# Patient Record
Sex: Male | Born: 1945
Health system: Southern US, Community
[De-identification: ages and names within clinical notes are randomized; demographics above are authoritative.]

## PROBLEM LIST (undated history)

## (undated) DIAGNOSIS — K219 Gastro-esophageal reflux disease without esophagitis: Secondary | ICD-10-CM

## (undated) DIAGNOSIS — E785 Hyperlipidemia, unspecified: Secondary | ICD-10-CM

## (undated) DIAGNOSIS — I10 Essential (primary) hypertension: Secondary | ICD-10-CM

## (undated) DIAGNOSIS — I2510 Atherosclerotic heart disease of native coronary artery without angina pectoris: Secondary | ICD-10-CM

## (undated) DIAGNOSIS — I6529 Occlusion and stenosis of unspecified carotid artery: Secondary | ICD-10-CM

## (undated) DIAGNOSIS — I639 Cerebral infarction, unspecified: Secondary | ICD-10-CM

## (undated) DIAGNOSIS — E669 Obesity, unspecified: Secondary | ICD-10-CM

## (undated) HISTORY — PX: TONSILLECTOMY: SHX5217

## (undated) HISTORY — DX: Occlusion and stenosis of unspecified carotid artery: I65.29

## (undated) HISTORY — PX: CORONARY ARTERY BYPASS GRAFT: SHX141

## (undated) HISTORY — DX: Cerebral infarction, unspecified: I63.9

## (undated) HISTORY — DX: Atherosclerotic heart disease of native coronary artery without angina pectoris: I25.10

## (undated) HISTORY — DX: Hyperlipidemia, unspecified: E78.5

## (undated) HISTORY — DX: Obesity, unspecified: E66.9

## (undated) HISTORY — DX: Gastro-esophageal reflux disease without esophagitis: K21.9

---

## 2006-03-26 ENCOUNTER — Inpatient Hospital Stay (HOSPITAL_COMMUNITY): Admission: EM | Admit: 2006-03-26 | Discharge: 2006-04-01 | Payer: Self-pay | Admitting: Emergency Medicine

## 2006-03-26 ENCOUNTER — Encounter (INDEPENDENT_AMBULATORY_CARE_PROVIDER_SITE_OTHER): Payer: Self-pay | Admitting: Cardiology

## 2006-03-26 ENCOUNTER — Ambulatory Visit: Payer: Self-pay | Admitting: Physical Medicine & Rehabilitation

## 2006-03-26 ENCOUNTER — Ambulatory Visit: Payer: Self-pay | Admitting: Pulmonary Disease

## 2006-04-01 ENCOUNTER — Ambulatory Visit: Payer: Self-pay | Admitting: Physical Medicine & Rehabilitation

## 2006-04-01 ENCOUNTER — Inpatient Hospital Stay (HOSPITAL_COMMUNITY)
Admission: RE | Admit: 2006-04-01 | Discharge: 2006-04-12 | Payer: Self-pay | Admitting: Physical Medicine & Rehabilitation

## 2006-05-05 ENCOUNTER — Encounter
Admission: RE | Admit: 2006-05-05 | Discharge: 2006-08-03 | Payer: Self-pay | Admitting: Physical Medicine & Rehabilitation

## 2006-05-05 ENCOUNTER — Ambulatory Visit: Payer: Self-pay | Admitting: Physical Medicine & Rehabilitation

## 2006-07-06 ENCOUNTER — Ambulatory Visit (HOSPITAL_COMMUNITY): Admission: RE | Admit: 2006-07-06 | Discharge: 2006-07-06 | Payer: Self-pay | Admitting: Interventional Radiology

## 2006-08-15 ENCOUNTER — Encounter
Admission: RE | Admit: 2006-08-15 | Discharge: 2006-11-13 | Payer: Self-pay | Admitting: Physical Medicine & Rehabilitation

## 2006-08-15 ENCOUNTER — Ambulatory Visit: Payer: Self-pay | Admitting: Physical Medicine & Rehabilitation

## 2009-01-09 ENCOUNTER — Encounter: Payer: Self-pay | Admitting: Cardiology

## 2009-05-12 ENCOUNTER — Encounter: Payer: Self-pay | Admitting: Cardiology

## 2009-06-19 ENCOUNTER — Encounter: Payer: Self-pay | Admitting: Cardiology

## 2009-06-20 ENCOUNTER — Ambulatory Visit: Payer: Self-pay | Admitting: Cardiovascular Disease

## 2009-06-20 ENCOUNTER — Ambulatory Visit: Payer: Self-pay | Admitting: Cardiology

## 2009-06-20 ENCOUNTER — Inpatient Hospital Stay (HOSPITAL_COMMUNITY): Admission: AD | Admit: 2009-06-20 | Discharge: 2009-07-02 | Payer: Self-pay | Admitting: Cardiology

## 2009-06-20 DIAGNOSIS — E785 Hyperlipidemia, unspecified: Secondary | ICD-10-CM | POA: Insufficient documentation

## 2009-06-20 DIAGNOSIS — K219 Gastro-esophageal reflux disease without esophagitis: Secondary | ICD-10-CM

## 2009-06-20 DIAGNOSIS — E669 Obesity, unspecified: Secondary | ICD-10-CM

## 2009-06-20 DIAGNOSIS — R079 Chest pain, unspecified: Secondary | ICD-10-CM | POA: Insufficient documentation

## 2009-06-20 DIAGNOSIS — Z8673 Personal history of transient ischemic attack (TIA), and cerebral infarction without residual deficits: Secondary | ICD-10-CM | POA: Insufficient documentation

## 2009-06-20 HISTORY — DX: Chest pain, unspecified: R07.9

## 2009-06-23 ENCOUNTER — Encounter: Payer: Self-pay | Admitting: Cardiothoracic Surgery

## 2009-06-23 ENCOUNTER — Encounter: Payer: Self-pay | Admitting: Cardiology

## 2009-06-23 ENCOUNTER — Ambulatory Visit: Payer: Self-pay | Admitting: Thoracic Surgery (Cardiothoracic Vascular Surgery)

## 2009-07-13 DIAGNOSIS — I25810 Atherosclerosis of coronary artery bypass graft(s) without angina pectoris: Secondary | ICD-10-CM | POA: Insufficient documentation

## 2009-07-14 ENCOUNTER — Ambulatory Visit: Payer: Self-pay | Admitting: Cardiology

## 2009-07-14 DIAGNOSIS — I4891 Unspecified atrial fibrillation: Secondary | ICD-10-CM | POA: Insufficient documentation

## 2009-07-14 LAB — CONVERTED CEMR LAB
AST: 41 units/L — ABNORMAL HIGH (ref 0–37)
BUN: 14 mg/dL (ref 6–23)
Basophils Absolute: 0.1 10*3/uL (ref 0.0–0.1)
Bilirubin, Direct: 0.1 mg/dL (ref 0.0–0.3)
Creatinine, Ser: 1.3 mg/dL (ref 0.4–1.5)
GFR calc non Af Amer: 58.99 mL/min (ref >60)
Glucose, Bld: 107 mg/dL — ABNORMAL HIGH (ref 70–99)
HCT: 34.7 % — ABNORMAL LOW (ref 39.0–52.0)
Lymphs Abs: 1 10*3/uL (ref 0.7–4.0)
Monocytes Absolute: 1 10*3/uL (ref 0.1–1.0)
Monocytes Relative: 7.4 % (ref 3.0–12.0)
Neutrophils Relative %: 83.5 % — ABNORMAL HIGH (ref 43.0–77.0)
Platelets: 377 10*3/uL (ref 150.0–400.0)
Potassium: 4.9 meq/L (ref 3.5–5.1)
RDW: 13.8 % (ref 11.5–14.6)
Total Bilirubin: 0.9 mg/dL (ref 0.3–1.2)

## 2009-07-18 ENCOUNTER — Inpatient Hospital Stay (HOSPITAL_COMMUNITY): Admission: AD | Admit: 2009-07-18 | Discharge: 2009-08-11 | Payer: Self-pay | Admitting: Cardiology

## 2009-07-18 ENCOUNTER — Ambulatory Visit: Payer: Self-pay | Admitting: Cardiology

## 2009-07-18 ENCOUNTER — Encounter: Payer: Self-pay | Admitting: Cardiology

## 2009-07-19 ENCOUNTER — Encounter: Payer: Self-pay | Admitting: Cardiology

## 2009-08-29 ENCOUNTER — Ambulatory Visit: Payer: Self-pay | Admitting: Thoracic Surgery (Cardiothoracic Vascular Surgery)

## 2009-08-29 ENCOUNTER — Encounter
Admission: RE | Admit: 2009-08-29 | Discharge: 2009-08-29 | Payer: Self-pay | Admitting: Thoracic Surgery (Cardiothoracic Vascular Surgery)

## 2009-08-29 ENCOUNTER — Encounter: Payer: Self-pay | Admitting: Cardiology

## 2009-09-08 ENCOUNTER — Ambulatory Visit: Payer: Self-pay | Admitting: Thoracic Surgery (Cardiothoracic Vascular Surgery)

## 2009-09-08 ENCOUNTER — Encounter: Payer: Self-pay | Admitting: Cardiology

## 2009-09-22 ENCOUNTER — Ambulatory Visit: Payer: Self-pay | Admitting: Thoracic Surgery (Cardiothoracic Vascular Surgery)

## 2009-09-22 ENCOUNTER — Ambulatory Visit: Payer: Self-pay | Admitting: Cardiology

## 2009-09-22 DIAGNOSIS — R21 Rash and other nonspecific skin eruption: Secondary | ICD-10-CM | POA: Insufficient documentation

## 2009-10-07 ENCOUNTER — Ambulatory Visit: Payer: Self-pay

## 2009-10-07 ENCOUNTER — Encounter: Payer: Self-pay | Admitting: Cardiology

## 2009-10-07 ENCOUNTER — Ambulatory Visit: Payer: Self-pay | Admitting: Cardiology

## 2009-10-07 ENCOUNTER — Ambulatory Visit (HOSPITAL_COMMUNITY): Admission: RE | Admit: 2009-10-07 | Discharge: 2009-10-07 | Payer: Self-pay | Admitting: Cardiology

## 2009-10-08 LAB — CONVERTED CEMR LAB
Albumin: 3.6 g/dL (ref 3.5–5.2)
Cholesterol: 135 mg/dL (ref 0–200)
LDL Cholesterol: 74 mg/dL (ref 0–99)
Total Protein: 7.5 g/dL (ref 6.0–8.3)
Triglycerides: 150 mg/dL — ABNORMAL HIGH (ref 0.0–149.0)
VLDL: 30 mg/dL (ref 0.0–40.0)

## 2009-10-31 ENCOUNTER — Telehealth: Payer: Self-pay | Admitting: Cardiology

## 2009-12-10 ENCOUNTER — Ambulatory Visit: Payer: Self-pay | Admitting: Cardiology

## 2010-04-29 ENCOUNTER — Telehealth (INDEPENDENT_AMBULATORY_CARE_PROVIDER_SITE_OTHER): Payer: Self-pay | Admitting: *Deleted

## 2010-09-17 ENCOUNTER — Encounter: Payer: Self-pay | Admitting: Cardiology

## 2010-09-17 LAB — PSA: PSA: 0.6 ng/mL

## 2010-09-25 ENCOUNTER — Encounter (INDEPENDENT_AMBULATORY_CARE_PROVIDER_SITE_OTHER): Payer: Self-pay | Admitting: *Deleted

## 2010-09-29 ENCOUNTER — Ambulatory Visit: Payer: Self-pay | Admitting: Cardiology

## 2010-10-12 ENCOUNTER — Telehealth: Payer: Self-pay | Admitting: Cardiology

## 2010-10-12 IMAGING — CR DG CHEST 1V PORT
1 series · 1 of 1 positions shown · non-contrast
Comparison: [DATE]

CLINICAL DATA: CABG

PORTABLE CHEST - 1 VIEW

[view not recorded]
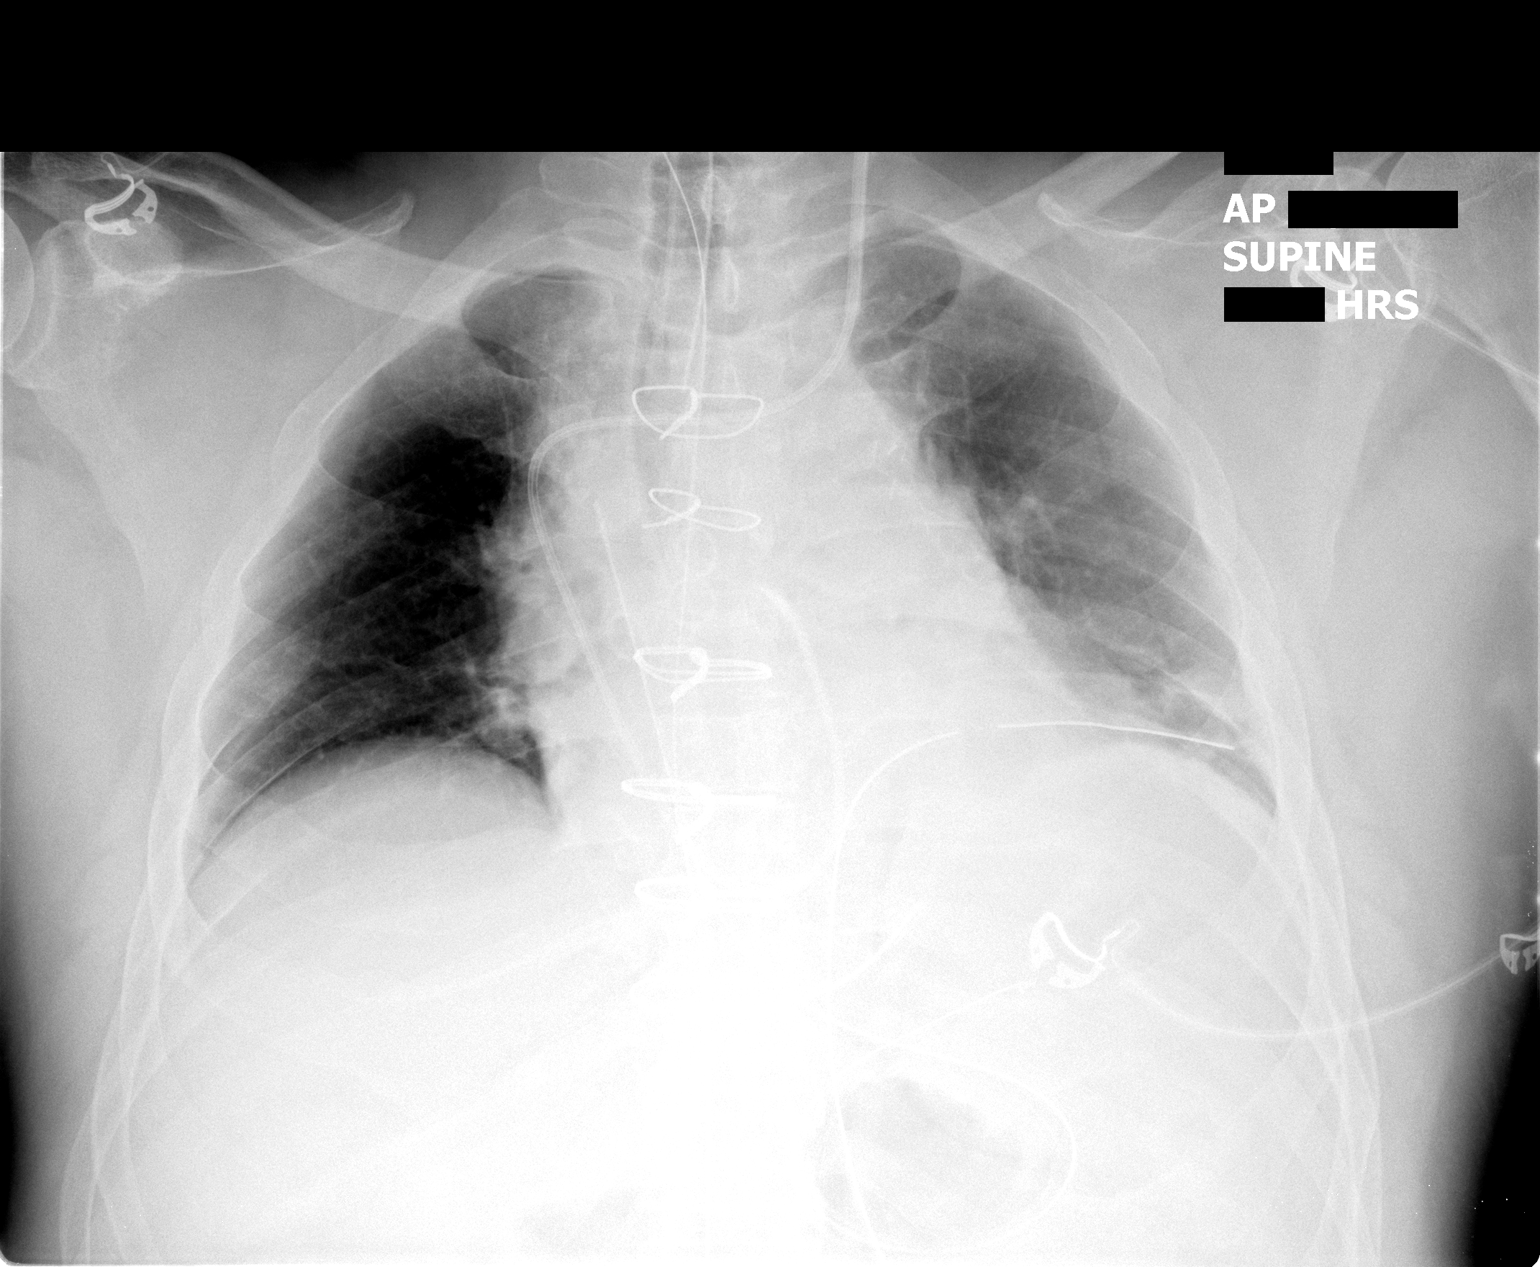

[1 of 1 positions shown; findings below may reference images not displayed]

FINDINGS: There is been median sternotomy and CABG.  Endotracheal
tube has its tip 3 cm above the carina.  Nasogastric tube enters
the stomach.  Swan-Ganz catheter has its tip in the main pulmonary
artery.  Left chest tubes in place.  There is no pneumothorax.
Mediastinal drains are in place.  There is mild atelectasis in the
left lower lobe.  No pulmonary edema.
IMPRESSION: Lines and tubes well positioned.  No pneumothorax.  Some volume
loss in the left lower lobe.

## 2010-10-12 IMAGING — CR DG CHEST 1V PORT
1 series · 1 of 1 positions shown · non-contrast
Comparison: 04/03/2006.

CLINICAL DATA: Status post cardiac catheterization.  Chest pain.

PORTABLE CHEST - 1 VIEW

[view not recorded]
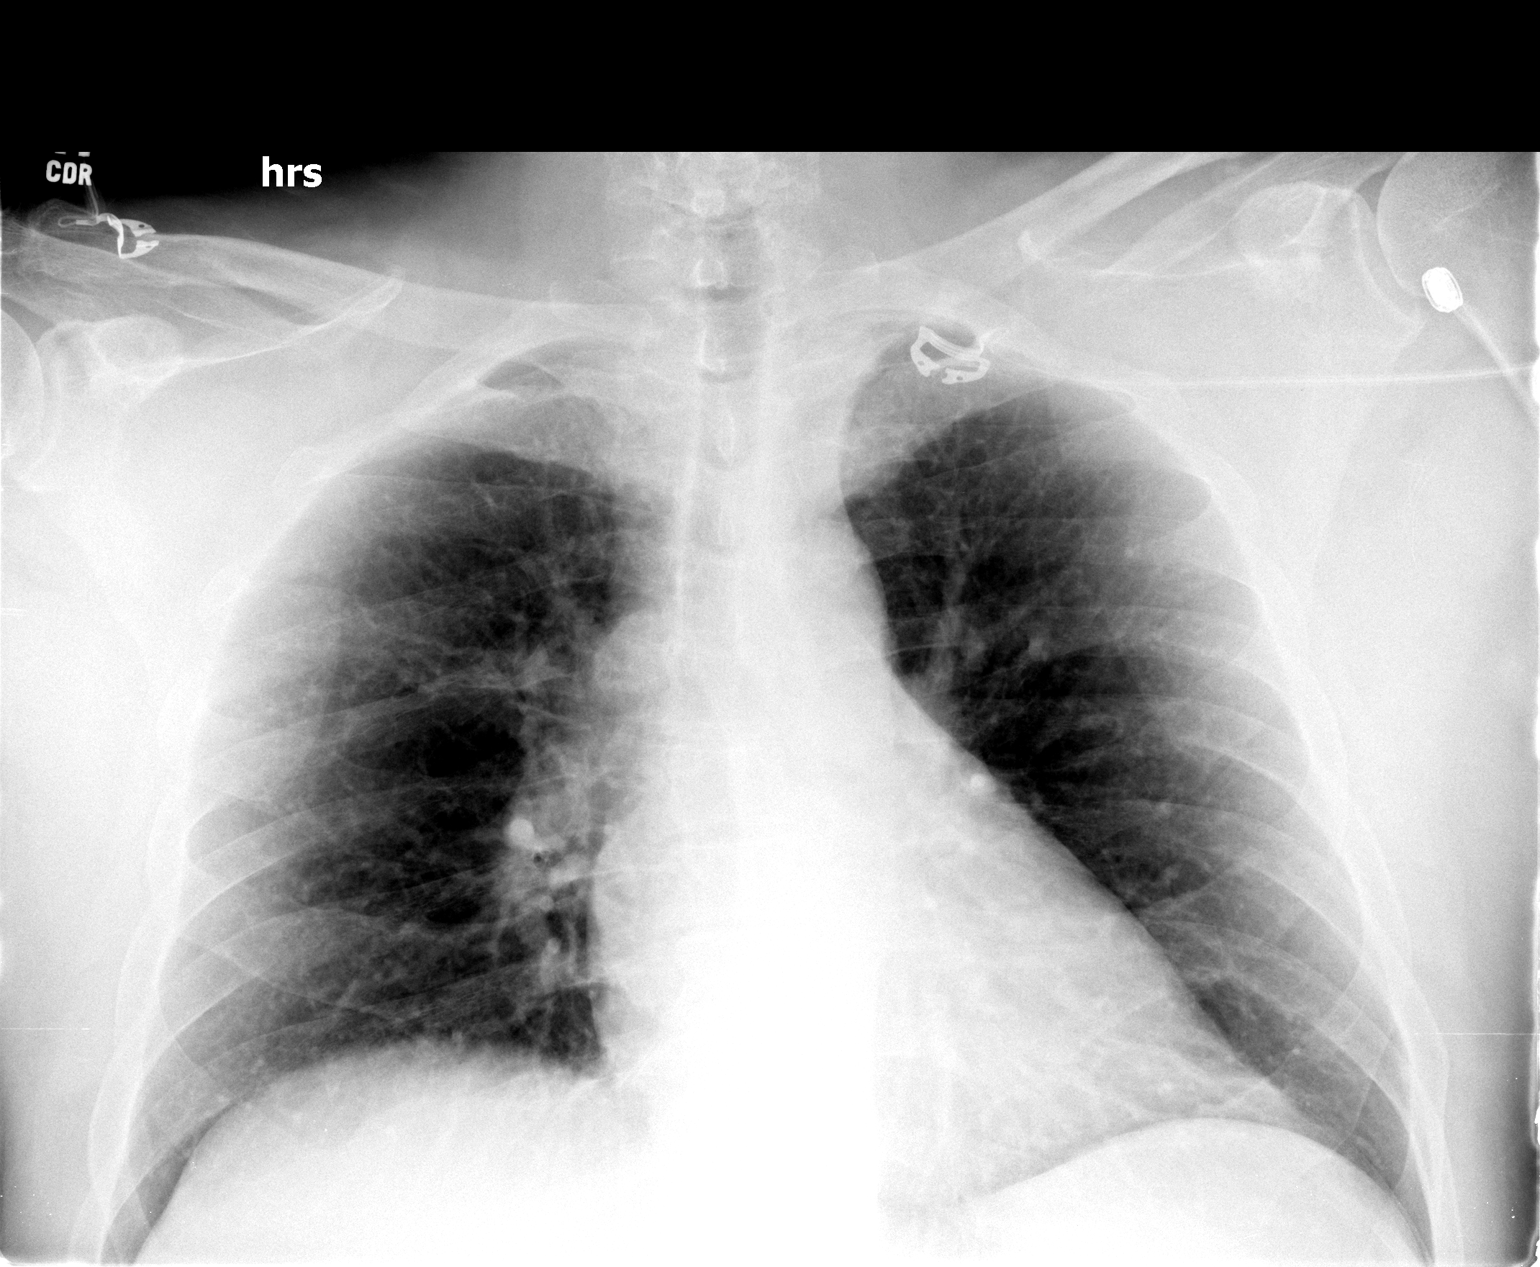

[1 of 1 positions shown; findings below may reference images not displayed]

FINDINGS: Central pulmonary vascular prominence.  No pulmonary
edema, segmental infiltrate or gross pneumothorax.  Heart size
within normal limits.  Prominence azygos region may represent
dilated azygos vein and can be evaluated on two-view chest with the
patient in the upright position when able.
IMPRESSION: Mild central pulmonary vascular prominence.

No infiltrate, congestive heart failure or pneumothorax. Please
see above.

## 2010-10-20 IMAGING — CR DG CHEST 2V
2 series · 2 of 2 positions shown · non-contrast
Comparison: Plain film chest 06/30/2009.

CLINICAL DATA: Chest pain.  Status post CABG.

CHEST - 2 VIEW

[w chest pa]
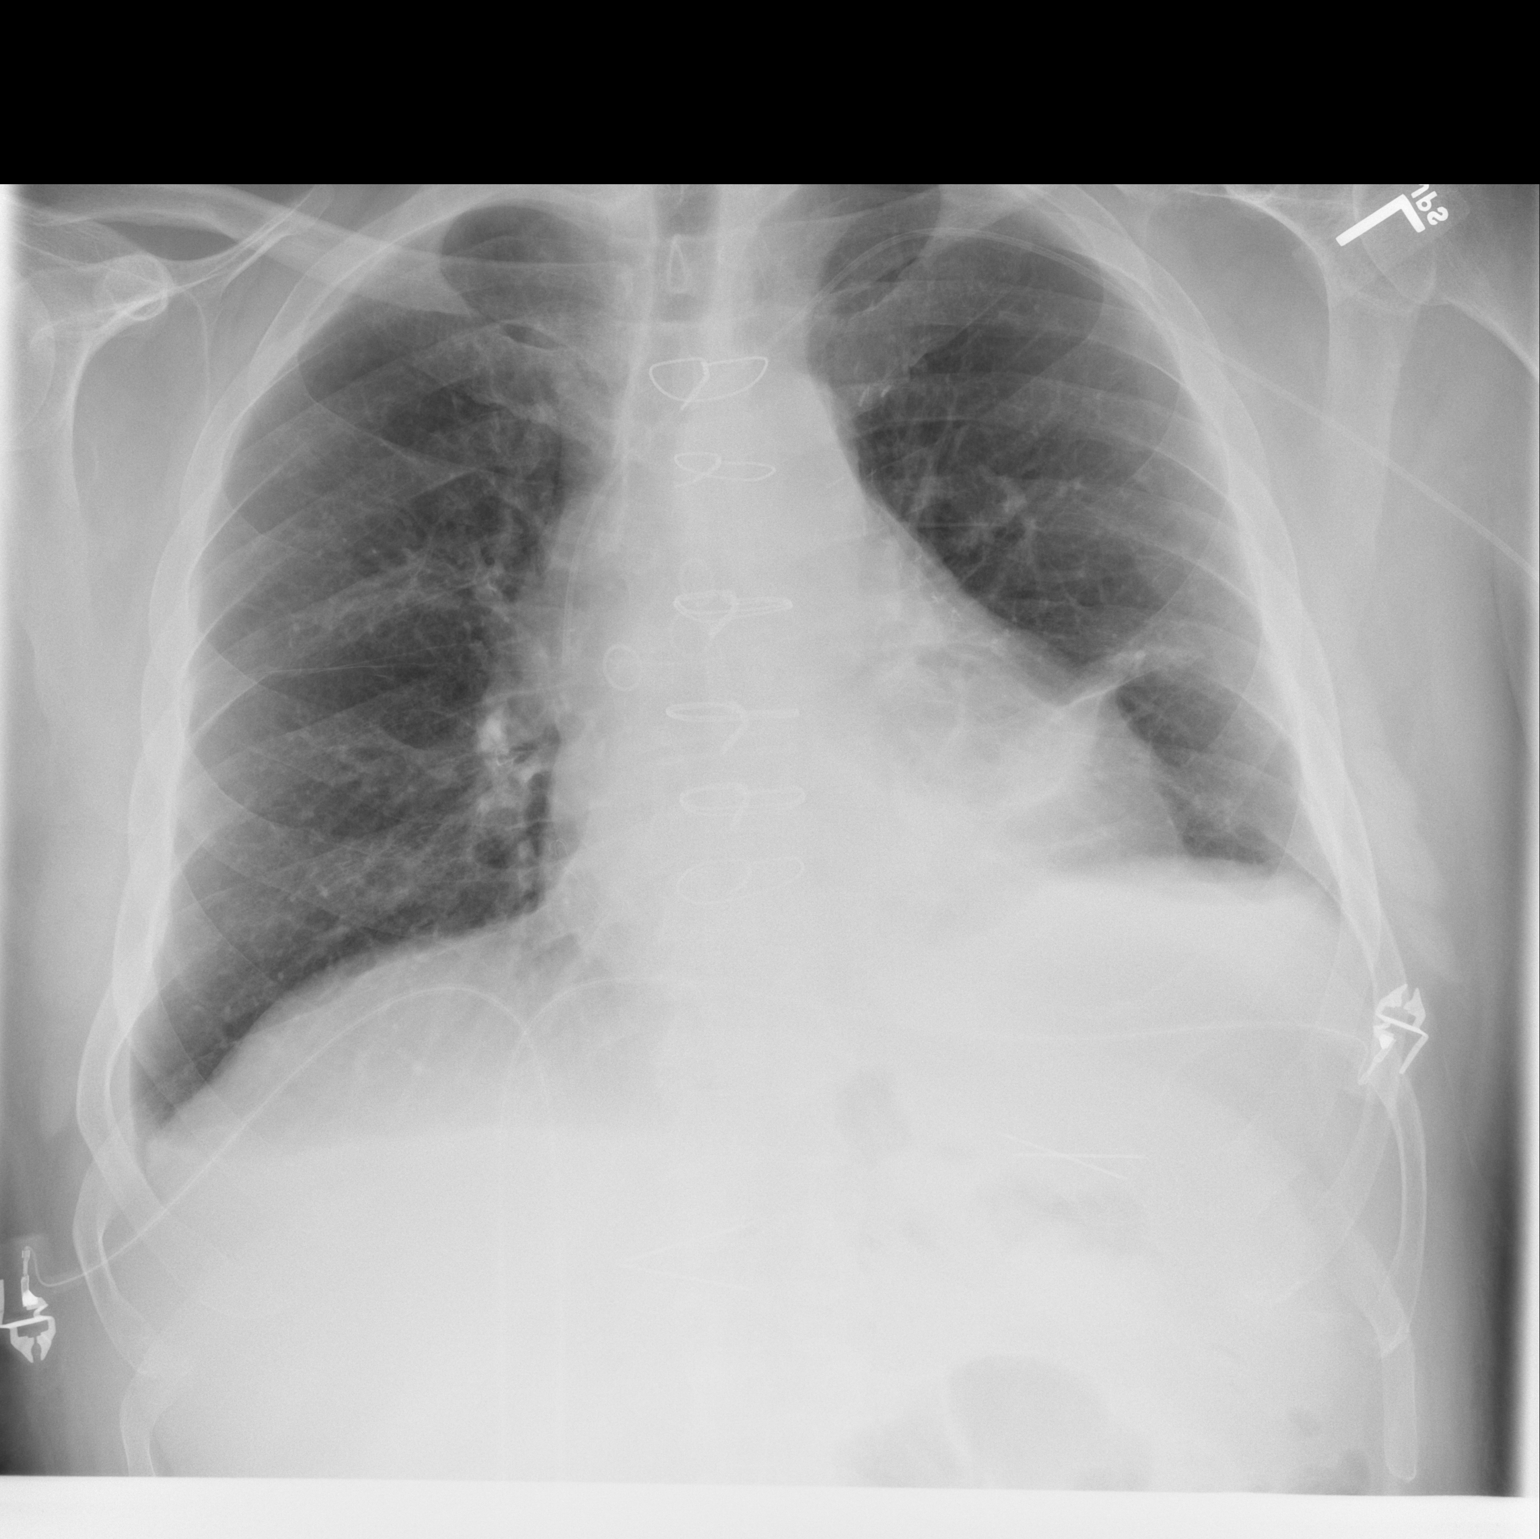

[w chest lat]
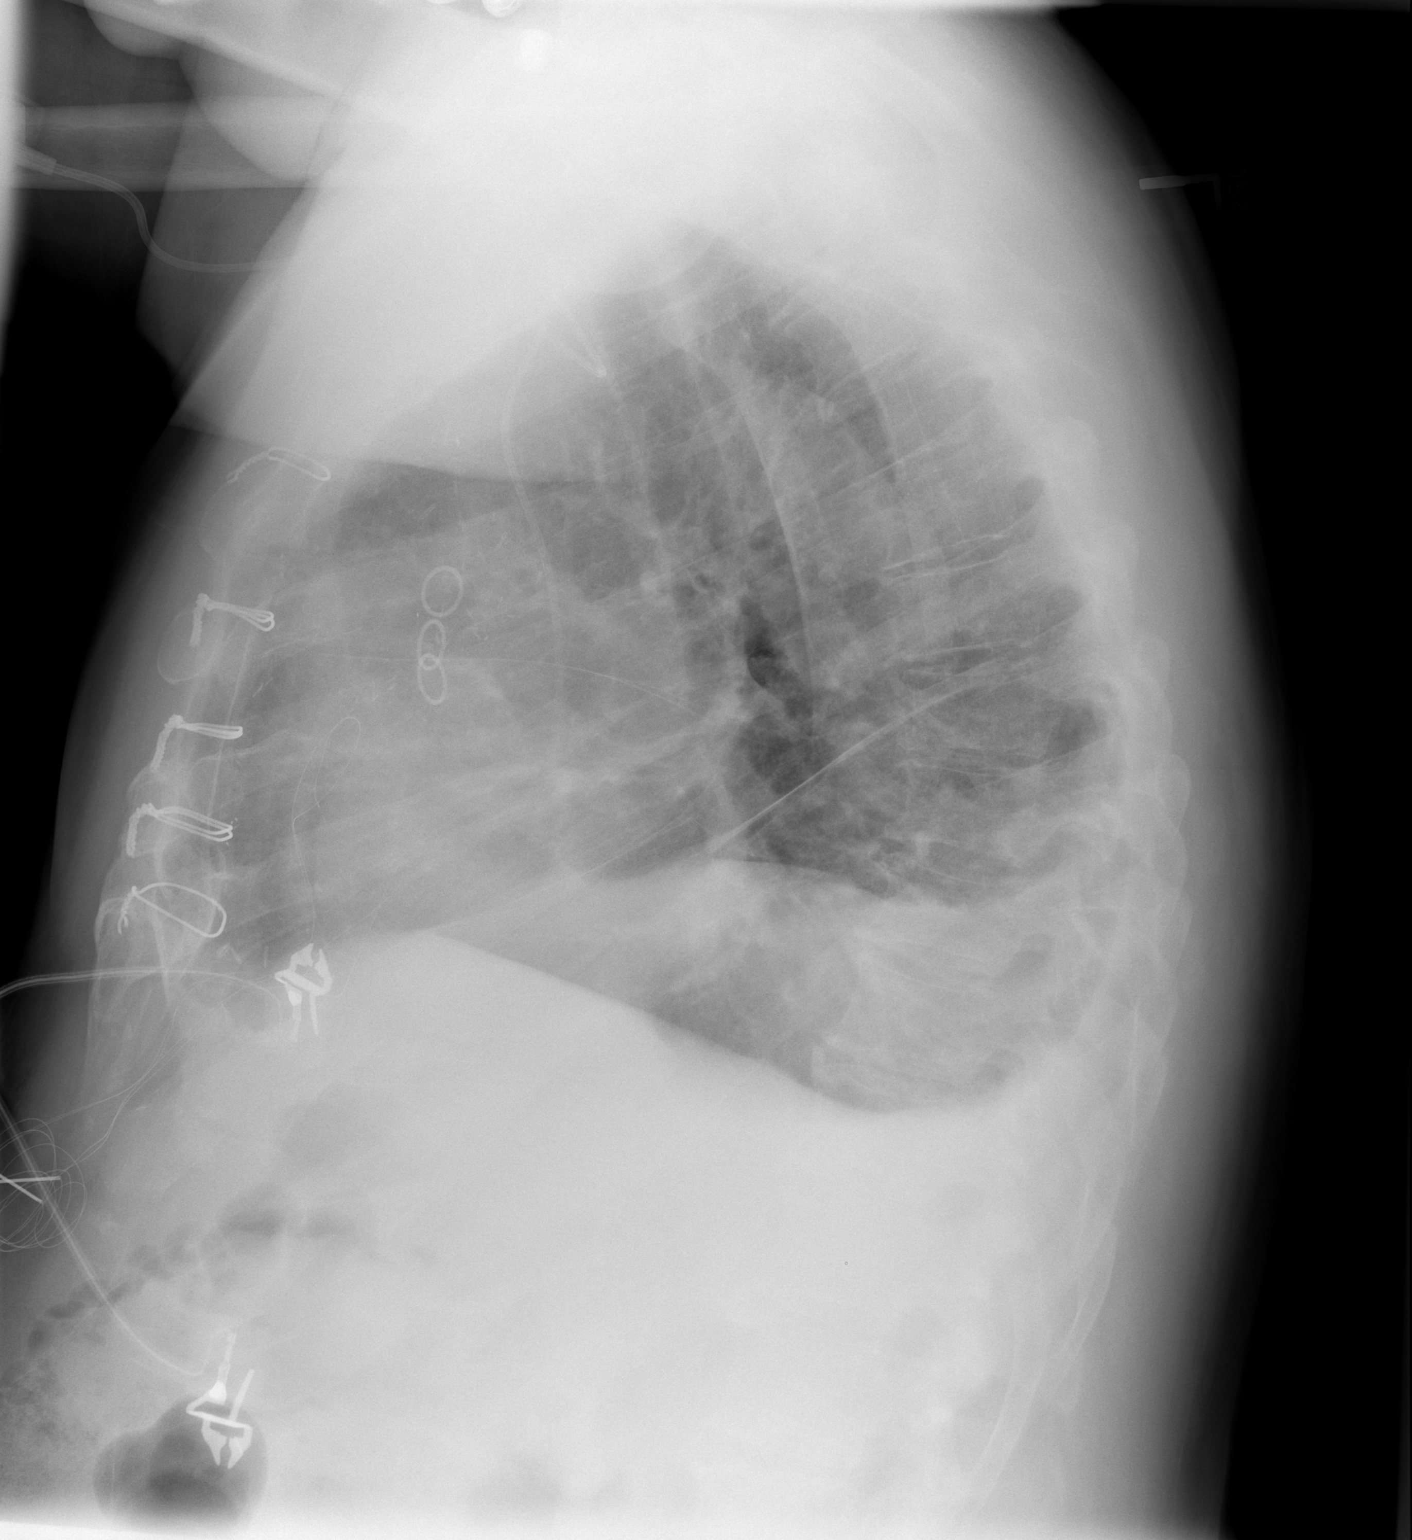

[2 of 2 positions shown; findings below may reference images not displayed]

FINDINGS: Left PICC remains in place.  The patient has small
bilateral pleural effusions and basilar atelectasis, both greater
on the left.  Heart size is upper normal.  No pulmonary edema.
IMPRESSION: Small bilateral pleural effusions and basilar atelectasis, greater
on the left.

## 2010-11-06 ENCOUNTER — Ambulatory Visit: Payer: Self-pay | Admitting: Gastroenterology

## 2010-11-06 ENCOUNTER — Encounter (INDEPENDENT_AMBULATORY_CARE_PROVIDER_SITE_OTHER): Payer: Self-pay | Admitting: *Deleted

## 2010-11-09 ENCOUNTER — Telehealth: Payer: Self-pay | Admitting: Gastroenterology

## 2010-11-09 IMAGING — CR DG CHEST 1V PORT
1 series · 1 of 1 positions shown · non-contrast
Comparison: 07/20/2009

CLINICAL DATA: CAD, sternal debridement.

PORTABLE CHEST - 1 VIEW

[view not recorded]
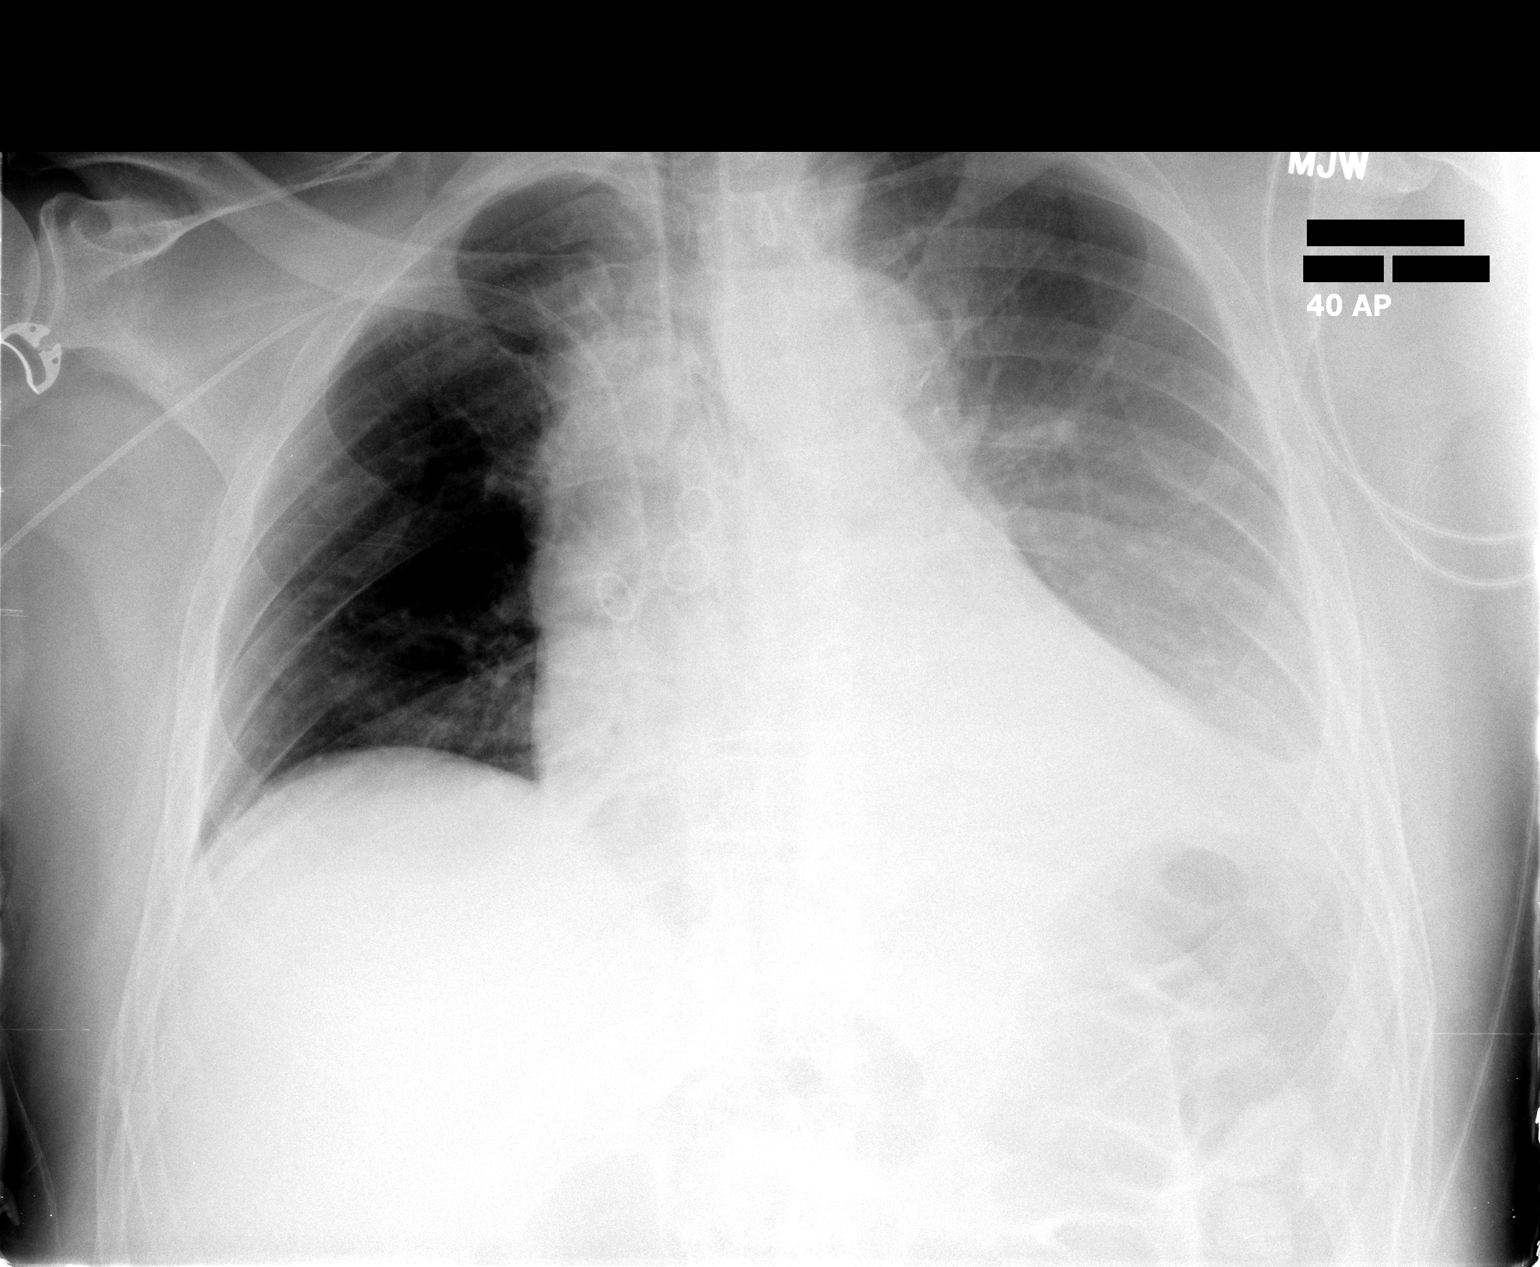

[1 of 1 positions shown; findings below may reference images not displayed]

FINDINGS: Right PICC line remains in place, unchanged.  There is
cardiomegaly.  Left lower lobe atelectasis or infiltrate present.
Small left effusion noted.  Right lung clear.  No significant
change since prior study.
IMPRESSION: No interval change.

## 2011-01-04 ENCOUNTER — Encounter: Payer: Self-pay | Admitting: Gastroenterology

## 2011-01-04 ENCOUNTER — Ambulatory Visit
Admission: RE | Admit: 2011-01-04 | Discharge: 2011-01-04 | Payer: Self-pay | Source: Home / Self Care | Attending: Gastroenterology | Admitting: Gastroenterology

## 2011-01-15 ENCOUNTER — Encounter: Payer: Self-pay | Admitting: Gastroenterology

## 2011-01-26 NOTE — Progress Notes (Signed)
Summary: RX REFILL  Phone Note Refill Request Message from:  Patient on October 12, 2010 9:51 AM  Refills Requested: Medication #1:  SIMVASTATIN 20 MG TABS 1 tab once daily [facility] # [id number]   Method Requested: Telephone to Pharmacy Initial call taken by: Roe Coombs,  October 12, 2010 9:51 AM  Follow-up for Phone Call        I called the pt and he would like #90 tablets. I have made him aware I would send this in. Follow-up by: Sherri Rad, RN, BSN,  October 12, 2010 12:12 PM    Prescriptions: SIMVASTATIN 20 MG TABS (SIMVASTATIN) 1 tab once daily  #90 x 3   Entered by:   Sherri Rad, RN, BSN   Authorized by:   Lenoria Farrier, MD, [facility]   Signed by:   Sherri Rad, RN, BSN on 10/12/2010   Method used:   Electronically to        [facility] (retail)       [address]       [place], [place]  [address]       Ph: [phone]       Fax: [phone]   RxID:   [id number]

## 2011-01-26 NOTE — Letter (Signed)
Summary: Anticoagulation/[facility]  Anticoagulation/[facility]   Imported By: Sherian Rein 11/13/2010 14:28:38  _____________________________________________________________________  External Attachment:    Type:   Image     Comment:   External Document

## 2011-01-26 NOTE — Assessment & Plan Note (Signed)
History of Present Illness Visit Type: Initial Consult Primary GI MD: Rob Bunting MD Primary Provider: Ancil Linsey Requesting Provider: Rudi Heap, MD Chief Complaint: Discuss having Colonoscopy. Pt is currently on Plavix. History of Present Illness:     very pleasant 65 year old man was sent by PCP to discuss colonoscopy. He has no troubles with bowels.  no fh of colon cancers.  2007 he had stroke.  he eventually had a carotid stent placed in his right neck. He has also had coronary artery bypass grafting in 2010.  he takes plavix since 2007.  He has not stopped it for any reason previously.             Current Medications (verified): 1)  Aspirin 81 Mg Tbec (Aspirin) .... Take One Tablet By Mouth Daily 2)  Simvastatin 20 Mg Tabs (Simvastatin) .Marland Kitchen.. 1 Tab Once Daily 3)  Metoprolol Succinate 25 Mg Xr24h-Tab (Metoprolol Succinate) .... Take One Tablet By Mouth Daily 4)  Citalopram Hydrobromide 40 Mg Tabs (Citalopram Hydrobromide) .... 1/2 By Mouth Daily 5)  Plavix 75 Mg Tabs (Clopidogrel Bisulfate) .... One Tablet By Mouth Once Daily 6)  Vitamin D3 400 Unit Tabs (Cholecalciferol) .... One Tablet By Mouth Once Daily  Allergies (verified): 1)  ! Lipitor (Atorvastatin) 2)  ! * Ivp Dye  Past History:  Past Medical History: . History of previous stroke and right carotid stenting in 2007 2. CAD S/P NSTEMI and ECABGS for LM and 3v CAD 06/2009 3. Staph mediastinal infection requiring surgery 07/2009 4.  Hyperlipidemia 5.  Post-op AF   Past Surgical History: coronary artery bypass grafting  Family History: his father and brother both had myocardial infarctions.    Social History: Tobacco Use - Former. 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 He is married, 2 children  Review of Systems       Pertinent positive and negative review of systems were noted in the above HPI and GI specific review of systems.  All other review of  systems was otherwise negative.   Vital Signs:  Patient profile:   65 year old male Height:      70 inches Weight:      229.50 pounds BMI:     33.05 Pulse rate:   76 / minute Pulse rhythm:   regular BP sitting:   136 / 78  (left arm) Cuff size:   regular  Vitals Entered By: Christie Nottingham CMA Duncan Dull) (November 06, 2010 10:19 AM)  Physical Exam  Additional Exam:  Constitutional: generally well appearing Psychiatric: alert and oriented times 3 Eyes: extraocular movements intact Mouth: oropharynx moist, no lesions Neck: supple, no lymphadenopathy Cardiovascular: heart regular rate and rythm Lungs: CTA bilaterally Abdomen: soft, non-tender, non-distended, no obvious ascites, no peritoneal signs, normal bowel sounds Extremities: no lower extremity edema bilaterally Skin: no lesions on visible extremities    Impression & Recommendations:  Problem # 1:  routine risk for colon cancer he takes Plavix since a stroke in 2007. This puts him at increased risk for bleeding complications during any procedures and so we will contact his primary care physician about him holding the Plavix for 5 days prior to scheduling a colonoscopy. I see no reason for any further blood tests or imaging studies.  Patient Instructions: 1)  We will contact Birdena Jubilee at Dr. Kathi Der office about you holding your plavix for 5 days prior to colonoscopy. 2)  You will be scheduled to have a colonoscopy. 3)  The medication list was reviewed and reconciled.  All changed / newly prescribed medications were explained.  A complete medication list was provided to the patient / caregiver.  Appended Document: Orders Update/movi    Clinical Lists Changes  Problems: Added new problem of SPECIAL SCREENING FOR MALIGNANT NEOPLASMS COLON (ICD-V76.51) Medications: Added new medication of MOVIPREP 100 GM  SOLR (PEG-KCL-NACL-NASULF-NA ASC-C) As per prep instructions. - Signed Rx of MOVIPREP 100 GM  SOLR  (PEG-KCL-NACL-NASULF-NA ASC-C) As per prep instructions.;  #1 x 0;  Signed;  Entered by: Chales Abrahams CMA (AAMA);  Authorized by: Rachael Fee MD;  Method used: Electronically to [facility] [address], [place], [place], [place]  [address], Ph: [phone], Fax: [phone] Orders: Added new Test order of Colonoscopy (Colon) - Signed    Prescriptions: MOVIPREP 100 GM  SOLR (PEG-KCL-NACL-NASULF-NA ASC-C) As per prep instructions.  #1 x 0   Entered by:   Chales Abrahams CMA (AAMA)   Authorized by:   Rachael Fee MD   Signed by:   Chales Abrahams CMA (AAMA) on 11/06/2010   Method used:   Electronically to        [facility] (retail)       [address]       [place], [place]  [address]       Ph: [phone]       Fax: [phone]   RxID:   [id number]

## 2011-01-26 NOTE — Letter (Signed)
Summary: [facility] Instructions  [facility]  [address] [place], [place] [address]   Phone: [phone]  Fax: [phone]       [name]    Aug 09, 1946    MRN: [id number]        Procedure Day /Date:01/04/11 MON     Arrival Time:1030 am     Procedure Time:1130 am     Location of Procedure:                    X  [facility] (4th Floor)                        PREPARATION FOR COLONOSCOPY WITH MOVIPREP   Starting 5 days prior to your procedure 12/30/10  do not eat nuts, seeds, popcorn, corn, beans, peas,  salads, or any raw vegetables.  Do not take any fiber supplements (e.g. Metamucil, Citrucel, and Benefiber).  THE DAY BEFORE YOUR PROCEDURE         DATE: 01/03/11  DAY: SUN  1.  Drink clear liquids the entire day-NO SOLID FOOD  2.  Do not drink anything colored red or purple.  Avoid juices with pulp.  No orange juice.  3.  Drink at least 64 oz. (8 glasses) of fluid/clear liquids during the day to prevent dehydration and help the prep work efficiently.  CLEAR LIQUIDS INCLUDE: Water Jello Ice Popsicles Tea (sugar ok, no milk/cream) Powdered fruit flavored drinks Coffee (sugar ok, no milk/cream) Gatorade Juice: apple, white grape, white cranberry  Lemonade Clear bullion, consomm, broth Carbonated beverages (any kind) Strained chicken noodle soup Hard Candy                             4.  In the morning, mix first dose of MoviPrep solution:    Empty 1 Pouch A and 1 Pouch B into the disposable container    Add lukewarm drinking water to the top line of the container. Mix to dissolve    Refrigerate (mixed solution should be used within 24 hrs)  5.  Begin drinking the prep at 5:00 p.m. The MoviPrep container is divided by 4 marks.   Every 15 minutes drink the solution down to the next mark (approximately 8 oz) until the full liter is complete.   6.  Follow completed prep with 16 oz of clear liquid of your choice (Nothing red or purple).   Continue to drink clear liquids until bedtime.  7.  Before going to bed, mix second dose of MoviPrep solution:    Empty 1 Pouch A and 1 Pouch B into the disposable container    Add lukewarm drinking water to the top line of the container. Mix to dissolve    Refrigerate  THE DAY OF YOUR PROCEDURE      DATE: 01/04/11  DAY: MON  Beginning at 630 a.m. (5 hours before procedure):         1. Every 15 minutes, drink the solution down to the next mark (approx 8 oz) until the full liter is complete.  2. Follow completed prep with 16 oz. of clear liquid of your choice.    3. You may drink clear liquids until 930 am (2 HOURS BEFORE PROCEDURE).   MEDICATION INSTRUCTIONS  Unless otherwise instructed, you should take regular prescription medications with a small sip of water   as early as possible the morning of your procedure.  Stop taking Plavix or Aggrenox on  12/28/10  (7 days before procedure).    We have contacted [name].    Additional medication instructions: You will be contacted by our office prior to your procedure for directions on holding your Plavix.  If you do not hear from our office 1 week prior to your scheduled procedure, please call [phone] to discuss.          OTHER INSTRUCTIONS  You will need a responsible adult at least 66 years of age to accompany you and drive you home.   This person must remain in the waiting room during your procedure.  Wear loose fitting clothing that is easily removed.  Leave jewelry and other valuables at home.  However, you may wish to bring a book to read or  an iPod/MP3 player to listen to music as you wait for your procedure to start.  Remove all body piercing jewelry and leave at home.  Total time from sign-in until discharge is approximately 2-3 hours.  You should go home directly after your procedure and rest.  You can resume normal activities the  day after your procedure.  The day of your procedure you should  not:   Drive   Make legal decisions   Operate machinery   Drink alcohol   Return to work  You will receive specific instructions about eating, activities and medications before you leave.    The above instructions have been reviewed and explained to me by   _______________________    I fully understand and can verbalize these instructions _____________________________ Date _________

## 2011-01-26 NOTE — Letter (Signed)
Summary: Anticoagulation Modification Letter  [facility]  [address] [place], [place] [address]   Phone: [phone]  Fax: [phone]    November 06, 2010  Re:    [name] DOB:    1946/12/25 MRN:    [id number]    Dear [name],  We have scheduled the above patient for an endoscopic procedure. Our records show that  he/she is on anticoagulation therapy. Please advise as to how long the patient may come off their therapy of plavix prior to the scheduled procedure(s) on 01/04/2011.   Please fax back/or route the completed form to Patty at  [phone]  Thank you for your help with this matter.  Sincerely,  Chales Abrahams CMA Duncan Dull)   Physician Recommendation:  Hold Plavix 7 days prior ________________  Hold Coumadin 5 days prior ____________  Other ______________________________     Appended Document: Anticoagulation Modification Letter response recieved pt aware  letter scanned to EMR

## 2011-01-26 NOTE — Letter (Signed)
Summary: New Patient letter  [facility]  [address] [place], [place] [address]   Phone: [phone]  Fax: [phone]       09/25/2010 MRN: [id number]  [facility] [address] [place], [place]  [address]  Dear Mr. Stockard,  Welcome to the Gastroenterology Division at [facility].    You are scheduled to see Dr.  Christella Hartigan on 11-06-10 at 11:00a.m. on the 3rd floor at [facility], [address]  We ask that you try to arrive at our office 15 minutes prior to your appointment time to allow for check-in.  We would like you to complete the enclosed self-administered evaluation form prior to your visit and bring it with you on the day of your appointment.  We will review it with you.  Also, please bring a complete list of all your medications or, if you prefer, bring the medication bottles and we will list them.  Please bring your insurance card so that we may make a copy of it.  If your insurance requires a referral to see a specialist, please bring your referral form from your primary care physician.  Co-payments are due at the time of your visit and may be paid by cash, check or credit card.     Your office visit will consist of a consult with your physician (includes a physical exam), any laboratory testing he/she may order, scheduling of any necessary diagnostic testing (e.g. x-ray, ultrasound, CT-scan), and scheduling of a procedure (e.g. Endoscopy, Colonoscopy) if required.  Please allow enough time on your schedule to allow for any/all of these possibilities.    If you cannot keep your appointment, please call [phone] to cancel or reschedule prior to your appointment date.  This allows [place] the opportunity to schedule an appointment for another patient in need of care.  If you do not cancel or reschedule by 5 p.m. the business day prior to your appointment date, you will be charged a $50.00 late cancellation/no-show fee.    Thank you for choosing  [facility] for your medical needs.  We appreciate the opportunity to care for you.  Please visit [place] at our website  to learn more about our practice.                     Sincerely,                                                             The Gastroenterology Division

## 2011-01-26 NOTE — Progress Notes (Signed)
Summary: prep request  Phone Note Call from Patient Call back at Home Phone [phone]   Caller: Patient Call For: Dr. Christella Hartigan Reason for Call: Talk to Nurse Summary of Call: [facility] in [place]... would like a cheaper prep Initial call taken by: Vallarie Mare,  November 09, 2010 8:48 AM  Follow-up for Phone Call        pt to pick up sample Movi prep at front desk Follow-up by: Chales Abrahams CMA Duncan Dull),  November 09, 2010 8:57 AM

## 2011-01-26 NOTE — Progress Notes (Signed)
Summary: refill  Phone Note Refill Request Message from:  Patient on Apr 29, 2010 4:12 PM  Refills Requested: Medication #1:  PLAVIX 75 MG TABS 1 tab once daily   Supply Requested: 3 months [facility] [place]   Method Requested: Fax to Local Pharmacy Initial call taken by: Migdalia Dk,  Apr 29, 2010 4:13 PM  Follow-up for Phone Call        Rx faxed to pharmacy Follow-up by: Oswald Hillock,  Apr 29, 2010 4:15 PM    Prescriptions: PLAVIX 75 MG TABS (CLOPIDOGREL BISULFATE) 1 tab once daily  #30 x 6   Entered by:   Oswald Hillock   Authorized by:   Lenoria Farrier, MD, [facility]   Signed by:   Oswald Hillock on 04/29/2010   Method used:   Faxed to ...       [facility] (retail)       [address]       [place], [place]  [address]       Ph: [phone]       Fax: [phone]   RxID:   [id number]

## 2011-01-26 NOTE — Assessment & Plan Note (Signed)
Summary: rov   Primary Langford Carias:  [name]   History of Present Illness: [name] is 65 years old and return for management of CAD. In July of 2010 he had a non-ST elevation MI with CABG 4 he developed a mediastinal infection requiring repeat surgery. He's done quite well since that time and hasn't had chest pain shortness of breath or palpitations.  in 2007 he had a carotid stent to the right carotid. Had a mild stroke before that. He has very little residual. He also has hyperlipidemia.  Current Medications (verified): 1)  Plavix 75 Mg Tabs (Clopidogrel Bisulfate) .Marland Kitchen.. 1 Tab Once Daily 2)  Aspirin 81 Mg Tbec (Aspirin) .... Take One Tablet By Mouth Daily 3)  Simvastatin 20 Mg Tabs (Simvastatin) .Marland Kitchen.. 1 Tab Once Daily 4)  Metoprolol Succinate 25 Mg Xr24h-Tab (Metoprolol Succinate) .... Take One Tablet By Mouth Daily 5)  Citalopram Hydrobromide 40 Mg Tabs (Citalopram Hydrobromide) .... 1/2 By Mouth Daily  Allergies (verified): 1)  ! Lipitor (Atorvastatin) 2)  ! * Ivp Dye  Past History:  Past Medical History: Reviewed history from 12/09/2009 and no changes required. CHEST PAIN-UNSPECIFIED (ICD-786.50) OBESITY (ICD-278.00) ACID REFLUX DISEASE (ICD-530.81)   1. History of previous stroke and right carotid stenting in 2007 2. CAD S/P NSTEMI and ECABGS for LM and 3v CAD 06/2009 3. Staph mediastinal infection requiring surgery 07/2009 4.  Hyperlipidemia 5.  Post-op AF  Review of Systems       ROS is negative except as outlined in HPI.   Vital Signs:  Patient profile:   65 year old male Height:      70 inches Weight:      227 pounds BMI:     32.69 Pulse rate:   74 / minute Resp:     16 per minute BP sitting:   107 / 63  (right arm)  Vitals Entered By: Marrion Coy, CNA (September 29, 2010 1:35 PM)  Physical Exam  Additional Exam:  Gen. Well-nourished, in no distress   Neck: No JVD, thyroid not enlarged, no carotid bruits Lungs: No tachypnea, clear without rales,  rhonchi or wheezes Cardiovascular: Rhythm regular, PMI not displaced,  heart sounds  normal, no murmurs or gallops, no peripheral edema, pulses normal in all 4 extremities. Abdomen: BS normal, abdomen soft and non-tender without masses or organomegaly, no hepatosplenomegaly. MS: No deformities, no cyanosis or clubbing   Neuro:  No focal sns   Skin:  no lesions    Impression & Recommendations:  Problem # 1:  CAD, AUTOLOGOUS BYPASS GRAFT (ICD-414.02) He had bypass surgery in July of 2010. He's had no chest pain this prompted stable. He is on Plavix for 2 reasons. He had acute cardiac syndrome prior to his bypass surgery. He also had a carotid stent in 2007. I think at this point the Plavix can be discontinued. We will continue aspirin.  The following medications were removed from the medication list:    Plavix 75 Mg Tabs (Clopidogrel bisulfate) .Marland Kitchen... 1 tab once daily His updated medication list for this problem includes:    Aspirin 81 Mg Tbec (Aspirin) .Marland Kitchen... Take one tablet by mouth daily    Metoprolol Succinate 25 Mg Xr24h-tab (Metoprolol succinate) .Marland Kitchen... Take one tablet by mouth daily  Orders: EKG w/ Interpretation (93000)  Problem # 2:  HYPERLIPIDEMIA-MIXED (ICD-272.4) This is managed with simvastatin and he had a recent lipid profile with his primary care physician. His updated medication list for this problem includes:    Simvastatin  20 Mg Tabs (Simvastatin) .Marland Kitchen... 1 tab once daily  Problem # 3:  CVA (ICD-434.91) He had a previous stroke and was treated with TPA and stent in 2007. He has very little residual. His been on aspirin and Plavix since that time but I don't think he needs the Plavix from this point forward. The following medications were removed from the medication list:    Plavix 75 Mg Tabs (Clopidogrel bisulfate) .Marland Kitchen... 1 tab once daily His updated medication list for this problem includes:    Aspirin 81 Mg Tbec (Aspirin) .Marland Kitchen... Take one tablet by mouth daily  Patient  Instructions: 1)  Stop Plavix. 2)  Your physician wants you to follow-up in:  1 year with Dr. Antoine Poche in our [place] office. You will receive a reminder letter in the mail two months in advance. If you don't receive a letter, please call our office to schedule the follow-up appointment.

## 2011-01-28 NOTE — Procedures (Addendum)
Summary: Colonoscopy  Patient: [name] Note: All result statuses are Final unless otherwise noted.  Tests: (1) Colonoscopy (COL)   COL Colonoscopy           DONE     [facility]     [address]     [place], [place]  [address]           COLONOSCOPY PROCEDURE REPORT           PATIENT:  [name], [name]  MR#:  [id number]     BIRTHDATE:  1946/05/01, 64 yrs. old  GENDER:  male     ENDOSCOPIST:  Rachael Fee, MD     REF. BY:  Rudi Heap, M.D.     PROCEDURE DATE:  01/04/2011     PROCEDURE:  Colonoscopy with biopsy     ASA CLASS:  Class II     INDICATIONS:  Routine Risk Screening     MEDICATIONS:   Fentanyl 50 mcg IV, Versed 6 mg IV           DESCRIPTION OF PROCEDURE:   After the risks benefits and     alternatives of the procedure were thoroughly explained, informed     consent was obtained.  Digital rectal exam was performed and     revealed no rectal masses.   The LB [id number]-[id number] endoscope     was introduced through the anus and advanced to the cecum, which     was identified by both the appendix and ileocecal valve, without     limitations.  The quality of the prep was good, using MoviPrep.     The instrument was then slowly withdrawn as the colon was fully     examined.     <<PROCEDUREIMAGES>>     FINDINGS:  A diminutive polyp was found in the mid transverse     colon. This was removed with forceps and sent to pathology (jar 1)     (see image4).  Melanosis coli was found throughout the colon(see     image1).  This was otherwise a normal examination of the colon     (see image2, image3, and image5).   Retroflexed views in the     rectum revealed no abnormalities.    The scope was then withdrawn     from the patient and the procedure completed.     COMPLICATIONS:  None           ENDOSCOPIC IMPRESSION:     1) Diminutive polyp in the mid transverse colon, removed and     sent to pathology.     2) Melanosis staining of colon (from chronic laxative use).       RECOMMENDATIONS:     1) If the polyp(s) removed today are proven to be adenomatous     (pre-cancerous) polyps, you will need a repeat colonoscopy in 5     years. Otherwise you should continue to follow colorectal cancer     screening guidelines for "routine risk" patients with colonoscopy     in 10 years.     2) You will receive a letter within 1-2 weeks with the results     of your biopsy as well as final recommendations. Please call my     office if you have not received a letter after 3 weeks.           ______________________________     Rachael Fee, MD  n.     eSIGNED:   Rachael Fee at 01/04/2011 12:02 PM           [name], [id number]  Note: An exclamation mark (!) indicates a result that was not dispersed into the flowsheet. Document Creation Date: 01/04/2011 12:05 PM _______________________________________________________________________  (1) Order result status: Final Collection or observation date-time: 01/04/2011 11:54 Requested date-time:  Receipt date-time:  Reported date-time:  Referring Physician:   Ordering Physician: Rob Bunting [phone]) Specimen Source:  Source: Launa Grill Order Number: [id number] Lab site:   Appended Document: Colonoscopy     Procedures Next Due Date:    Colonoscopy: 12/2020

## 2011-01-28 NOTE — Letter (Addendum)
Summary: Results Letter  [facility]  [address] [place], [place] [address]   Phone: [phone]  Fax: [phone]        January 15, 2011 MRN: [id number]    [facility] [address] [place], [place]  [address]    Dear Mr. Lievanos,   Good news.  The polyp that was removed during your recent procedure was NOT pre-cancerous.  You should continue to follow current colorectal cancer screening guidelines with a repeat colonoscopy in 10 years. You should contact your primary care MD around that time to schedule one.    Sincerely,  Rachael Fee MD  This letter has been electronically signed by your physician.  Appended Document: Results Letter Letter mailed

## 2011-03-17 ENCOUNTER — Encounter: Payer: Self-pay | Admitting: Nurse Practitioner

## 2011-04-03 LAB — CROSSMATCH: Antibody Screen: NEGATIVE

## 2011-04-03 LAB — BASIC METABOLIC PANEL
BUN: 11 mg/dL (ref 6–23)
BUN: 6 mg/dL (ref 6–23)
Calcium: 8.5 mg/dL (ref 8.4–10.5)
Calcium: 8.6 mg/dL (ref 8.4–10.5)
Calcium: 8.7 mg/dL (ref 8.4–10.5)
Chloride: 102 mEq/L (ref 96–112)
Chloride: 107 mEq/L (ref 96–112)
Creatinine, Ser: 0.81 mg/dL (ref 0.4–1.5)
Creatinine, Ser: 0.82 mg/dL (ref 0.4–1.5)
Creatinine, Ser: 0.95 mg/dL (ref 0.4–1.5)
GFR calc Af Amer: >60 mL/min (ref >60)
GFR calc Af Amer: >60 mL/min (ref >60)
GFR calc non Af Amer: >60 mL/min (ref >60)
GFR calc non Af Amer: >60 mL/min (ref >60)
GFR calc non Af Amer: >60 mL/min (ref >60)
GFR calc non Af Amer: >60 mL/min (ref >60)
Glucose, Bld: 112 mg/dL — ABNORMAL HIGH (ref 70–99)
Potassium: 4.2 mEq/L (ref 3.5–5.1)
Sodium: 133 mEq/L — ABNORMAL LOW (ref 135–145)
Sodium: 136 mEq/L (ref 135–145)

## 2011-04-03 LAB — CBC
HCT: 26.1 % — ABNORMAL LOW (ref 39.0–52.0)
HCT: 31.7 % — ABNORMAL LOW (ref 39.0–52.0)
Hemoglobin: 10.4 g/dL — ABNORMAL LOW (ref 13.0–17.0)
Hemoglobin: 8.5 g/dL — ABNORMAL LOW (ref 13.0–17.0)
MCHC: 33.2 g/dL (ref 30.0–36.0)
MCV: 87.9 fL (ref 78.0–100.0)
MCV: 88 fL (ref 78.0–100.0)
MCV: 88.4 fL (ref 78.0–100.0)
MCV: 89 fL (ref 78.0–100.0)
Platelets: 255 10*3/uL (ref 150–400)
Platelets: 280 10*3/uL (ref 150–400)
Platelets: 350 10*3/uL (ref 150–400)
RDW: 16.8 % — ABNORMAL HIGH (ref 11.5–15.5)
WBC: 11.3 10*3/uL — ABNORMAL HIGH (ref 4.0–10.5)
WBC: 11.7 10*3/uL — ABNORMAL HIGH (ref 4.0–10.5)
WBC: 6.2 10*3/uL (ref 4.0–10.5)

## 2011-04-03 LAB — MRSA PCR SCREENING: MRSA by PCR: NEGATIVE

## 2011-04-03 LAB — TISSUE CULTURE
Culture: NO GROWTH
Gram Stain: NONE SEEN

## 2011-04-03 LAB — VANCOMYCIN, TROUGH
Vancomycin Tr: 19.6 ug/mL (ref 10.0–20.0)
Vancomycin Tr: 12.7 ug/mL (ref 10.0–20.0)
Vancomycin Tr: 16.2 ug/mL (ref 10.0–20.0)
Vancomycin Tr: 29.9 ug/mL (ref 10.0–20.0)
Vancomycin Tr: 41.8 ug/mL (ref 10.0–20.0)

## 2011-04-04 LAB — CBC
HCT: 34.6 % — ABNORMAL LOW (ref 39.0–52.0)
HCT: 23.2 % — ABNORMAL LOW (ref 39.0–52.0)
HCT: 26.3 % — ABNORMAL LOW (ref 39.0–52.0)
HCT: 27.3 % — ABNORMAL LOW (ref 39.0–52.0)
HCT: 27.6 % — ABNORMAL LOW (ref 39.0–52.0)
HCT: 28.5 % — ABNORMAL LOW (ref 39.0–52.0)
HCT: 29.2 % — ABNORMAL LOW (ref 39.0–52.0)
HCT: 30.5 % — ABNORMAL LOW (ref 39.0–52.0)
Hemoglobin: 9.7 g/dL — ABNORMAL LOW (ref 13.0–17.0)
Hemoglobin: 10.1 g/dL — ABNORMAL LOW (ref 13.0–17.0)
Hemoglobin: 10.6 g/dL — ABNORMAL LOW (ref 13.0–17.0)
Hemoglobin: 9.4 g/dL — ABNORMAL LOW (ref 13.0–17.0)
Hemoglobin: 9.7 g/dL — ABNORMAL LOW (ref 13.0–17.0)
Hemoglobin: 9.8 g/dL — ABNORMAL LOW (ref 13.0–17.0)
MCHC: 33.4 g/dL (ref 30.0–36.0)
MCHC: 33.7 g/dL (ref 30.0–36.0)
MCHC: 34.2 g/dL (ref 30.0–36.0)
MCHC: 34.4 g/dL (ref 30.0–36.0)
MCHC: 34.6 g/dL (ref 30.0–36.0)
MCHC: 34.8 g/dL (ref 30.0–36.0)
MCHC: 35 g/dL (ref 30.0–36.0)
MCV: 90.2 fL (ref 78.0–100.0)
MCV: 90.7 fL (ref 78.0–100.0)
MCV: 92 fL (ref 78.0–100.0)
MCV: 92.1 fL (ref 78.0–100.0)
MCV: 93.3 fL (ref 78.0–100.0)
MCV: 93.3 fL (ref 78.0–100.0)
MCV: 94 fL (ref 78.0–100.0)
Platelets: 314 10*3/uL (ref 150–400)
Platelets: 332 10*3/uL (ref 150–400)
Platelets: 124 10*3/uL — ABNORMAL LOW (ref 150–400)
Platelets: 213 10*3/uL (ref 150–400)
Platelets: 229 10*3/uL (ref 150–400)
Platelets: 246 10*3/uL (ref 150–400)
RBC: 3.2 MIL/uL — ABNORMAL LOW (ref 4.22–5.81)
RBC: 2.52 MIL/uL — ABNORMAL LOW (ref 4.22–5.81)
RBC: 2.93 MIL/uL — ABNORMAL LOW (ref 4.22–5.81)
RBC: 3 MIL/uL — ABNORMAL LOW (ref 4.22–5.81)
RBC: 3.03 MIL/uL — ABNORMAL LOW (ref 4.22–5.81)
RBC: 3.13 MIL/uL — ABNORMAL LOW (ref 4.22–5.81)
RDW: 14.6 % (ref 11.5–15.5)
RDW: 13.8 % (ref 11.5–15.5)
RDW: 14.3 % (ref 11.5–15.5)
RDW: 14.3 % (ref 11.5–15.5)
RDW: 14.4 % (ref 11.5–15.5)
RDW: 14.5 % (ref 11.5–15.5)
WBC: 7.9 10*3/uL (ref 4.0–10.5)
WBC: 8.8 10*3/uL (ref 4.0–10.5)
WBC: 8.9 10*3/uL (ref 4.0–10.5)
WBC: 12.1 10*3/uL — ABNORMAL HIGH (ref 4.0–10.5)
WBC: 12.4 10*3/uL — ABNORMAL HIGH (ref 4.0–10.5)
WBC: 17.3 10*3/uL — ABNORMAL HIGH (ref 4.0–10.5)
WBC: 17.4 10*3/uL — ABNORMAL HIGH (ref 4.0–10.5)
WBC: 19 10*3/uL — ABNORMAL HIGH (ref 4.0–10.5)

## 2011-04-04 LAB — BASIC METABOLIC PANEL
BUN: 8 mg/dL (ref 6–23)
BUN: 8 mg/dL (ref 6–23)
BUN: 19 mg/dL (ref 6–23)
BUN: 27 mg/dL — ABNORMAL HIGH (ref 6–23)
BUN: 36 mg/dL — ABNORMAL HIGH (ref 6–23)
CO2: 24 mEq/L (ref 19–32)
CO2: 26 mEq/L (ref 19–32)
CO2: 26 mEq/L (ref 19–32)
CO2: 27 mEq/L (ref 19–32)
CO2: 27 mEq/L (ref 19–32)
CO2: 30 mEq/L (ref 19–32)
Calcium: 8.4 mg/dL (ref 8.4–10.5)
Calcium: 8.4 mg/dL (ref 8.4–10.5)
Calcium: 7.9 mg/dL — ABNORMAL LOW (ref 8.4–10.5)
Calcium: 8 mg/dL — ABNORMAL LOW (ref 8.4–10.5)
Calcium: 8 mg/dL — ABNORMAL LOW (ref 8.4–10.5)
Chloride: 101 mEq/L (ref 96–112)
Chloride: 104 mEq/L (ref 96–112)
Chloride: 104 mEq/L (ref 96–112)
Chloride: 104 mEq/L (ref 96–112)
Chloride: 107 mEq/L (ref 96–112)
Chloride: 107 mEq/L (ref 96–112)
Chloride: 107 mEq/L (ref 96–112)
Chloride: 107 mEq/L (ref 96–112)
Chloride: 109 mEq/L (ref 96–112)
Creatinine, Ser: 0.73 mg/dL (ref 0.4–1.5)
Creatinine, Ser: 0.8 mg/dL (ref 0.4–1.5)
Creatinine, Ser: 0.86 mg/dL (ref 0.4–1.5)
Creatinine, Ser: 0.9 mg/dL (ref 0.4–1.5)
Creatinine, Ser: 0.94 mg/dL (ref 0.4–1.5)
Creatinine, Ser: 1.03 mg/dL (ref 0.4–1.5)
Creatinine, Ser: 1.33 mg/dL (ref 0.4–1.5)
GFR calc Af Amer: >60 mL/min (ref >60)
GFR calc Af Amer: >60 mL/min (ref >60)
GFR calc Af Amer: >60 mL/min (ref >60)
GFR calc Af Amer: >60 mL/min (ref >60)
GFR calc Af Amer: >60 mL/min (ref >60)
GFR calc Af Amer: >60 mL/min (ref >60)
GFR calc Af Amer: >60 mL/min (ref >60)
GFR calc Af Amer: >60 mL/min (ref >60)
GFR calc non Af Amer: >60 mL/min (ref >60)
GFR calc non Af Amer: >60 mL/min (ref >60)
GFR calc non Af Amer: >60 mL/min (ref >60)
GFR calc non Af Amer: >60 mL/min (ref >60)
Glucose, Bld: 98 mg/dL (ref 70–99)
Glucose, Bld: 107 mg/dL — ABNORMAL HIGH (ref 70–99)
Glucose, Bld: 125 mg/dL — ABNORMAL HIGH (ref 70–99)
Glucose, Bld: 93 mg/dL (ref 70–99)
Glucose, Bld: 98 mg/dL (ref 70–99)
Potassium: 3.8 mEq/L (ref 3.5–5.1)
Potassium: 3.4 mEq/L — ABNORMAL LOW (ref 3.5–5.1)
Potassium: 3.8 mEq/L (ref 3.5–5.1)
Potassium: 4 mEq/L (ref 3.5–5.1)
Potassium: 4.2 mEq/L (ref 3.5–5.1)
Potassium: 4.3 mEq/L (ref 3.5–5.1)
Sodium: 139 mEq/L (ref 135–145)
Sodium: 138 mEq/L (ref 135–145)
Sodium: 139 mEq/L (ref 135–145)
Sodium: 140 mEq/L (ref 135–145)
Sodium: 141 mEq/L (ref 135–145)
Sodium: 145 mEq/L (ref 135–145)

## 2011-04-04 LAB — URINALYSIS, MICROSCOPIC ONLY
Leukocytes, UA: NEGATIVE
Nitrite: NEGATIVE
Specific Gravity, Urine: 1.008 (ref 1.005–1.030)
pH: 7 (ref 5.0–8.0)

## 2011-04-04 LAB — CULTURE, ROUTINE-ABSCESS

## 2011-04-04 LAB — COMPREHENSIVE METABOLIC PANEL
AST: 31 U/L (ref 0–37)
Albumin: 2.9 g/dL — ABNORMAL LOW (ref 3.5–5.2)
Alkaline Phosphatase: 30 U/L — ABNORMAL LOW (ref 39–117)
BUN: 35 mg/dL — ABNORMAL HIGH (ref 6–23)
Chloride: 108 mEq/L (ref 96–112)
GFR calc Af Amer: >60 mL/min (ref >60)
Potassium: 3.8 mEq/L (ref 3.5–5.1)
Total Bilirubin: 1 mg/dL (ref 0.3–1.2)
Total Protein: 5.5 g/dL — ABNORMAL LOW (ref 6.0–8.3)

## 2011-04-04 LAB — POCT I-STAT, CHEM 8
Creatinine, Ser: 1.4 mg/dL (ref 0.4–1.5)
Glucose, Bld: 119 mg/dL — ABNORMAL HIGH (ref 70–99)
HCT: 26 % — ABNORMAL LOW (ref 39.0–52.0)
Hemoglobin: 8.8 g/dL — ABNORMAL LOW (ref 13.0–17.0)
Sodium: 144 mEq/L (ref 135–145)
TCO2: 28 mmol/L (ref 0–100)

## 2011-04-04 LAB — MAGNESIUM: Magnesium: 2.5 mg/dL (ref 1.5–2.5)

## 2011-04-04 LAB — GLUCOSE, CAPILLARY
Glucose-Capillary: 107 mg/dL — ABNORMAL HIGH (ref 70–99)
Glucose-Capillary: 119 mg/dL — ABNORMAL HIGH (ref 70–99)
Glucose-Capillary: 122 mg/dL — ABNORMAL HIGH (ref 70–99)
Glucose-Capillary: 123 mg/dL — ABNORMAL HIGH (ref 70–99)
Glucose-Capillary: 126 mg/dL — ABNORMAL HIGH (ref 70–99)
Glucose-Capillary: 127 mg/dL — ABNORMAL HIGH (ref 70–99)
Glucose-Capillary: 137 mg/dL — ABNORMAL HIGH (ref 70–99)
Glucose-Capillary: 140 mg/dL — ABNORMAL HIGH (ref 70–99)

## 2011-04-04 LAB — URINE CULTURE

## 2011-04-04 LAB — AMYLASE: Amylase: 41 U/L (ref 27–131)

## 2011-04-04 LAB — ANAEROBIC CULTURE

## 2011-04-04 LAB — VANCOMYCIN, TROUGH
Vancomycin Tr: 18.1 ug/mL (ref 10.0–20.0)
Vancomycin Tr: 21.5 ug/mL — ABNORMAL HIGH (ref 10.0–20.0)

## 2011-04-05 LAB — URINALYSIS, ROUTINE W REFLEX MICROSCOPIC
Bilirubin Urine: NEGATIVE
Glucose, UA: NEGATIVE mg/dL
Hgb urine dipstick: NEGATIVE
Ketones, ur: NEGATIVE mg/dL
Nitrite: NEGATIVE
Protein, ur: NEGATIVE mg/dL
Specific Gravity, Urine: >1.046 — ABNORMAL HIGH (ref 1.005–1.030)
Urobilinogen, UA: 1 mg/dL (ref 0.0–1.0)
pH: 6.5 (ref 5.0–8.0)

## 2011-04-05 LAB — POCT I-STAT 3, ART BLOOD GAS (G3+)
Acid-base deficit: 2 mmol/L (ref 0.0–2.0)
Acid-base deficit: 2 mmol/L (ref 0.0–2.0)
Acid-base deficit: 2 mmol/L (ref 0.0–2.0)
Acid-base deficit: 3 mmol/L — ABNORMAL HIGH (ref 0.0–2.0)
Acid-base deficit: 4 mmol/L — ABNORMAL HIGH (ref 0.0–2.0)
Bicarbonate: 21.1 mEq/L (ref 20.0–24.0)
Bicarbonate: 21.2 mEq/L (ref 20.0–24.0)
Bicarbonate: 21.2 mEq/L (ref 20.0–24.0)
Bicarbonate: 22.2 mEq/L (ref 20.0–24.0)
Bicarbonate: 23 mEq/L (ref 20.0–24.0)
Bicarbonate: 23 mEq/L (ref 20.0–24.0)
O2 Saturation: 100 %
O2 Saturation: 83 %
O2 Saturation: 86 %
O2 Saturation: 89 %
O2 Saturation: 92 %
O2 Saturation: 93 %
O2 Saturation: 97 %
Patient temperature: 36.1
Patient temperature: 36.6
Patient temperature: 36.8
Patient temperature: 98
TCO2: 22 mmol/L (ref 0–100)
TCO2: 22 mmol/L (ref 0–100)
TCO2: 22 mmol/L (ref 0–100)
TCO2: 23 mmol/L (ref 0–100)
TCO2: 24 mmol/L (ref 0–100)
TCO2: 24 mmol/L (ref 0–100)
TCO2: 26 mmol/L (ref 0–100)
pCO2 arterial: 29.3 mmHg — ABNORMAL LOW (ref 35.0–45.0)
pCO2 arterial: 32.3 mmHg — ABNORMAL LOW (ref 35.0–45.0)
pCO2 arterial: 32.9 mmHg — ABNORMAL LOW (ref 35.0–45.0)
pCO2 arterial: 36 mmHg (ref 35.0–45.0)
pCO2 arterial: 41.9 mmHg (ref 35.0–45.0)
pH, Arterial: 7.364 (ref 7.350–7.450)
pH, Arterial: 7.365 (ref 7.350–7.450)
pH, Arterial: 7.381 (ref 7.350–7.450)
pH, Arterial: 7.412 (ref 7.350–7.450)
pO2, Arterial: 47 mmHg — ABNORMAL LOW (ref 80.0–100.0)
pO2, Arterial: 51 mmHg — ABNORMAL LOW (ref 80.0–100.0)
pO2, Arterial: 57 mmHg — ABNORMAL LOW (ref 80.0–100.0)
pO2, Arterial: 64 mmHg — ABNORMAL LOW (ref 80.0–100.0)
pO2, Arterial: 67 mmHg — ABNORMAL LOW (ref 80.0–100.0)
pO2, Arterial: 69 mmHg — ABNORMAL LOW (ref 80.0–100.0)
pO2, Arterial: 74 mmHg — ABNORMAL LOW (ref 80.0–100.0)
pO2, Arterial: 85 mmHg (ref 80.0–100.0)

## 2011-04-05 LAB — POCT I-STAT, CHEM 8
Calcium, Ion: 1.2 mmol/L (ref 1.12–1.32)
Chloride: 106 mEq/L (ref 96–112)
HCT: 24 % — ABNORMAL LOW (ref 39.0–52.0)

## 2011-04-05 LAB — BASIC METABOLIC PANEL
BUN: 16 mg/dL (ref 6–23)
BUN: 17 mg/dL (ref 6–23)
BUN: 28 mg/dL — ABNORMAL HIGH (ref 6–23)
CO2: 23 mEq/L (ref 19–32)
Calcium: 8.2 mg/dL — ABNORMAL LOW (ref 8.4–10.5)
Chloride: 112 mEq/L (ref 96–112)
Creatinine, Ser: 0.9 mg/dL (ref 0.4–1.5)
Creatinine, Ser: 0.97 mg/dL (ref 0.4–1.5)
Creatinine, Ser: 1.3 mg/dL (ref 0.4–1.5)
GFR calc non Af Amer: 56 mL/min — ABNORMAL LOW (ref >60)
GFR calc non Af Amer: >60 mL/min (ref >60)
Glucose, Bld: 128 mg/dL — ABNORMAL HIGH (ref 70–99)
Glucose, Bld: 151 mg/dL — ABNORMAL HIGH (ref 70–99)
Glucose, Bld: 153 mg/dL — ABNORMAL HIGH (ref 70–99)
Potassium: 4.3 mEq/L (ref 3.5–5.1)
Sodium: 140 mEq/L (ref 135–145)

## 2011-04-05 LAB — HEPARIN LEVEL (UNFRACTIONATED)
Heparin Unfractionated: 0.56 IU/mL (ref 0.30–0.70)
Heparin Unfractionated: 0.71 IU/mL — ABNORMAL HIGH (ref 0.30–0.70)

## 2011-04-05 LAB — CROSSMATCH: Antibody Screen: NEGATIVE

## 2011-04-05 LAB — POCT I-STAT 3, VENOUS BLOOD GAS (G3P V)
pCO2, Ven: 47.6 mmHg (ref 45.0–50.0)
pH, Ven: 7.36 — ABNORMAL HIGH (ref 7.250–7.300)

## 2011-04-05 LAB — CBC
HCT: 23.9 % — ABNORMAL LOW (ref 39.0–52.0)
HCT: 25 % — ABNORMAL LOW (ref 39.0–52.0)
HCT: 36.4 % — ABNORMAL LOW (ref 39.0–52.0)
HCT: 41.3 % (ref 39.0–52.0)
HCT: 44.5 % (ref 39.0–52.0)
Hemoglobin: 12.8 g/dL — ABNORMAL LOW (ref 13.0–17.0)
Hemoglobin: 15.1 g/dL (ref 13.0–17.0)
Hemoglobin: 15.3 g/dL (ref 13.0–17.0)
Hemoglobin: 8.5 g/dL — ABNORMAL LOW (ref 13.0–17.0)
MCHC: 33.8 g/dL (ref 30.0–36.0)
MCHC: 34.5 g/dL (ref 30.0–36.0)
MCHC: 34.7 g/dL (ref 30.0–36.0)
MCHC: 35.1 g/dL (ref 30.0–36.0)
MCHC: 35.4 g/dL (ref 30.0–36.0)
MCV: 90.6 fL (ref 78.0–100.0)
MCV: 91.1 fL (ref 78.0–100.0)
MCV: 91.1 fL (ref 78.0–100.0)
MCV: 91.2 fL (ref 78.0–100.0)
Platelets: 146 10*3/uL — ABNORMAL LOW (ref 150–400)
Platelets: 154 10*3/uL (ref 150–400)
Platelets: 226 10*3/uL (ref 150–400)
Platelets: 227 10*3/uL (ref 150–400)
Platelets: 230 10*3/uL (ref 150–400)
Platelets: 262 10*3/uL (ref 150–400)
RBC: 4.85 MIL/uL (ref 4.22–5.81)
RDW: 13.1 % (ref 11.5–15.5)
RDW: 13.2 % (ref 11.5–15.5)
RDW: 13.2 % (ref 11.5–15.5)
RDW: 13.2 % (ref 11.5–15.5)
RDW: 13.7 % (ref 11.5–15.5)
WBC: 13.9 10*3/uL — ABNORMAL HIGH (ref 4.0–10.5)
WBC: 20.8 10*3/uL — ABNORMAL HIGH (ref 4.0–10.5)
WBC: 8.5 10*3/uL (ref 4.0–10.5)

## 2011-04-05 LAB — POCT I-STAT 4, (NA,K, GLUC, HGB,HCT)
Glucose, Bld: 111 mg/dL — ABNORMAL HIGH (ref 70–99)
Glucose, Bld: 121 mg/dL — ABNORMAL HIGH (ref 70–99)
Glucose, Bld: 152 mg/dL — ABNORMAL HIGH (ref 70–99)
Glucose, Bld: 154 mg/dL — ABNORMAL HIGH (ref 70–99)
HCT: 23 % — ABNORMAL LOW (ref 39.0–52.0)
HCT: 25 % — ABNORMAL LOW (ref 39.0–52.0)
HCT: 36 % — ABNORMAL LOW (ref 39.0–52.0)
HCT: 38 % — ABNORMAL LOW (ref 39.0–52.0)
Hemoglobin: 12.2 g/dL — ABNORMAL LOW (ref 13.0–17.0)
Hemoglobin: 12.9 g/dL — ABNORMAL LOW (ref 13.0–17.0)
Hemoglobin: 8.5 g/dL — ABNORMAL LOW (ref 13.0–17.0)
Potassium: 4.1 mEq/L (ref 3.5–5.1)
Potassium: 4.4 mEq/L (ref 3.5–5.1)
Potassium: 4.9 mEq/L (ref 3.5–5.1)
Potassium: 7.1 mEq/L (ref 3.5–5.1)
Sodium: 132 mEq/L — ABNORMAL LOW (ref 135–145)
Sodium: 137 mEq/L (ref 135–145)
Sodium: 139 mEq/L (ref 135–145)
Sodium: 140 mEq/L (ref 135–145)

## 2011-04-05 LAB — POCT I-STAT GLUCOSE
Glucose, Bld: 150 mg/dL — ABNORMAL HIGH (ref 70–99)
Operator id: 3342

## 2011-04-05 LAB — COMPREHENSIVE METABOLIC PANEL
ALT: 18 U/L (ref 0–53)
ALT: 23 U/L (ref 0–53)
AST: 15 U/L (ref 0–37)
Albumin: 3.2 g/dL — ABNORMAL LOW (ref 3.5–5.2)
Alkaline Phosphatase: 42 U/L (ref 39–117)
Alkaline Phosphatase: 44 U/L (ref 39–117)
BUN: 15 mg/dL (ref 6–23)
CO2: 23 mEq/L (ref 19–32)
CO2: 28 mEq/L (ref 19–32)
Calcium: 8.6 mg/dL (ref 8.4–10.5)
Calcium: 9.1 mg/dL (ref 8.4–10.5)
Chloride: 103 mEq/L (ref 96–112)
Chloride: 111 mEq/L (ref 96–112)
Creatinine, Ser: 0.81 mg/dL (ref 0.4–1.5)
GFR calc Af Amer: >60 mL/min (ref >60)
GFR calc non Af Amer: >60 mL/min (ref >60)
GFR calc non Af Amer: >60 mL/min (ref >60)
Glucose, Bld: 128 mg/dL — ABNORMAL HIGH (ref 70–99)
Glucose, Bld: 156 mg/dL — ABNORMAL HIGH (ref 70–99)
Potassium: 4.1 mEq/L (ref 3.5–5.1)
Potassium: 4.2 mEq/L (ref 3.5–5.1)
Sodium: 136 mEq/L (ref 135–145)
Sodium: 140 mEq/L (ref 135–145)
Total Bilirubin: 0.5 mg/dL (ref 0.3–1.2)
Total Bilirubin: 0.6 mg/dL (ref 0.3–1.2)
Total Protein: 5.8 g/dL — ABNORMAL LOW (ref 6.0–8.3)

## 2011-04-05 LAB — CARDIAC PANEL(CRET KIN+CKTOT+MB+TROPI): Total CK: 88 U/L (ref 7–232)

## 2011-04-05 LAB — GLUCOSE, CAPILLARY
Glucose-Capillary: 107 mg/dL — ABNORMAL HIGH (ref 70–99)
Glucose-Capillary: 119 mg/dL — ABNORMAL HIGH (ref 70–99)
Glucose-Capillary: 121 mg/dL — ABNORMAL HIGH (ref 70–99)
Glucose-Capillary: 124 mg/dL — ABNORMAL HIGH (ref 70–99)
Glucose-Capillary: 133 mg/dL — ABNORMAL HIGH (ref 70–99)
Glucose-Capillary: 134 mg/dL — ABNORMAL HIGH (ref 70–99)
Glucose-Capillary: 141 mg/dL — ABNORMAL HIGH (ref 70–99)

## 2011-04-05 LAB — HEMOGLOBIN A1C
Hgb A1c MFr Bld: 5.3 % (ref 4.6–6.1)
Mean Plasma Glucose: 105 mg/dL

## 2011-04-05 LAB — PROTIME-INR: Prothrombin Time: 13.7 seconds (ref 11.6–15.2)

## 2011-04-05 LAB — PREPARE PLATELETS

## 2011-04-05 LAB — PLATELET COUNT: Platelets: 193 10*3/uL (ref 150–400)

## 2011-04-05 LAB — CREATININE, SERUM: GFR calc non Af Amer: 58 mL/min — ABNORMAL LOW (ref >60)

## 2011-04-05 LAB — HEMOGLOBIN AND HEMATOCRIT, BLOOD: HCT: 26 % — ABNORMAL LOW (ref 39.0–52.0)

## 2011-04-05 LAB — ABO/RH: ABO/RH(D): A POS

## 2011-04-05 LAB — MAGNESIUM: Magnesium: 2.3 mg/dL (ref 1.5–2.5)

## 2011-05-11 NOTE — Assessment & Plan Note (Signed)
OFFICE VISIT   RAMA, MCCLINTOCK  DOB:  [birth year]                                        August 29, 2009  CHART #:  [id number]   The patient is a 65 year old gentleman who returns for a postoperative  followup.  He had coronary bypass grafting on June 28.  He had 4 grafts.  He had a 95% distal left main stenosis, and was taken to the operating  room emergently.  He did well initially in his early postoperative  course, but subsequently returned with a methicillin-resistant Staph  aureus bacteremia mediastinitis.  He underwent debridement and then  subsequently Dr. [name] did pectoralis flaps.  His course  was uncomplicated after that and he was discharged home on the 16th.  He  states that since his discharge, he had been doing well.  He got his  stitches out yesterday.  He is having some discomfort and he is taking  oxycodone occasionally, 2-3 times a day, but overall he states the pain  is very manageable.  He has not had any fevers, chills, or sweats.  He  has not had any drainage from the wound.   On physical examination, the patient is a 65 year old gentleman in no  acute distress.  His blood pressure is 114/72, pulse 69, respirations  are 20, and his oxygen saturation is 97% on room air.  Lungs are clear  with equal breath sounds.  His sternal incision is healing well, but  there is some erythema inferiorly with some mild induration in that area  as well.  There is no drainage or fluctuance.  He has no peripheral  edema.   Chest x-ray shows good aeration of the lungs bilaterally.   IMPRESSION:  The patient is a 65 year old gentleman.  He is now about 2  months out from emergent coronary bypass grafting for left main disease  and ST-elevation myocardial infarction and about a month out from  pectoralis flaps for treatment of mediastinitis.  He did have a  methicillin-resistant Staphylococcus aureus infection.  He is doing  extremely  well at this point in time and extraordinarily well all things  considered.  His exercise tolerance is slowly improving.  He has minimal  discomfort, which is well managed with pain medication.  He does,  however, have some erythema on the lower portion of his sternal  incision, I am a little concerned about that, so I am going go ahead and  start him on doxycycline 100 mg b.i.d. for 10 days and plan to see him  back in about 10 days to check on his progress.  No other medication  changes were made at this visit.   Salvatore Decent [name], M.D.  Electronically Signed   SCH/MEDQ  D:  08/29/2009  T:  08/30/2009  Job:  [id number]   cc:   Everardo Beals. Juanda Chance, MD, [facility]  [name], NP  [name], MD

## 2011-05-11 NOTE — Op Note (Signed)
NAMECRISTIANO, [name] NO.:  [id number]   MEDICAL RECORD NO.:  [id number]          PATIENT TYPE:  INP   LOCATION:  2305                         FACILITY:  [facility]   PHYSICIAN:  Salvatore Decent. [name], M.D. DATE OF BIRTH:  [birth year]   DATE OF PROCEDURE:  07/19/2009  DATE OF DISCHARGE:                               OPERATIVE REPORT   PREOPERATIVE DIAGNOSIS:  Mediastinitis.   POSTOPERATIVE DIAGNOSIS:  Mediastinitis.   PROCEDURE:  Sternal wound debridement.   SURGEON:  Salvatore Decent. [name], MD   ANESTHESIA:  General, monitored anesthesia care   BRIEF CLINIC NOTE:  The patient is a 65 year old male who underwent  coronary artery bypass grafting x4 by Dr. [name] on June 23, 2009.  The patient was discharged from the hospital more than 2 weeks ago and  had been making steady progress.  Approximately 5 days ago, the patient  first developed fevers in excess of 101 degrees Fahrenheit associated  with malaise and decreased appetite.  He ultimately presented to  [facility] in [place] where he was admitted to the hospital.  Blood  cultures subsequently grew methicillin-resistant Staphylococcus aureus.  A chest CT scan was performed during that hospitalization which failed  to reveal any clear evidence for sternal wound infection.  No other  possible source for bacteremia and septicemia was identified.  The  patient defervesced and clinically improved with IV antibiotics,  although he began to develop increased redness in the lower aspect of  the sternum.  He was transferred to [facility] and  cardiothoracic consultation was requested on the morning of July 19, 2009.  At that time, the patient remained clinically stable, but  physical exam was notable for obvious erythema with purulent drainage  emanating from the inferior third of the sternal incision.  The patient  is brought to the operating room for surgical debridement.  The patient  and his wife  provide full informed consent for the procedure as  described and all of their questions have been addressed.   OPERATIVE NOTE DETAIL:  The patient was brought to the operating room on  the above-mentioned date and placed in the supine position on the  operating room table.  The patient is already receiving intravenous  vancomycin antibiotics, and the patient is additionally administered 1.5  g of IV Zinacef prior to commencement of the operation.  Heavy  intravenous sedation was administered by the anesthesia team under the  care and direction of Dr. Johnella Moloney.  The patient was placed in the  supine position on the operating table.  The patient's anterior chest,  abdomen, and both groins were prepared and draped in sterile manner.  Sterile culture swabs were utilized to probe the opening in the inferior  third of the sternum with obvious purulent drainage emanating.  Cultures  were sent for both routine aerobic and anaerobic culture.  The wound is  explored and there is obvious abscess deep involving the entire  sternotomy closure.  A 10-blade knife was utilized to open the incision  locally, and it is subsequently clear  that purulence emanates along the  entire sternal closure and tunnels throughout.  The sternal incision was  subsequently reopened in its entirety.  All the sternal wires remained  intact and there is no sign of any fracture of either table of the  sternum.  There is no evidence for trauma to suggest a previous  dehiscence which subsequently became infected.  The purulence emanates  along the entire length of the sternotomy and clearly emanates deep from  within the mediastinum.  All the sternal wires were removed.  The wound  is subsequently irrigated with 3 L of  saline solution using a pulse lavage irrigation device.  The wound is  packed using wet-to-dry sterile Kerlix gauze.  The patient tolerated the  procedure well and was transported to the Post Anesthesia  Care Unit in  stable condition.  There are no intraoperative complications.       Salvatore Decent. [name], M.D.  Electronically Signed     CHO/MEDQ  D:  07/19/2009  T:  07/20/2009  Job:  [id number]   cc:   Salvatore Decent. [name], M.D.

## 2011-05-11 NOTE — Assessment & Plan Note (Signed)
OFFICE VISIT   [name], [name]  DOB:  Mar 09, 1946                                        September 22, 2009  CHART #:  [id number]   HISTORY:  The patient returns today for a wound check.  He states that  he saw Dr. Juanda Chance recently and his benazepril was discontinued.  He  feels good.  He has not had any fevers, chills, or sweats.  He is having  minimal discomfort.  He still has some numbness over the sternal area.  He has not had any anginal-type pain.   PHYSICAL EXAMINATION:  The patient is a 65 year old gentleman in no  acute distress.  Blood pressure 117/72, pulse 68, respirations are 16,  his ox saturation 99% on room air.  His incision is healing well.  There  is still some erythema inferiorly, but this is unchanged, just slightly  decreased from his last visit.  He does have dry peeling skin over his  trunk.   IMPRESSION:  The patient is doing well at this point in time.  He has  completed his course of antibiotics.  His energy level is starting to  improve.  His appetite has improved.  He still does get tired pretty  easily but I reassured him that will continue to improve over the next  several months.  He knows to call immediately if he has any signs of  infection in the wound.  He will follow up on a p.r.n. basis.   Salvatore Decent [name], M.D.  Electronically Signed   SCH/MEDQ  D:  09/22/2009  T:  09/23/2009  Job:  [id number]   cc:   Everardo Beals. Juanda Chance, MD, [facility]  Dorothyann Peng. Bevelyn Buckles, NP  [name], MD

## 2011-05-11 NOTE — Discharge Summary (Signed)
NAMEROGELIO, WAYNICK NO.:  [id number]   MEDICAL RECORD NO.:  [id number]          PATIENT TYPE:  INP   LOCATION:  2018                         FACILITY:  [facility]   PHYSICIAN:  Salvatore Decent. [name], M.D.DATE OF BIRTH:  1946/01/09   DATE OF ADMISSION:  06/20/2009  DATE OF DISCHARGE:  07/02/2009                               DISCHARGE SUMMARY   PRIMARY/ADMITTING DIAGNOSIS:  Chest pain.   ADDITIONAL/DISCHARGE DIAGNOSES:  1. ST-segment elevation myocardial infarction.  2. Left main and severe three-vessel coronary artery disease.  3. History of cerebrovascular accident, status post right carotid      stent with residual left upper extremity weakness.  4. Hyperlipidemia.  5. History of gastroesophageal reflux disease.  6. Prior history of tobacco abuse.  7. Postoperative ventricular tachycardia.  8. Postoperative acute blood loss anemia.   PROCEDURES PERFORMED:  1. Urgent cardiac catheterization.  2. Emergency coronary artery bypass grafting x4 (left internal mammary      artery to the LAD, saphenous vein graft to the posterior      descending, saphenous vein graft to the second obtuse marginal,      saphenous vein graft to the diagonal).  3. Endoscopic vein harvest, right leg.   HISTORY:  The patient is a 65 year old male with no known history of  coronary artery disease.  He reports having episodic chest discomfort  over the preceding several months, however on the day prior to this  admission he had a prolonged episode of chest pain which lasted several  hours.  He saw his primary care physician and was noted to have EKG  changes indicative of an old anterior and inferior infarction.  He was  subsequently referred to [facility] for further evaluation.  He  has had no subsequent chest discomfort.  He was seen in the office by  Dr. Juanda Chance and with a tentative diagnosis of unstable angina, was  admitted to [facility] for further evaluation  including cardiac  catheterization.   HOSPITAL COURSE:  Mr. Chalk was admitted to [facility] for  further cardiac workup.  He had an additional episode of chest  discomfort and was started on Integrilin and heparin.  He did rule in  for an ST-segment elevation myocardial infarction.  His enzymes on  admission showed CK of 88, CK-MB of 2.6 and troponin of 0.13.  It was  felt that he should undergo cardiac catheterization to delineate his  coronary anatomy and he was taken to the cath lab on June 28 by Dr.  Juanda Chance.  Cardiac catheterization showed 90-95% distal stenosis of the  left main coronary with significant three-vessel coronary artery disease  including a 70% circumflex marginal, total occlusion of the right  coronary artery and ejection fraction of 40%.  There was apical hypo-  akinesis.  There was a distal aortic disease and 80% right iliac artery  stenosis.  Because of his significant left main stenosis and his  unstable angina and recent MI, it was felt that he would benefit from  urgent coronary artery bypass grafting.  He was seen  in the cath lab by  Dr. [name] and his films were reviewed.  Dr. [name] explained  all risks, benefits and alternatives of surgery to the patient and he  agreed to proceed.  He was taken immediately to the operating room where  he underwent emergency CABG x4 as described above.  Please see  previously dictated operative report for complete details of the  surgery.  He was transferred to the SICU in stable condition.  He did  initially require transfusion of packed red blood cells for an acute  blood loss anemia.  He initially was intubated secondary to slow  awakening and some respiratory insufficiency, but he did become somewhat  agitated over postop day 1 and removed his endotracheal tube.  He did  not require reintubation but his pulmonary status initially remained  somewhat tenuous.  He required aggressive pulmonary toilet  measures  including nebulizer treatments and steroids.  He was placed on BiPAP  which did help.  He also developed frequent PVCs and had an episode of  nonsustained ventricular tachycardia while in the ICU.  This resolved  without aggressive treatment and he was started on a beta-blocker as  well as amiodarone.  Because of his marginal pulmonary status and a  productive cough, he was started on empiric IV antibiotics.  His status  slowly improved and he was able to be mobilized by cardiac rehab phase  I.  He was noted to be volume overloaded postoperatively and was started  on Lasix to which he has responded well.  By postop day 7, he was ready  for transfer to the Step-Down Unit.  At that point, he was off of all IV  antibiotics and steroids.  He has overall done well since that time.  His blood pressure and heart rate have remained stable.  He has been  continued on Plavix for his history of previous carotid stent.  He did  develop a leukocytosis which was thought to be secondary to the steroid  administration.  He has not been febrile and other labs including an  urinalysis were negative.  He has no other obvious source for  leukocytosis on physical exam.  He is presently walking independently  multiple times daily as well as with cardiac rehab phase I.  He is  diuresing well and is back below his preoperative weight although, he  continues to have some mild pedal edema on physical exam.  His incisions  are all healing well.  He has been afebrile and his vital signs have  been stable.  His last chest x-ray showed small bilateral pleural  effusions with bibasilar atelectasis, left greater than right.  His most  recent labs show a hemoglobin of 10.6, hematocrit 30.5, platelets 258,  white count 20.8.  Sodium 138, potassium 4.3, BUN 19, creatinine 0.9.  He is otherwise doing well.  He has been seen and evaluated on postop  day 9 and it is felt that he may be discharged home at this  time.   DISCHARGE MEDICATIONS:  1. Enteric-coated aspirin 81 mg daily.  2. Plavix 75 mg daily.  3. Lexapro 10 mg daily.  4. Zocor 20 mg at bedtime.  5. Lopressor 25 mg b.i.d.  6. Amiodarone 200 mg b.i.d.  7. Lasix 40 mg daily x3 more days.  8. Potassium 20 mEq daily x3 more days.  9. Lotensin 5 mg at bedtime.  10.Ultram 50-100 mg q.4 h. p.r.n. for pain.   DISCHARGE INSTRUCTIONS:  He is asked  to refrain from driving, heavy  lifting or strenuous activity.  He may continue ambulating daily and  using his incentive spirometer.  He may shower daily and clean his  incisions with soap and water.  He will continue low-fat, low-sodium  diet.   DISCHARGE FOLLOWUP:  He will need to make an appointment to see Dr.  Juanda Chance in 2 weeks.  He will then follow up with Dr. [name] in 3  weeks with a chest x-ray.  In the interim if he experiences any problems  or has questions, he is asked to contact our office.      Coral Ceo, P.A.      Salvatore Decent [name], M.D.  Electronically Signed    GC/MEDQ  D:  07/02/2009  T:  07/02/2009  Job:  [id number]   cc:   S. Kyra Manges, M.D.  [facility] Office

## 2011-05-11 NOTE — Consult Note (Signed)
[name], [name] NO.:  [id number]   MEDICAL RECORD NO.:  [id number]          PATIENT TYPE:  INP   LOCATION:  2305                         FACILITY:  [facility]   PHYSICIAN:  [name], MD DATE OF BIRTH:  1946/11/14   DATE OF CONSULTATION:  07/24/2009  DATE OF DISCHARGE:                                 CONSULTATION   CHIEF COMPLAINT:  Sternal wound.   REFERRING PHYSICIAN:  Salvatore Decent. [name], MD   HISTORY OF PRESENT ILLNESS:  [name] is a 65 year old Caucasian male  who is status post CABG on February 23, 2009.  Postoperatively, he had a  leukocytosis that was thought to be steroid related, however, he  recovered relatively uneventfully while in the hospital.  Approximately  2 weeks postoperatively, he noted ongoing sternal pain.  This was  progressively worse.  On July 16, 2009, the patient developed a fever at  101.  He presented to the [facility] Emergency Room where a CT scan of the  chest suggested mediastinitis.  Blood cultures were eventually positive  for MRSA.  He was treated with vancomycin.  After defervescing, he was  transferred to [facility] for care by his cardiac surgeon.  He denies  rigors or sweats aside from his fever.  He was taken to the operating  room on July 19, 2009, for formal debridement by Dr. [name].  This  revealed a sternal infection that encompassed the length of his incision  from his coronary artery bypass graft.  At that time, VAC dressing was  placed and the patient has remained afebrile and without leukocytosis.  He continues on his vancomycin.   PAST MEDICAL HISTORY:  He has a history of CVA x2 with residual left  upper extremity weakness.  He has had a right carotid stenting.  He has  coronary artery disease, gastroesophageal reflux disease, dyslipidemia,  history of prior tobacco abuse, and current malnutrition.   PAST SURGICAL HISTORY:  Significant for CABG using left internal mammary  to LAD and saphenous vein  graft to PDA, saphenous vein graft to obtuse  marginal, and saphenous vein graft to diagonal, right carotid stenting  and sternal debridement.   ALLERGIES:  CONTRAST DYE causes a rash.   MEDICATIONS:  He takes:  1. Amiodarone 200 mg p.o. b.i.d.  2. Aspirin 81 mg p.o. daily.  3. Dulcolax 10 mg p.o. daily.  4. Colace 100 mg p.o. b.i.d.  5. Ensure p.o. b.i.d. with meals.  6. Lexapro 10 mg p.o. daily.  7. Zocor 20 mg p.o. nightly.  8. Vancomycin per protocol.  9. Zofran 4 mg IV q.6 h. p.r.n. for nausea.   REVIEW OF SYSTEMS:  Per HPI.   PHYSICAL EXAMINATION:  VITAL SIGNS;  Heart rate is 84.  Blood pressure  is 104/82.  He is 98% on room air.  GENERAL:  He is in no acute distress.  LUNGS:  Clear to auscultation bilaterally.  HEART:  With regular rate and rhythm.  ABDOMEN:  Soft, nontender, and nondistended with no organomegaly.  CHEST:  Focus examination of the chest, his VAC dressing  is removed.  He  has a 26 x 5 cm midline wound over his sternum.  There is 95%  granulation tissue and 5% fibrinous slough.  There is no purulence.  He  does have purulence emanating from his previous mediastinal drain sites,  but this appears superficial and is not expressible with pressure.  There is no erythema about his wound.  All of his sternal wires have  been removed and the remaining sternum appears healthy and without  evidence of ongoing infection.   ASSESSMENT/PLAN:  [name] is a 65 year old male with a sternal wound,  status post coronary artery bypass grafting, positive for methicillin-  resistant Staphylococcus aureus.  I believe he has been adequately  debrided.  He should continue with his VAC therapy having a change every  3 days.  Unfortunately, I am not out of town for the next week and  unable to close him before departing.  Upon my return, I will be happy  to provide closure likely with bilateral pectoralis advancement flaps.  In the meantime, I believe he is to have a  nutrition consult as his last  prealbumin was low.  He should continue with his VAC dressing and his  vancomycin.  Upon my return, I will add him onto the surgery schedule  and have his chest closed as soon as possible.   As always, I appreciate the opportunity care of your patient.      [name], MD  Electronically Signed     CF/MEDQ  D:  07/25/2009  T:  07/26/2009  Job:  [id number]

## 2011-05-11 NOTE — Op Note (Signed)
NAMEMASSAI, HANKERSON NO.:  [id number]   MEDICAL RECORD NO.:  [id number]          PATIENT TYPE:  INP   LOCATION:  2307                         FACILITY:  [facility]   PHYSICIAN:  Salvatore Decent. [name], M.D.DATE OF BIRTH:  1946-10-03   DATE OF PROCEDURE:  06/23/2009  DATE OF DISCHARGE:                               OPERATIVE REPORT   PREOPERATIVE DIAGNOSIS:  Left main and three-vessel coronary artery  disease.   POSTOPERATIVE DIAGNOSIS:  Left main and three-vessel coronary artery  disease.   PROCEDURE:  Urgent median sternotomy, extracorporeal circulation,  coronary artery bypass grafting x4 (left internal mammary artery to left  anterior descending, saphenous vein graft to optional diagonal,  saphenous vein graft to obtuse marginal 2, saphenous vein graft to  posterior descending).  Endoscopic vein harvest to right leg.   SURGEON:  Salvatore Decent. [name], MD   ASSISTANT:  Coral Ceo, PA   ANESTHESIA:  General.   FINDINGS:  PDA poor quality target, main targets fair to good quality,  mammary and saphenous vein good quality.  Transesophageal echo revealed  ejection fraction of approximately 55% with no significant valvular  abnormalities.   CLINICAL NOTE:  Mr. Schaben is a 65 year old gentleman who was admitted  with chest pain, he re-presents for ST elevation MI, but subsequently  improved before being taken to the Catheterization Laboratory.  He was  catheterized urgently on the morning of June 23, 2009, where he was  found to have a 95% distal left main stenosis in the setting of a  totally occluded LAD and totally occluded right coronary artery.  The  patient was advised to undergo urgent coronary artery bypass grafting.  The indications, risks, benefits, and alternatives were discussed in  detail with the patient.  He understood and accepted the risks and  agreed to proceed.   OPERATIVE NOTE:  Mr. Popelka was brought to the preop holding area on June 23, 2009.  There were placed by Anesthesia for monitoring arterial,  intravenous, central venous, and pulmonary arterial pressure.  Intravenous antibiotics were administered.  The patient was taken to the  operating room, anesthetized and intubated.  Transesophageal  echocardiography was performed that showed relatively well-preserved  left ventricular function.  There was no significant valvular pathology.  The chest, abdomen, and legs were prepped and draped in the usual  fashion.   A median sternotomy was performed.  The left internal mammary artery was  harvested in a standard fashion.  Of note, the patient did bleed  copiously throughout the procedure.  Simultaneously, an incision was  made in the medial aspect of the right leg at the level of the knee and  the greater saphenous vein was harvested from midcalf to groin  endoscopically.  It was a good-quality conduit.   After harvesting the conduits, the patient was heparinized.  After  confirming adequate anticoagulation with ACT measurement, the  pericardium was opened.  The ascending aorta was cannulated via  concentric 2-0 Ethibond pledgeted purse-string sutures.  A dual-stage  venous cannula was placed via purse-string suture in the right atrial  appendage.  Cardiopulmonary bypass was instituted and the patient was  cooled to 32 degrees Celsius.  The coronary arteries were inspected and  anastomotic sites were chosen.  The conduits were inspected and cut to  length.  A foam pad was placed in the pericardium to protect the left  phrenic nerve.  A temperature probe was placed in the myocardial septum.  A cardioplegia cannula was placed in the ascending aorta.   The aorta was crossclamped.  The left ventricle was emptied via the  aortic root vent.  Cardiac arrest was then achieved with a combination  of cold antegrade blood cardioplegia and topical iced saline.  After  achieving a complete diastolic arrest which occurred quite  rapidly,  septal cooling was slower.  There was significant scarring in the  anterior wall and apex that might have been some in the septum as well.  The myocardium did cool to 10 degrees Celsius.  The following distal  anastomoses were performed.   First, a reverse saphenous vein graft was placed end to side to the  posterior descending branch of the right coronary.  This was a fair to  poor quality vessel.  It accepted a 1.5-mm probed proximally, but only a  1-mm probe distally.  The vein graft was anastomosed end to side with a  running 7-0 Prolene suture.  All anastomoses were probed proximally and  distally at their completion to ensure patency.  Cardioplegia was  administered down the graft.  There was satisfactory flow and good  hemostasis.   Next, a reverse saphenous vein graft was placed end to side to obtuse  marginal 2.  This was a dominant lateral branch.  It was compromised by  the 95% left main stenosis.  The vein graft was anastomosed end to side  with a running 7-0 Prolene suture.  There was good flow through the  graft and good hemostasis.   Next, a reverse saphenous vein graft was placed end to side to the  optional diagonal branch.  This was a dominant anterolateral branch.  It  was a 1.5-mm vessel.  There was some plaquing, but it was good quality,  although relatively thin walled.  The vein was anastomosed end to side  with a running 7-0 Prolene suture.  Again, there was good flow and good  hemostasis.   Next, the left internal mammary artery was brought through a window in  the pericardium.  The distal end was beveled and was anastomosed end to  side to the distal LAD.  The LAD was a 2-mm vessel, but did have some  diffuse disease, although 1.5-mm probe passed easily to the apex.  The  mammary was anastomosed end to side with a running 8-0 Prolene suture.  At the completion of the anastomosis, the bulldog clamp was briefly  removed and inspected for hemostasis.   Septal rewarming was noted.  The  bulldog clamp was placed.  The mammary pedicle was tacked to the  epicardial surface of the heart with 6-0 Prolene sutures.   Additional cardioplegia was administered.  The vein grafts were cut to  length.  The cardioplegic cannula was removed from the ascending aorta.  The proximal vein graft anastomoses were performed to 4.5 mm punch  aortotomies with running 6-0 Prolene sutures.  At the completion of the  final proximal anastomosis, the patient was placed in Trendelenburg  position.  Lidocaine was administered.  The aortic root was de-aired and  aortic crossclamp was removed.  Total  crossclamp time was 83 minutes.   The patient spontaneously resumed a bradycardic rhythm.  He did not  require defibrillation.  While the patient was being rewarmed, all  proximal and distal anastomoses were inspected for hemostasis.  Epicardial pacing wires were placed on the right ventricle and right  atrium and DDD pacing was initiated.  A low-dose dopamine infusion at 3  mcg/kg per minute was initiated.  When the patient remained warmed to a  core temperature of 37 degrees Celsius, he was weaned from  cardiopulmonary bypass without difficulty on the first attempt.  The  total bypass time was 133 minutes.  The patient had a cardiac index of  greater than 2 liters per minute per meter squared.  He remained  hemodynamically stable throughout the post-bypass period.   A test dose of protamine was administered and was well tolerated.  The  atrial and aortic cannulae were removed.  Remainder of the protamine was  administered without incident.  Chest was irrigated with 1 L of warm  normal saline.  Hemostasis was achieved.  The pericardium was  reapproximated with interrupted 3-0 silk sutures and came together  easily without kinking the underlying grafts.  The left pleural and 2  mediastinal chest tubes were placed with separate subcostal incisions.  The sternum was  closed with a combination of single and double  interrupted heavy gauge stainless steel wires.  Pectoralis fascia,  subcutaneous tissue, and skin were closed in a standard fashion.  All  sponge, needle, instrument counts were correct at the end of the  procedure.  The patient was taken from the operating room to the  Surgical Intensive Care Unit in fair condition.      Salvatore Decent [name], M.D.  Electronically Signed     SCH/MEDQ  D:  06/23/2009  T:  06/24/2009  Job:  [id number]   cc:   Everardo Beals. Juanda Chance, MD, [facility]

## 2011-05-11 NOTE — Assessment & Plan Note (Signed)
OFFICE VISIT   [name], [name]  DOB:  11-23-1946                                        September 08, 2009  CHART #:  [id number]   HISTORY:  The patient is a 65 year old gentleman who had coronary bypass  grafting on June 23, 2009.  He was taken to the operating room  emergently with 95% distal left main stenosis.  He developed MRSA  mediastinitis, underwent debridement, and subsequently had pectoralis  flaps by Dr. [name] on August 06, 2009.  He was in the office on  August 29, 2009, at which time he was doing well but did have some  erythema on the lower portion of the sternum, and there was no drainage  or fluctuance.  We were concerned he might have a little cellulitis at  that site and treated him with doxycycline 100 mg b.i.d.  He states that  since that time he really has not noticed any change in the redness.  He  still has not had any fevers or chills.  He has not had any drainage  from the incision.  He has been putting peroxide on the incision.   PHYSICAL EXAMINATION:  The patient is a well-appearing 65 year old  gentleman in no acute distress.  Blood pressure is 123/76, pulse 72,  respirations 18, his ox saturation is 96% on room air.  His sternal  incision is healed and intact.  There is still erythema over the lower  portion of the incision around the area of the xiphoid.  There is  peeling dry skin around the incision as well.   IMPRESSION:  No change in lower incision erythema with antibiotic  treatment.  I suspect this may be more of a hypersensitivity-type  reaction.  I did instruct him to stop putting peroxide on the incision  as that may be aggravating.  He is not having fevers or chills or  drainage, anything to suggest an infection at this point in time, so I  advised him that we would not do any additional  antibiotics and we will just watch this over the next couple of weeks.  I will plan to see him back at that time.   Salvatore Decent [name], M.D.  Electronically Signed   SCH/MEDQ  D:  09/08/2009  T:  09/09/2009  Job:  [id number]   cc:   Everardo Beals. Juanda Chance, MD, [facility]  Dorothyann Peng. Bevelyn Buckles, NP  [name], MD

## 2011-05-11 NOTE — Op Note (Signed)
NAMENORTH, ESTERLINE NO.:  [id number]   MEDICAL RECORD NO.:  [id number]          PATIENT TYPE:  INP   LOCATION:  3305                         FACILITY:  [facility]   PHYSICIAN:  [name], MD DATE OF BIRTH:  Mar 21, 1946   DATE OF PROCEDURE:  08/06/2009  DATE OF DISCHARGE:                               OPERATIVE REPORT   PREOPERATIVE DIAGNOSIS:  Sternal wound, complicated.   POSTOPERATIVE DIAGNOSIS:  Sternal wound, complicated.   PROCEDURES PERFORMED:  1. Debridement of skin, subcutaneous tissue, muscle, and bone.  2. Bilateral pectoralis myocutaneous advancement flaps.   SURGEON:  [name], MD   ASSISTANT:  Rosalyn Charters, Nurse Practitioner.   IV FLUIDS:  Per Anesthesia.   URINE OUTPUT:  300 mL.   ESTIMATED BLOOD LOSS:  150 mL.   DRAINS:  Jackson-Pratt x2.   COMPLICATIONS:  None.   CLINIC INDICATION:  Sandeep Radell is a 65 year old Caucasian male who is  status post coronary artery bypass graft with subsequent sternal wound  infection.  He had been readmitted to the hospital for this infection  and started on IV vancomycin.  Cardiac Surgery had debrided the sternum  multiple times and removed all of his wires.  For the last week and a  half, he has had a VAC on his sternal wound and the wound is granulating  without any evidence of local infection.  His leukocytosis has resolved.   After discussion of the risks of surgery, which include but are not  limited to death, bleeding, ongoing infection, damage to the nearby  structures, loss of muscle flaps, prolonged wound healing, and the need  for more surgery, Elston and his wife and desired to proceed.   DESCRIPTION OF OPERATION:  The patient was brought to the operating  room, placed in the supine position on the operating room table.  After  smooth and routine induction of general anesthesia with endotracheal  tube, the patient's chest was prepped with chlorhexidine and draped into  a sterile  field.  A 20 mL of 1% lidocaine with 1:100,000 epinephrine  were injected along the margins of the wound.  After waiting 6 minutes  for the hemostatic effects of epinephrine to take hold, the wound was  incised with a 10-blade and excised down to the level of the sternum  with electrocautery.  Next, dissection proceeded on the left side of the  chest.  Loose debris and pieces of sternum were removed with rongeurs  and the wound was curetted to produce a bleeding base.  Next, 3 liters  of normal saline via pulse lavage were used to wash out the wound.  The  pectoralis muscle was elevated off the chest wall and this continued  laterally with electrocautery.  Next, once the pectoralis muscle was  found to reach the midline without significant tension, the wound was  irrigated and hemostasis was verified with electrocautery.  Next,  attention was turned to the right side of the chest.  Because the skin  defect was wider on the right side than the left side, this skin was  elevated  off the pectoralis muscle.  This was done in the plane just  above the pectoralis muscle to keep the skin flap as thick as possible.  This was done to the anterior aspect of the midaxillary line.  Next,  hemostasis in this plane was verified with electrocautery.  Next, the  pectoralis muscle was then raised off the chest wall, beginning  medially, proceeding laterally.  This was done with electrocautery.  After verifying that the muscle was free from its costal attachments,  the wound was irrigated and hemostasis was verified with electrocautery.  Next, two 19-French round Blake drains were placed via separate stab  incisions.  The right one was tunneled above the right pectoralis muscle  and the left one was tunneled midline beneath the proposed muscle  closure at the midline.  They were secured to the skin with silk  sutures.  Next, the muscles were approximated in the midline with 2-0  Vicryl sutures placed in  figure-of-eight fashion.  Next, the  subcutaneous tissue was closed with interrupted 2-0 Vicryl sutures.  Next, the skin was closed with a combination of 2-0 vertical mattress  nylon sutures and buried interrupted 3-0 Monocryl sutures.  Dermabond  and Steri-Strips were applied.  Sponge and needle counts were reported  as correct.  Dry sterile dressings and an abdominal binder were placed  on the chest.  The patient was then extubated and transferred to the  recovery room in stable condition.  The use of a fist assistant was  necessary through all portions of the operation for assistance with  suction, retraction, and closure.      [name], MD  Electronically Signed     CF/MEDQ  D:  08/06/2009  T:  08/07/2009  Job:  [id number]

## 2011-05-11 NOTE — H&P (Signed)
NAMEREEDY, BIERNAT NO.:  [id number]   MEDICAL RECORD NO.:  [id number]          PATIENT TYPE:  INP   LOCATION:  2006                         FACILITY:  [facility]   PHYSICIAN:  Rollene Rotunda, MD, FACCDATE OF BIRTH:  12/26/1946   DATE OF ADMISSION:  07/18/2009  DATE OF DISCHARGE:                              HISTORY & PHYSICAL   PRIMARY CARE:  [name] nurse practitioner, [facility].   CARDIOLOGIST:  Dr. Charlies Constable.   REASON FOR PROCEDURE:  Patient with bacteremia.   HISTORY OF PRESENT ILLNESS:  The patient is a 65 year old gentleman  status post CABG on June 23, 2009.  His postoperative course was  complicated by anemia, leukocytosis thought to be related to steroids,  and self-extubation and some problems with pulmonary toilet, nonstandard  trigger tachycardia treated with amiodarone.  Since going home 2 weeks  ago, he has continued to have sternal soreness.  He said when he took a  ride to see Dr. Juanda Chance in the office early this week, the jarring from  the ride made the pain as vivid as it was immediately following the  surgery.  He has felt weak.  He has not been eating particularly well.  Finally developed a fever on July 21 to a temperature of 101.  He  presented to [facility] Emergency Room.  There a chest CT suggested  stranding behind the sternum and questionable mediastinitis.  Blood  cultures have subsequently been positive x2 for MRSA.  He has been  treated with vancomycin for listed sensitivities.  He has had no fevers  for 48 hours.  However, he was transferred here for further evaluation  and review by the surgeons.   He has had none of the substernal chest discomfort that was his previous  angina.  He has had no shaking chills or drenching sweats.  He had no  change in bowel habits.  He has had no cough.  He has had no shortness  of breath, PND, or orthopnea.  Has had no drainage from his sternum.  He  has had  ecchymosis of harvest site in his right leg but no drainage  there as well.  He has had no rashes or skin wounds.   PAST MEDICAL HISTORY:  Cerebrovascular accident with residual left upper  weakness from a right-sided CVA x2.  He has had right carotid stenting.  Coronary artery disease, three-vessel, with left main disease (the  patient was taken directly from the cath lab on June 15, 2009 to bypass  surgery), postoperative anemia, postop ventricular tachycardia, mildly  reduced junction fraction (40%), gastroesophageal reflux disease,  dyslipidemia, previous tobacco abuse.   PAST SURGICAL HISTORY:  CABG (LIMA to the LAD, SVG to PDA, SVG to OM and  SVG to diagonal), some right carotid stenting.   ALLERGIES:  CONTRAST DYE causes a rash.   MEDICATIONS:  (Out of hospital), Plavix 75 mg daily, aspirin 81 mg  daily, Lexapro 10 mg daily, metoprolol 25 mg b.i.d., Pacerone 200 mg  b.i.d., tramadol, benazepril 5 mg daily, simvastatin 20 mg daily.  SOCIAL HISTORY:  The patient is married, lives at home with his wife.  He previously smoked cigarettes, quitting in 2007.  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.   Family history is contributory for his father dying of a myocardial  infarction at 5.  A brother died at 10 from an MI.   REVIEW OF SYSTEMS:  As stated in the HPI, negative for all other  systems.   PHYSICAL EXAMINATION:  The patient is somewhat uncomfortable with  movement but in no distress.  Blood pressure 127/79, heart rate 88 and regular, afebrile, respiratory  rate 14.  HEENT: Eyes are unremarkable.  Pupils are equal, round and reactive to  light.  Fundi not visualized.  Oral mucosa unremarkable.  NECK:  No jugular distention at 45 degrees.  Carotid upstroke brisk and  symmetrical.  No bruits, thyromegaly.  LYMPHATICS:  No cervical, axillary, inguinal adenopathy.  LUNGS:  Clear to auscultation and percussion bilaterally.  No wheezing  or  crackles.  BACK:  No costovertebral angle tenderness.  CHEST:  A sternotomy scar appears to be well-healed.  There is some mild  surrounding erythema, not out of proportion to the recent surgery.  There is no warmth, there is no drainage, there is no sternal mobility.  Is mildly tender.  HEART:  PMI not displaced or sustained, S1-S2 within normal.  No S3-S4,  no clicks, rubs, no murmurs.  ABDOMEN:  Flat, positive bowel sounds normal in frequency and pitch.  No  bruits, rebound, guarding or midline pulsatile mass.  No splenomegaly.  SKIN:  No rashes, nodules.  EXTREMITIES:  2+ pulses throughout, no edema, no cyanosis or clubbing or  clubbing, there is ecchymosis over this over the endovascular saphenous  vein graft harvest sites but no drainage, no tenderness.  NEURO:  Oriented to place, time, cranial nerves II-XII grossly intact,  motor grossly intact throughout.   LABS:  On July 21:  WBC 19, hemoglobin 10.9, platelets 306, a left shift  (July 18, 2009), sodium 129, potassium 4.8, TSH 1.92, BUN 19, creatinine  1.08, AST 45, ALT 55.   Chest x-ray (at [facility]) small bilateral pleural effusions, mild left  basilar atelectasis.   Chest CT as above.   EKG:  Sinus rhythm, first-degree heart block, nonspecific diffuse T-wave  flattening and inversion, QT prolonged.   ASSESSMENT/PLAN:  1. Methicillin-resistant staphylococcus aureus bacteremia:  The      patient has MRSA bacteremia with 2 of 2 blood cultures being      positive.  He has been treated with vanc and is afebrile.  He has      had an elevated white blood cell count.  There are no localizing      findings or complaints other than the sternal discomfort, which is      out of proportion to a patient 1 month postop.  There is some CT      abnormality with excessive stranding behind the sternum but no      obvious abscess or osteomyelitis.  All of this suggests but clearly      is not conclusive of a mediastinitis.  At this  point, I would      continue with vancomycin.  I will check a transthoracic echo.  He      may need a transesophageal echo.  We will ask cardiac thoracic      surgery to review the CT scan which was done at  [facility] and is      available for review.  2. Carotid stenting.  The patient is on Plavix for this and will      continue.  3. Nonsustained ventricular tachycardia:  Will continue the      amiodarone.  4. Hyponatremia.  This will be followed and managed as needed.  NECK:      Atrial fibrillation again due the      patient is on amiodarone.  Dr. Juanda Chance suggested to continue for      another 5 weeks.  5. Anemia:  This is postop and stable.  6. Dyslipidemia:  He will continue on a statin.      Rollene Rotunda, MD, [facility]  Electronically Signed     JH/MEDQ  D:  07/18/2009  T:  07/18/2009  Job:  [id number]

## 2011-05-11 NOTE — Discharge Summary (Signed)
[name], [name] NO.:  [id number]   MEDICAL RECORD NO.:  [id number]          PATIENT TYPE:  INP   LOCATION:  2011                         FACILITY:  [facility]   PHYSICIAN:  Salvatore Decent. [name], M.D.DATE OF BIRTH:  1946-03-12   DATE OF ADMISSION:  07/18/2009  DATE OF DISCHARGE:  08/11/2009                               DISCHARGE SUMMARY   PRIMARY ADMITTING DIAGNOSIS:  Methicillin-resistant Staphylococcus  aureus bacteremia.   ADDITIONAL/DISCHARGE DIAGNOSES:  1. Sternal wound infection with mediastinitis.  2. Methicillin-resistant Staphylococcus aureus bacteremia secondary      sternal wound infection.  3. Coronary artery disease status post coronary artery bypass graft on      June 23, 2009.  4. History of previous cerebrovascular accident with residual left-      sided weakness.  5. Cerebrovascular obstructive disease status post right carotid      stent.  6. Gastroesophageal reflux disease.  7. Dyslipidemia.  8. Prior history of tobacco abuse.  9. History of postoperative ventricular tachycardia, resolved.  10.History of postoperative acute blood loss anemia.   PROCEDURE PERFORMED:  1. Sternal wound debridement.  2. Bilateral pectoralis myocutaneous muscle flap.   HISTORY:  The patient is a 65 year old male with history of coronary  artery disease status post CABG by Dr. Dorris Fernandez on June 23, 2009.  He  was discharged home on July 02, 2009, in good condition and had been  progressing well but had not returned to the office for followup.  Approximately 5 days prior to his admission here at [facility], the patient  developed fevers which exceeded 101 degrees Fahrenheit and were  associated with generalized malaise and anorexia.  He ultimately  presented to [facility] and was admitted for further workup.  Blood cultures there grew out MRSA.  A CT scan of the chest was  performed which did not show any clear evidence for sternal wound  infection;  however, no other source for his bacteremia with identified.  He did clinically improve with IV antibiotics, his fever resolved, and  his white count stabilized; however, he developed an increase in redness  in the lower aspect of his sternal wound.  Because of these findings, he  was subsequently transferred to [facility] for further  evaluation and treatment.   HOSPITAL COURSE:  Upon transfer to [facility], the patient was  continued on IV antibiotic therapy.  He was noted to have obvious  erythema with purulent drainage from the lower third of the sternal  incision.  He was seen by Dr. [name], and it was felt that he would  require return to the operating room for debridement and further wound  care.  He was taken to the operating room on July 19, 2009, and  underwent sternal debridement with removal of all sternal wires by Dr.  [name].  The wound was packed with wet-to-dry gauze, and the patient was  returned to the ICU for further care.  He underwent wet-to-dry dressing  changes for the next 48 hours, and since the wound had continued to  improve with topical  wound care he was seen by wound care nurse for  consideration of VAC placement which was ultimately performed on July 23, 2009.  He continued to show improvement in wound healing; however,  there was a large defect noted along the entire length of the sternum  which was felt would require closure via pectoralis muscle flap.  Plastic Surgery consultation was obtained, and the patient was seen by  Dr. [name] who agreed with the need for the surgery.  It was  felt that his nutritional status should be optimized and he should  continue with further VAC therapy, and once this was completed he could  proceed with the surgery.  The patient continued to show improvement and  ultimately was able to return to the operating room on August 06, 2009,  where he underwent sternal debridement and pectoralis flaps by  Dr.  [name].  He tolerated the procedure well and was returned to the floor  in stable condition.  Overall, his postoperative course has been  uneventful.  His right-sided drain has been removed, and his left-sided  drain has had only a small amount of serosanguineous drainage.  The  incision itself is healing well, and he has shown no evidence of  erythema or drainage from the wound itself.  He has remained afebrile  and has at this point completed 25 days of a 28-day course of IV  vancomycin.  His most recent labs show hemoglobin of 8.5, hematocrit  26.1, platelets 212, white count 6.2, sodium 133, potassium 4.2, BUN 6,  creatinine 0.8.  He has been seen and evaluated by Dr. [name] on the  morning of August 11, 2009, and since he is progressing well it is felt  that he could be discharged home with Home Health to assist with  dressing changes, drain management, and completion of his antibiotic  therapy.  Otherwise during his admission, the patient has remained  stable.  He has maintained normal sinus rhythm, and his amiodarone has  now been discontinued.  His vital signs have been stable, and otherwise  he is progressing well.   DISCHARGE MEDICATIONS:  Please see discharge medication manager in e-  chart.   DISCHARGE INSTRUCTIONS:  He is asked to refrain from driving, heavy  lifting, or strenuous activity and will continue with sternal  precautions until seen by his MD.  He may shower daily and clean his  incisions with soap and water.  He will continue to measure his JP  output, and this will be followed up by Dr. [name].  He will continue  low-fat, low-sodium diet.   DISCHARGE FOLLOWUP:  The patient will be seen by Dr. [name] in 1 week  for recheck and possible drain removal.  He will see Dr. Dorris Fernandez in  2 weeks with a chest x-ray and has asked to schedule an appointment to  see Dr. Juanda Chance in the next 2 weeks as well.  Home Health nurse has been  arranged to assist with wound  care, drain management, and IV antibiotic  therapy.  In the interim if he experiences any further problems or has  questions, he is asked to contact our office immediately.      Coral Ceo, P.A.      Salvatore Decent [name], M.D.  Electronically Signed    GC/MEDQ  D:  08/11/2009  T:  08/11/2009  Job:  [id number]   cc:   [name], MD  S. Kyra Manges, M.D.  Bruce Elvera Lennox Juanda Chance, MD, [facility]  [facility] Office.

## 2011-05-11 NOTE — Consult Note (Signed)
[name], [name] NO.:  [id number]   MEDICAL RECORD NO.:  [id number]          PATIENT TYPE:  INP   LOCATION:  2307                         FACILITY:  [facility]   PHYSICIAN:  Zenon Mayo, MDDATE OF BIRTH:  Aug 17, 1946   DATE OF CONSULTATION:  06/24/2009  DATE OF DISCHARGE:                                 CONSULTATION   PROCEDURE:  Intraoperative transesophageal echocardiogram.   INDICATIONS:  Emergent coronary artery bypass surgery.   DESCRIPTION:  [name] is a 65 year old male with history of  hypertension and obesity who was found today to have coronary artery  disease and was brought emergently to the operating room by Dr.  [name] for coronary artery bypass grafting.  The patient was  brought to the operating room and placed under general anesthesia.  After confirmation of endotracheal tube placement and orogastric  suctioning and I will initiate and 10 esophageal echo probe was placed  into the patient's esophagus without complication.  On 4-chamber view of  the heart, there did not appear to be any pericardial effusions and all  chambers of heart appeared to move appropriately.  He is on further  evaluation of the left ventricle.  The ventricle revealed normal wall  thickness, normal wall motion abnormalities, and good contractility with  an estimated ejection fraction of 55%.  The mitral valve was imaged  next.  The valve node or annulus did not appear to have any  calcifications or vegetations.  There was beveling of the posterior  leaflet, however, when color Doppler was placed across the valve, there  is no significant regurgitant jet.  There was a trace amount of mitral  regurg that was centrally located, but no other findings were noted.  The aortic valve was imaged next and revealed a trileaflet valve, but no  calcifications or vegetations seen.  The 3 leaflets appeared to move and  coapt well.  The aortic valve was measured at 2.52 cm2  by planimetry.  There do not appear to be any aortic insufficiency.  The pulmonic valve  appeared normal in structure and function.  The tricuspid valve revealed  trace amount of tricuspid regurgitation, otherwise normal.  The  interatrial septum was intact and there was no evidence of PFO.  The  left atrial appendage was free from thrombus.  The thoracic aorta  revealed minimal atherosclerotic disease.   At the conclusion of bypass, the heart was again imaged.  The patient  weaned from cardiopulmonary bypass without complication and essentially  no change was seen in any of these structures or function of the heart.  The left ventricle continued to have good contractility with no wall  motion abnormalities noted.  No valves remained in their prebypass  state.  At the conclusion of the procedure, the transesophageal echo  probe was removed from the patient's esophagus without evidence of  trauma.  The patient was taken directly from the operating room to the  intensive care unit in stable fashion.           ______________________________  Zenon Mayo, MD  WEF/MEDQ  D:  06/24/2009  T:  06/25/2009  Job:  [id number]

## 2011-05-11 NOTE — Cardiovascular Report (Signed)
NAMEIVAL, PACER NO.:  [id number]   MEDICAL RECORD NO.:  [id number]          PATIENT TYPE:  INP   LOCATION:  2307                         FACILITY:  [facility]   PHYSICIAN:  [name]. Juanda Chance, MD, FACCDATE OF BIRTH:  [birth year]   DATE OF PROCEDURE:  06/23/2009  DATE OF DISCHARGE:                            CARDIAC CATHETERIZATION   CLINICAL HISTORY:  Mr. Everton is a 65 year old and has no prior history  of known heart disease.  He has had a previous carotid neurectomy.  He  has had chest pain over the last week and a severe episode on Wednesday,  and was referred by Dr. [name].  I saw him on Friday.  The ECG  suggested an anterior and inferior infarction of uncertain age.  We  admitted him to the hospital.  His troponins were positive and  consistent with a non-ST-elevation myocardial infarction.   PROCEDURE:  The procedure was performed by the right femoral arteries,  arterial sheath, and 5-French preformed coronary catheters.  A front  wall arterial puncture was performed and Omnipaque contrast was used.  Distal aortogram was performed in anticipation of need for a possible  balloon pump.  At the end of procedure, the patient developed  hypertension and severe chest pain.  This responded to sublingual  nitroglycerin, IV Lopressor, and morphine.  We debated putting a balloon  pump and because of distal aortic disease decided not to do this.  Cardiac surgery has been consulted.   RESULTS:  Left main coronary artery.  Left main coronary artery had a 90-  95% distal stenosis.   Left anterior descending artery. Left anterior descending artery was  completely occluded near the ostium.  The distal LAD filled via  collaterals to the diagonal branch and appeared to be a reasonable  caliber vessel.   The circumflex artery.  The circumflex artery gave rise to a ramus  branch, a marginal branch and a posterolateral branch.  There was a 70%  narrowing in the  ostium of the marginal branch.  There were  irregularities, but no other major obstruction.   The right coronary artery was completely occluded proximally.  There  were bridging collaterals and the distal vessel filled faintly both from  the bridging collaterals and collaterals from the left coronary artery.   The left ventriculogram.  Please note left ventriculogram performed in  the RAO projection showed hypo to akinesis of the apex.  The estimated  ejection fraction was 40%.   A distal aortogram.  Please note distal aortogram showed distal aortic  disease with narrowing just before the bifurcation.  There was also 80%  narrowing in the right iliac.   HEMODYNAMIC DATA:  Aortic pressure was 114/62 with a mean of 88 mmHg.  Left ventricular pressure is 114/7.   CONCLUSION:  1. Severe left main and 3-vessel coronary artery disease with 95%      stenosis in the left main, total occlusion in the left anterior      descending artery, 70% narrowing in the marginal branch of      circumflex  artery, and total occlusion of right coronary artery      with apical wall hypo to akinesis and an estimated ejection      fraction of 40%.  2. Distal aortic disease and 80% right iliac stenosis.   RECOMMENDATIONS:  The patient is now currently pain free, but has very  critical anatomy.  Dr. Donata Clay has been consulted and plans for  surgery later today.      Bruce Elvera Lennox Juanda Chance, MD, [facility]  Electronically Signed     BRB/MEDQ  D:  06/23/2009  T:  06/23/2009  Job:  [id number]   cc:   Ernestina Penna, M.D.  Phill Myron, NP

## 2011-05-14 NOTE — Discharge Summary (Signed)
NAMEWHIT, BRUNI NO.:  [id number]   MEDICAL RECORD NO.:  [id number]          PATIENT TYPE:  IPS   LOCATION:  4147                         FACILITY:  [facility]   PHYSICIAN:  Erick Colace, M.D.DATE OF BIRTH:  1946/01/26   DATE OF ADMISSION:  04/01/2006  DATE OF DISCHARGE:  04/12/2006                                 DISCHARGE SUMMARY   DISCHARGE DIAGNOSES:  1.  Right MCA infarct with Merci clot retrieval of thrombus and placement of      right internal carotid artery stent.  2.  Right lenticular nucleus hemorrhage.  3.  Situation depression.  4.  Hypertension.  5.  Urinary retention resolved.   HISTORY OF PRESENT ILLNESS:  Mr. Kurek is a 65 year old man in relatively  good health admitted and was found down by family with dense left  hemiparesis, gait deviation, dysarthria. CT of his head showed dense sign  right MCA. The patient underwent TPA with cerebral angiogram, Merci clot  retrieval of thrombus in the right ICA. A stent placed in the same day. A 2-  D echo done showed an EF of 55%, a mild septal hypertrophy. Followup CT  showed near hemorrhage right ventricular nucleus at 1-2 mm left to right  shift. Carotid Doppler showed right ICA with patent stent in place, no ICA  stenosis. The patient noted to continue with left hemiparesis, decrease in  sensation left side as well as left facial weakness and mild dysarthria. A  bedside swallow showed evidence of right pocketing, a decrease in right  facial strength in patient on D2 diet with thin liquids. Currently noted to  have decrease in endurance, less attention as well as poor balance, a  tendency to lean to left.   PAST MEDICAL HISTORY:  Negative for prior illnesses or surgeries.   ALLERGIES:  No known drug allergies.   FAMILY HISTORY:  Positive for CVA in an uncle.   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 Does not drink  and does not use any alcohol. Smokes one pack per day. Lives in a one level  home with three steps to entry.   HOSPITAL COURSE:  Mr. Devin Ganaway was admitted to rehab on April 01, 2006 for  inpatient therapy to consist to PT/OT daily. At the time of admission, the  patient was noted to be on a Duragesic patch for complaints of headache.  This was continued initially. The patient was maintained on aspirin and  Plavix for CVA prophylaxis. The patient noted to have situation depression  with lability and Lexapro 10 mg p.o. was started to assist with this. The  patient continued to have issues with voiding and he was started on Flomax  0.4 mg q.h.s. The Duragesic patch was discontinued and with discontinuing of  fentanyl the patient's voiding was noted to be improved. Flomax was  additionally discontinued and voiding was monitored with PVR checks x3. The  patient was noted to show a volume of 60-80 mL. Currently  PVR checks  discontinued and the patient was noted to be voiding without difficulty.  Followup labs done past admission showed a hemoglobin of 14.6, hematocrit  41.9, white count 8.2, platelets 281.  Check of electrolytes most recently  from April 19 reveals sodium 138, potassium 4.1, chloride 106, CO2 23, BUN  14, creatinine 0.9, glucose 100. LFTs within normal limits except for low  albumin of 3.2.   The patient's mood has been stable, he has been motivated and participating  along well with therapy.  E-stim was initiated by speech therapy for left  facial weakness. The patient is improving to check for __________, requires  main cueing. Currently the patient is participating in reading testing,  writing, critical thinking requiring moderate to maximum assist for  deductive reasoning. He is able to use speech strategies to compensate for  dysarthria with mean cueing for volume. Left inattention visual  facial  deficits slowly resolving. He is noted to have  some increase in left upper  extremity movement. He is currently supervision to minimal assist for ADL  supervision level at __________ level for mobility. Wife to provide 24 hour  supervision assistance past discharge. The patient to continue further  followup outpatient therapy past discharge. On April 12, 2006, the patient  is discharged to home.   DISCHARGE MEDICATIONS:  1.  Plavix 75 mg p.o. per day.  2.  Coated aspirin 81 mg a day.  3.  Lexapro 10 mg a day.  4.  Senokot S 2 p.o. q.h.s.  5.  Claritin 10 mg p.o. daily.  6.  Combivent inhaler 2 puffs b.i.d.  7.  Tylenol 62 mg p.o. q.4-6h p.r.n. pain.   DIET:  Low fat.   ACTIVITY:  As tolerated Supervision level.   FOLLOW UP:  The patient to followup with Dr. Pearlean Brownie and Dr. Corliss Skains in the  next 4-6 weeks. Followup with Dr. Wynn Banker in 4 weeks.      Greg Cutter, P.A.      Erick Colace, M.D.  Electronically Signed    PP/MEDQ  D:  04/11/2006  T:  04/12/2006  Job:  [id number]   cc:   Pramod P. Pearlean Brownie, MD  Fax: [phone]

## 2011-05-14 NOTE — H&P (Signed)
NAMEREINER, LOEWEN NO.:  [id number]   MEDICAL RECORD NO.:  [id number]          PATIENT TYPE:  IPS   LOCATION:  4147                         FACILITY:  [facility]   PHYSICIAN:  Ranelle Oyster, M.D.DATE OF BIRTH:  12-Apr-1946   DATE OF ADMISSION:  04/01/2006  DATE OF DISCHARGE:                                HISTORY & PHYSICAL   HISTORY OF PRESENT ILLNESS:  This is a 65 year old white male who was found  down by his family on March 26, 2006.  Patient had dense left hemiparesis,  gaze deviation to the right and dysarthria.  Work-up revealed a right MCA  stroke.  The patient underwent TPA and cerebral angio with Merci clot  retrieval thrombus from the right ICA with stent placed the same day.  Echo  revealed an ejection fraction of 55% with mild septal hypertrophy.  Follow-  up head CT revealed new hemorrhage in the right lenticular nucleus in 1 to 2  mm with a left-to-right shift.  Stent was in place and functional upon  carotid Duplex Dopplers.  Patient seen by speech and he was pocketing food  on the left side.  The patient was placed on D2 with thin liquid diet.  Patient continues with mild dysarthria and sensory decreased with general  improvement in symptoms.  He was brought to rehab today to further his  functional levels.   MEDICATIONS PRIOR TO ADMISSION:  Benadryl p.r.n. and multivitamin daily.   ALLERGIES:  NO KNOWN DRUG ALLERGIES.   REVIEW OF SYSTEMS:  Positive for constipation and headache.  Otherwise  pertinent positives listed above and full review is in the health and  history section of the chart.   PAST MEDICAL HISTORY:  None.  Patient was apparently healthy to the date of  this incident.   FAMILY HISTORY:  Positive for stroke.   SOCIAL HISTORY:  Patient is married, independent and working 25 hours a week  prior to arrival.  No alcohol was consumed.  Did smoke one pack per day.  His wife works.  He has a one level house with three steps to  enter.  Patient was driving.   LABORATORY DATA:  Hemoglobin 11.3, white count 7.7, platelets 176,000.  Sodium 137, potassium 3.7, BUN 12, creatinine 0.8.   PHYSICAL EXAMINATION:  GENERAL APPEARANCE:  Patient is pleasant, in no acute  distress.  He is a bit flat, otherwise normal.  VITAL SIGNS:  Blood pressure 124/82, pulse 84, respiratory rate 16.  Patient  is afebrile.  HEENT:  Pupils are equal, round, reactive to light and accommodation.  Extraocular movements are intact.  Nose and throat exam is unremarkable  except for a white film upon the tongue.  NECK:  Supple without JVD or lymphadenopathy.  CHEST:  Clear to auscultation bilaterally without wheezes or rales.  He had  a few rhonchi noted on the right.  CARDIOVASCULAR:  Regular rate and rhythm without murmurs, rubs, or gallops.  ABDOMEN:  Soft and nontender.  Bowel sounds positive.  SKIN:  Generally intact.  EXTREMITIES:  No clubbing, cyanosis, or edema was  seen in the extremities.  Pulses 2+.  NEUROLOGIC:  Cranial nerves II-XII revealed a left central VII and left  tongue deviation.  Patient scanned to all fields.  He may have a slight  preference to the right still.  Sensation was grossly within normal limits  today.  Patient could appreciate no difference in light touch or pinprick  from left-to-right today.  Motor function was 5/5 on the right.  Left-sided  motor function was 3 to 4/5 in the upper extremity and 3 to 4 on the left  lower extremity as well.  Reflexes were 2+ left and right today.  Cognitively, patient was generally appropriate.  He did lack some awareness,  however. Memory was good.  Mood was a bit flat.   ASSESSMENT/PLAN:  Functional deficits secondary to right middle cerebral  artery stroke with left hemiparesis and visual spatial deficits.  Patient  also with dysphagia.  Comprehensive inpatient rehab with PT to evaluate and  treat mobility and lower extremity strength.  OT will follow patient for  upper  extremity use in activities of daily living.  Speech will assess and  treat swallowing and cognitive issues.  24-hour nursing care for management  of bowel and bladder, skin and medication issues.  Nurse case manager and  social worker to assess psychosocial needs.  Estimated length of stay eight  to 14 days. Prognosis is good.  Goals:  Supervision and modified  independent.  1.  Pain management:  Fentanyl patch and p.r.n. Tylenol for headache.  2.  Deep venous thrombosis prophylaxis  with PAS hose.  3.  Blood pressure:  Monitor for now.  The patient has been borderline.  4.  Rash:  Patient has Sarna lotion ordered b.i.d.  5.  Situational depression:  Add Lexapro.  Ask Dr. Leonides Cave to evaluate.  6.  Constipation:  Add Sorbitol and Dulcolax suppository p.r.n.  7.  Thrush:  Nystatin swish and swallow.      Ranelle Oyster, M.D.  Electronically Signed     ZTS/MEDQ  D:  04/01/2006  T:  04/01/2006  Job:  [id number]

## 2011-05-14 NOTE — H&P (Signed)
NAMEDELMO, MATTY NO.:  [id number]   MEDICAL RECORD NO.:  [id number]          PATIENT TYPE:  INP   LOCATION:  3113                         FACILITY:  [facility]   PHYSICIAN:  Melvyn Novas, M.D.  DATE OF BIRTH:  26-Jun-1946   DATE OF ADMISSION:  03/26/2006  DATE OF DISCHARGE:                                HISTORY & PHYSICAL   ARRIVAL TIME:  Was 10 minutes after midnight on March 26, 2006.  My arrival  time was once zero hours  30 minutes on March 26, 2006.  The patient's date  of birth is Dec 07, 1946. This a 65 year old obese white male with a  medical record number [id number].   This the patient is a 65 year old male patient who presents with dense left  hemiparesis, left facial droop, mild dysarthria. The patient was brought in  by EMS.  The patient states that initially he was still feeling well at  about 10:30 p.m. last night when he went to bed after finishing up in the  bathroom.  The patient awoke about 15 minutes to midnight with left leg and  left face weakness, severity grade 5. The pattern is without any resolution  so far. The patient had a CT scan that has shown a dense right MCA sign.  He  has had no previous evaluations by our ER and no electronic chart.   PAST MEDICAL HISTORY:  The patient denies hypertension, diabetes mellitus,  hyperlipidemia. He states he is on no medications except the patient is  taking Benadryl and multivitamin. Nonsmoker, nondrinker, married, one  daughter. Family arrived here in the ER, now at the bedside, and agrees to  an IV TPA treatment.   HOME MEDICATIONS:  None.   PHYSICAL EXAMINATION:  VITAL SIGNS: The patient is tachypneic at 24  respiratory rate per minute. The patient is having a blood pressure.  Sister.  The a vital sign show 106/68, heart rate is 83. The patient's  weight is estimated to 220 pounds. A temperature was just taken and was  97.4.   Laboratory results have returned white blood cell count  10.4, hemoglobin  15.1, hematocrit  43.8, platelet count 236,000, neutrophils 71%.  Toxicology  screen has not returned. PT is 13.7.  INR is 1.   NEUROLOGIC EVALUATION:  The patient states that he was nauseated upon time  of arrival and vomited. He has now no longer any acute nausea.  Denies any  pain.  He is keenly aware of his left-sided deficit.  The patient by mental  status is able to respond to all my questions and tries to cooperate with my  examination. He has a mild dysarthria, no aphasia. The patient's cranial  nerve examination revealed gaze deviation to the right. The patient has also  lower facial droop on the left.  The tongue and uvula show a deviation with  weakness related to his right-sided stroke.  The patient has left-sided  dense motor deficits and no antigravity movement of the left arm or leg.   The patient's sensory exam shows that he has extinguished  touch on his  paralytic side. He was able to feel vibration, touch and pinprick on his  right side, however.   ASSESSMENT:  Right middle cerebral artery stroke verified by a CT images.  There is also evidence of an older right stroke that has well healed and  that is not surrounded by a vascular edema.   The patient is within 2 hours and 30 minutes of onset time. Here in the ER,  we will evaluate him first for IV TPA which is easily given. I am contacting  Dr. Kerby Nora for interventional possibility of [facility] device clot  retrieval.      Melvyn Novas, M.D.  Electronically Signed     CD/MEDQ  D:  03/26/2006  T:  03/26/2006  Job:  [id number]

## 2011-05-14 NOTE — Assessment & Plan Note (Signed)
Right MCA distribution infarct.  Last seen by me May 31, 2006.  He requested  going back to work last visit and I cleared him for part-time work.  He,  however, states that he was not able to get up on ladders due to feeling off  balance and therefore is no longer working.  His family doctor has written  him a note for total permanent disability.   EXAMINATION:  GENERAL:  No acute distress.  Mood and affect appropriate.  MUSCULOSKELETAL:  He has mild decrease sensation on the left side due to  CVA.  He in addition has some left fourth and fifth digits paresthesias.  Negative Tinel's over the elbow.  He has 5/5 strength bilateral upper and  lower extremities.  His gait unable to tandem gait but able to do toe  walk/heel walk.  He has good back range of motion.  His legs have nontender  palpation.   IMPRESSION:  Cerebrovascular accident, history of right middle cerebral  artery distribution.  He has no detectable residual weakness.  He just has  some mild balance disorder.  I do agree that he should not go up and down  ladders and I think he is disabled from his job.  I think he can tolerate  sedentary type of work.   I will see him back on a p.r.n. basis.  He would benefit for an EMG of his  left upper extremity to rule out ulnar neuropathy.      Erick Colace, M.D.  Electronically Signed     AEK/MedQ  D:  08/16/2006 13:37:05  T:  08/17/2006 02:07:40  Job #:  [id number]

## 2011-05-14 NOTE — Discharge Summary (Signed)
NAMELYRIC, HOAR                  ACCOUNT NO.:  [id number]   MEDICAL RECORD NO.:  [id number]          PATIENT TYPE:  INP   LOCATION:  3039                         FACILITY:  [facility]   PHYSICIAN:  Pramod P. Pearlean Brownie, MD    DATE OF BIRTH:  09/19/1946   DATE OF ADMISSION:  03/26/2006  DATE OF DISCHARGE:  04/01/2006                                 DISCHARGE SUMMARY   DISCHARGE DIAGNOSES:  1.  Right frontal and right basal ganglia infarct secondary to right middle      cerebral artery occlusion embolization from right internal carotid      artery occlusion, status post intravenous tPA and acute right internal      carotid artery angioplasty and stent with Merci device clot retrieval      with right hemorrhagic transformation.  2.  Smoker x40 years.   MEDICATIONS AT DISCHARGE:  1.  Plavix 75 mg a day.  2.  Aspirin 81 mg a day.  3.  Duragesic patch 25 mg q.3 days.   STUDIES PERFORMED:  1.  CT of the head on admission shows a dense right MCA site compatible with      thrombus or slow flow.  Acute-subacute changes of high right parietal      infarct.  2.  Chest x-ray shows low lung volumes and bibasilar atelectasis.  3.  Followup CT of the head following angioplasty shows subtle loss of gray-      white junction in the high right parietal region compatible with acute      right MCA infarct.  No hemorrhage.  4.  Cerebral angiogram performed by Dr. Kerby Nora shows angiographic T-      occlusion of the right internal carotid artery terminus, severe      preocclusive stenosis of a large ulcerated plaque in the right common      carotid artery.  Patient had stent-assisted angioplasty of the      preocclusive right carotid bulb within the vascular mechanical      thrombectomy using the Merci retrieval device with complete      revascularization in the right internal carotid artery terminus, the      right middle cerebral artery, and the right anterior cerebral artery.      This was  performed by Dr. Kerby Nora.  5.  Followup chest x-ray shows interval intubation with endotracheal tube      with stable bibasilar pulmonary opacities.  6.  CT of the head at 24 hours after TPA shows no hemorrhagic infarct within      the right lenticular form nucleus area of hemorrhage within the right      frontal lobe, a small amount of subarachnoid blood, and mild progression      of high right parietal infarct.  7.  CT of the head at 48 hours shows minimal right to left shift, otherwise      stable head CT.  8.  CT of the head performed April 4th, four days after angioplasty, shows      expected evolutionary changes of hemorrhagic infarctions  in the right      middle cerebral artery territory.  A small amount of subarachnoid blood      along the right frontal lobe.  9.  Two-D echocardiogram shows EF of 55%, inadequate to evaluate left      ventricular regional wall motion, mild asymmetric septal hypertrophy,      atrium mildly dilated, no obvious embolic source.  10. Transcranial Doppler was completed; results pending.  11. Carotid Doppler with right ICA patent stent in place, the left with no      ICA stenosis.  12. EKG was likely performed, though final copy not in chart.   LABORATORY DATA:  CBC:  Normal differential, normal coagulation studies,  normal chemistry with glucose 114 to 124, calcium 7.7, otherwise normal  chemistry.  Liver function tests normal.  Hemoglobin A1c 5.7, homocystine  11.4, cholesterol 154, triglycerides 148, HDL 27, and LDL 97.  Urinalysis  was normal.  Urine drug screen was normal.   HISTORY OF PRESENT ILLNESS:  [name] is a 65 year old, right-handed,  white male, who was found to have sudden onset dense left hemiparesis  facially, gaze deviation and extinction.  He was brought to the [facility]  Emergency Room where he was evaluated within the three-hour time frame and  was able to receive IV TPA.  The patient received two-thirds dose IV TPA  in  the emergency room and then was sent to the angiogram suite with Dr. Kerby Nora where the patient was found to have an occluded right internal  carotid artery.  He received an acute angioplasty with stent and the  catheter was advanced into the right MCA where there was a fresh clot.  Merci retrieval device was utilized to remove the clot and revascularization  was obtained.  He was admitted to the [facility] Neurologic Intensive Care  Unit for further evaluation.   HOSPITAL COURSE:  The patient was intubated during above-procedure, and  Critical Care Medicine consult was obtained to manage the ventilator.  He  had a small amount of right hemispheric subcortical hemorrhagic  transformation status post TPA and Merci retrieval device.  He was seen by  Therapy and was able to pass a swallowing evaluation and progress to a D-2  thin liquid diet.  PT and OT evaluated him and felt he would benefit from  inpatient rehab therapy and consult was made with them.  The patient has  vascular risk factors most significantly of a tobacco history.  The patient  has been advised to stop smoking.  He was placed on aspirin and Plavix for  secondary stroke prevention.  He needs to remain on aspirin and Plavix for  at least two months secondary to new ICA stent and then at that time  consider decreasing to one drug only.  He was transferred to Rehab for  continuation of therapies with followup planned with Dr. Pearlean Brownie in two  months.   CONDITION ON DISCHARGE:  Patient alert and oriented x3.  He has no aphasia  or dysarthria.  His eye movements are full.  He has left facial weakness.  He has left hemiparesis in his upper extremity 3 to 4/5 and decreased  sensation on his left side.  The left lower leg is also 3 to 4/5.  His heart  rate is regular.  His breath sounds are clear.  His abdomen is nontender.   DISCHARGE PLAN:  1.  Transferred to rehab for continuation of PT, OT, and speech  therapy. 2.   Aspirin and Plavix for secondary stroke prevention.  Will DC one of the      two drugs in approximately two months.  3.  Patient has been advised to stop smoking.  4.  Follow up with primary care physician in one month after discharge from      rehab.  5.  Follow up with Dr. Delia Heady in two to three months after discharge.      Annie Main, N.P.    ______________________________  Sunny Schlein. Pearlean Brownie, MD    SB/MEDQ  D:  04/01/2006  T:  04/02/2006  Job:  [id number]   cc:   Pramod P. Pearlean Brownie, MD  Fax: [phone]

## 2011-05-14 NOTE — Assessment & Plan Note (Signed)
He is status post right MCA distribution infarct.  He last saw me one month  ago.  He completed physical therapy.  He is ambulating without assistive  device.  He is totally independent with all his self care and mobility,  would like to go back to driving as well as work.   He has no pain complaints.  He can ambulate 15 minutes at a stretch.  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   PHYSICAL EXAMINATION:  VITAL SIGNS:  His blood pressure is 119/78, pulse 98,  respirations 17, O2 sat 95% on room air.  NEUROLOGIC:  Motor strength is 5- over 5 on the left side, 5/5 right side.  He has no evidence of ataxia.  He has full extraocular motion.  He has  normal visual fields as well as no evidence of tactile neglect on double  simultaneous testing.  Gait is without toe drag or knee instability.  He has  a slightly widened base of support.   IMPRESSION:  Cerebrovascular accident, right middle cerebral artery  distribution with excellent improvement in his left hemiparesis.  He has  just minimal residual weakness.  His tactile neglect and left homonomous  hemianopsia have resolved as well.   I think it is safe for him to return to driving in a gradual fashion, i.e.,  empty parking lot, followed by small streets.  [place]  I do not recommend any interstate or night time driving.  I think he  can also, after he gets back to driving, approximately one month he can  return to work starting part time and working up to his usual 25 hours per  week.   I will see him back on a p.r.n. basis.      Erick Colace, M.D.  Electronically Signed     AEK/MedQ  D:  05/31/2006 12:17:36  T:  05/31/2006 23:48:08  Job #:  [id number]

## 2011-05-14 NOTE — Assessment & Plan Note (Signed)
The patient follows up after a discharge from [facility] inpatient rehab  approximately 1 month ago.  He has a history of a right MCA distribution  infarct with left hemiparesis, left neglect, left field cut.  He had right  lenticular nucleus hemorrhage felt to be reperfusion.  He has been  maintained on Plavix as well as aspirin.  He has followed up with his  primary physician who wanted to put him on Lipitor; however, the patient has  heard bad things about it from friends and reluctant to start it.  We did  discuss the importance of reducing cholesterol and triglycerides to reduce  stroke recurrence.   We also went over the risks and benefits of the medication.   He does use a cane but not around the house.  He goes to occupational  therapy but no longer physical therapy.  He does still have some speech  therapy at [facility].   He last was employed, March 25, 2006, and wishes to go back to work when he  can.  He has not driven since his stroke.   SOCIAL HISTORY:  Married.  His wife does help him around the house.   VITAL SIGNS:  Blood pressure is 139/74, pulse 81, respirations 17, O2  saturation 96% on room air.   GENERAL:  No acute distress.  Mood and affect appropriate.  He has a left  field cut which he partially compensates for.  He has tactile neglect in the  left upper extremity.  His motor strength is 4/5 on the left side and 5/5 on  the right side in both upper and lower extremities.   His gait is without evidence of toe drag or knee instability but does have  some increased base support.  He mild dysdiadochokinesis of his left upper  extremity, supination, pronation.   IMPRESSION:  1.  Right middle cerebral artery distribution infarct.  2.  Left hemiparesis.  3.  Left tactile neglect.  4.  Left homonymous hemianopsia.  5.  Hyperlipidemia as stroke risk factor.  Encouraged the patient to try      Lipitor.  6.  Postop depression, continue Lexapro,  would use for a total of 6 months      at least.  I will see him back in 1 month if he is still getting physical therapy.      Erick Colace, M.D.  Electronically Signed     AEK/MedQ  D:  05/06/2006 13:26:29  T:  05/07/2006 14:59:03  Job #:  [id number]   cc:   Dr. Pearlean Brownie   Dr. Corliss Skains

## 2011-10-06 ENCOUNTER — Ambulatory Visit (INDEPENDENT_AMBULATORY_CARE_PROVIDER_SITE_OTHER): Payer: Medicare Other | Admitting: Cardiology

## 2011-10-06 ENCOUNTER — Encounter: Payer: Self-pay | Admitting: Cardiology

## 2011-10-06 DIAGNOSIS — I429 Cardiomyopathy, unspecified: Secondary | ICD-10-CM | POA: Insufficient documentation

## 2011-10-06 DIAGNOSIS — I6529 Occlusion and stenosis of unspecified carotid artery: Secondary | ICD-10-CM

## 2011-10-06 DIAGNOSIS — I25810 Atherosclerosis of coronary artery bypass graft(s) without angina pectoris: Secondary | ICD-10-CM

## 2011-10-06 DIAGNOSIS — E785 Hyperlipidemia, unspecified: Secondary | ICD-10-CM

## 2011-10-06 DIAGNOSIS — E669 Obesity, unspecified: Secondary | ICD-10-CM

## 2011-10-06 DIAGNOSIS — I4891 Unspecified atrial fibrillation: Secondary | ICD-10-CM

## 2011-10-06 DIAGNOSIS — I6350 Cerebral infarction due to unspecified occlusion or stenosis of unspecified cerebral artery: Secondary | ICD-10-CM

## 2011-10-06 NOTE — Patient Instructions (Signed)
Your physician has requested that you have a carotid duplex. This test is an ultrasound of the carotid arteries in your neck. It looks at blood flow through these arteries that supply the brain with blood. Allow one hour for this exam. There are no restrictions or special instructions.  The current medical regimen is effective;  continue present plan and medications.  Follow up in 1 year with Dr Hochrein.  You will receive a letter in the mail 2 months before you are due.  Please call us when you receive this letter to schedule your follow up appointment.  

## 2011-10-06 NOTE — Assessment & Plan Note (Signed)
His last HDL was 35 with and LDL of 85.  He will continue on the meds as listed although I would like him to increase exercise to raise the HDL.

## 2011-10-06 NOTE — Assessment & Plan Note (Signed)
We discussed increased exercise.

## 2011-10-06 NOTE — Assessment & Plan Note (Signed)
He has a carotid stent and the last ultrasound was 09.  I looked at this report.  I will schedule up follow up Doppler.

## 2011-10-06 NOTE — Assessment & Plan Note (Signed)
He has a mildly reduced ejection fraction. However, he has no symptoms. At this point we'll continue beta blockers. I will reassess at next visit.

## 2011-10-06 NOTE — Assessment & Plan Note (Signed)
The patient has no new sypmtoms.  No further cardiovascular testing is indicated.  We will continue with aggressive risk reduction and meds as listed.  

## 2011-10-06 NOTE — Progress Notes (Signed)
HPI The patient presents for follow up of CAD.  He previously saw Dr. Juanda Chance.  He had CABG in July 2010 with a mildly reduced EF.  Since last being seen he has done well. He has had no chest pain he had his acute MI. He does sit-ups everyday but he is not otherwise exercising. He has a bicycle in his house. He denies any chest pressure, neck or arm discomfort. He is not having any palpitations, presyncope or syncope. He denies any shortness of breath, PND or orthopnea. He is somewhat limited by foot problems and left-sided weakness from previous CVA.  Allergies  Allergen Reactions  . Atorvastatin   . Crestor (Rosuvastatin Calcium)   . Iodinated Diagnostic Agents   . Niaspan (Niacin (Antihyperlipidemic))     Current Outpatient Prescriptions  Medication Sig Dispense Refill  . aspirin 81 MG tablet Take 81 mg by mouth daily.        . citalopram (CELEXA) 40 MG tablet Take 20 mg by mouth daily.        . clopidogrel (PLAVIX) 75 MG tablet Take 75 mg by mouth daily.        . metoprolol (LOPRESSOR) 50 MG tablet Take 50 mg by mouth daily.       . simvastatin (ZOCOR) 20 MG tablet Take 10 mg by mouth 2 (two) times daily.         Past Medical History  Diagnosis Date  . CVA (cerebral vascular accident)   . Coronary artery disease     S/P NSTEMI and ECABGS for LM and 3v CAD 06/2009  . Carotid artery occlusion     right carotid stenting in 2007  . Hyperlipidemia   . Obesity   . Acid reflux   . Chest pain     No past surgical history on file.  ROS:  As stated in the HPI and negative for all other systems.  PHYSICAL EXAM BP 134/82  Resp 16  Ht 5\' 11"  (1.803 m)  Wt 233 lb (105.688 kg)  BMI 32.50 kg/m2  GENERAL:  Well appearing HEENT:  Pupils equal round and reactive, fundi not visualized, oral mucosa unremarkable NECK:  No jugular venous distention, waveform within normal limits, carotid upstroke brisk and symmetric, no bruits, no thyromegaly LYMPHATICS:  No cervical, inguinal  adenopathy LUNGS:  Clear to auscultation bilaterally BACK:  No CVA tenderness CHEST:  Well healed sternotomy scar. HEART:  PMI not displaced or sustained,S1 and S2 within normal limits, no S3, no S4, no clicks, no rubs, no murmurs ABD:  Flat, positive bowel sounds normal in frequency in pitch, no bruits, no rebound, no guarding, no midline pulsatile mass, no hepatomegaly, no splenomegaly EXT:  2 plus pulses upper, decreased DP/PT bilateral, no edema, no cyanosis no clubbing SKIN:  No rashes no nodules NEURO:  Cranial nerves II through XII grossly intact, motor slight left sided weakness PSYCH:  Cognitively intact, oriented to person place and time   EKG:  Sinus rhythm, rate 58, axis within normal limits, intervals within normal limits, old anteroseptal MI. No change from previous.  ASSESSMENT AND PLAN

## 2011-11-01 ENCOUNTER — Encounter (INDEPENDENT_AMBULATORY_CARE_PROVIDER_SITE_OTHER): Payer: Medicare Other | Admitting: *Deleted

## 2011-11-01 DIAGNOSIS — I6529 Occlusion and stenosis of unspecified carotid artery: Secondary | ICD-10-CM

## 2012-10-18 ENCOUNTER — Encounter: Payer: Self-pay | Admitting: Cardiology

## 2012-10-18 ENCOUNTER — Ambulatory Visit (INDEPENDENT_AMBULATORY_CARE_PROVIDER_SITE_OTHER): Payer: Medicare Other | Admitting: Cardiology

## 2012-10-18 VITALS — BP 140/80 | HR 64 | Ht 70.0 in | Wt 235.0 lb

## 2012-10-18 DIAGNOSIS — I25810 Atherosclerosis of coronary artery bypass graft(s) without angina pectoris: Secondary | ICD-10-CM

## 2012-10-18 DIAGNOSIS — E669 Obesity, unspecified: Secondary | ICD-10-CM

## 2012-10-18 DIAGNOSIS — I4891 Unspecified atrial fibrillation: Secondary | ICD-10-CM

## 2012-10-18 DIAGNOSIS — E785 Hyperlipidemia, unspecified: Secondary | ICD-10-CM

## 2012-10-18 NOTE — Progress Notes (Signed)
HPI The patient presents for follow up of CAD.  He had CABG in July 2010 with a mildly reduced EF.  Since I last saw him he has done well. He has had no chest pain he had his acute MI.  He denies any chest pressure, neck or arm discomfort. He is not having any palpitations, presyncope or syncope. He denies any shortness of breath, PND or orthopnea. He is somewhat limited by foot problems and left-sided weakness from previous CVA.  However, he does yard work. He was doing more exercising and has a stationary bike in his house but he hasn't been using it.  Allergies  Allergen Reactions  . Atorvastatin   . Crestor (Rosuvastatin Calcium)   . Iodinated Diagnostic Agents   . Niaspan (Niacin Er)     Current Outpatient Prescriptions  Medication Sig Dispense Refill  . aspirin 81 MG tablet Take 81 mg by mouth daily.        . clopidogrel (PLAVIX) 75 MG tablet Take 75 mg by mouth daily.        . metoprolol (LOPRESSOR) 50 MG tablet Take 50 mg by mouth daily.       . simvastatin (ZOCOR) 20 MG tablet Take 10 mg by mouth 2 (two) times daily.       . citalopram (CELEXA) 40 MG tablet Take 20 mg by mouth daily.          Past Medical History  Diagnosis Date  . CVA (cerebral vascular accident)   . Coronary artery disease     S/P NSTEMI and CABGS for LM and 3v CAD 06/2009  . Carotid artery occlusion     right carotid stenting in 2007  . Hyperlipidemia   . Obesity   . Acid reflux   . Chest pain     Past Surgical History  Procedure Date  . Coronary artery bypass graft     ROS:  As stated in the HPI and negative for all other systems.  PHYSICAL EXAM BP 140/80  Pulse 64  Ht 5\' 10"  (1.778 m)  Wt 235 lb (106.595 kg)  BMI 33.72 kg/m2  GENERAL:  Well appearing HEENT:  Pupils equal round and reactive, fundi not visualized, oral mucosa unremarkable, dentures NECK:  No jugular venous distention, waveform within normal limits, carotid upstroke brisk and symmetric, no bruits, no thyromegaly LUNGS:   Clear to auscultation bilaterally BACK:  No CVA tenderness CHEST:  Well healed sternotomy scar. HEART:  PMI not displaced or sustained,S1 and S2 within normal limits, no S3, no S4, no clicks, no rubs, no murmurs ABD:  Flat, positive bowel sounds normal in frequency in pitch, no bruits, no rebound, no guarding, no midline pulsatile mass, no hepatomegaly, no splenomegaly EXT:  2 plus pulses upper, decreased DP/PT bilateral, no edema, no cyanosis no clubbing   EKG:  Sinus rhythm, rate 61, axis within normal limits, intervals within normal limits, old anteroseptal MI. No change from previous.  10/18/2012   ASSESSMENT AND PLAN  CAD, AUTOLOGOUS BYPASS GRAFT -  The patient has no new sypmtoms. No further cardiovascular testing is indicated. We will continue with aggressive risk reduction and meds as listed.   HYPERLIPIDEMIA-MIXED -  His last HDL was 25 with and LDL of 77. He will continue on the meds as listed although I would like him to increase exercise to raise the HDL. A  CVA -  He has a history of carotid stenting. Last year he had 40-59% right stenosis and 0-39% left  stenosis. He is due for followup next month.  OBESITY -  We discussed increased exercise. He says that he will get up on his stationary bicycle.  Cardiomyopathy -  He has a mildly reduced ejection fraction on echo in 2010. However, he has no symptoms. At this point we'll continue beta blockers. No further imaging is indicated.

## 2012-10-18 NOTE — Patient Instructions (Addendum)
The current medical regimen is effective;  continue present plan and medications.  Follow up in 1 year with Dr Hochrein.  You will receive a letter in the mail 2 months before you are due.  Please call us when you receive this letter to schedule your follow up appointment.  

## 2012-10-31 ENCOUNTER — Telehealth: Payer: Self-pay | Admitting: Cardiology

## 2012-10-31 NOTE — Telephone Encounter (Signed)
Pt was told he needed a procedure for his heart and to call if we didn't call him by lunch, pls call  [phone]

## 2012-10-31 NOTE — Telephone Encounter (Signed)
Left message for pt to call back  °

## 2012-10-31 NOTE — Telephone Encounter (Signed)
Pt needs to be scheduled for yearly f/u carotid doppler  -  Left message for [facility]

## 2012-11-02 ENCOUNTER — Other Ambulatory Visit: Payer: Self-pay | Admitting: Cardiology

## 2012-11-02 ENCOUNTER — Encounter (INDEPENDENT_AMBULATORY_CARE_PROVIDER_SITE_OTHER): Payer: Medicare Other

## 2012-11-02 DIAGNOSIS — I6529 Occlusion and stenosis of unspecified carotid artery: Secondary | ICD-10-CM

## 2013-05-25 ENCOUNTER — Other Ambulatory Visit: Payer: Medicare Other

## 2013-05-25 ENCOUNTER — Encounter: Payer: Self-pay | Admitting: Nurse Practitioner

## 2013-05-25 ENCOUNTER — Ambulatory Visit (INDEPENDENT_AMBULATORY_CARE_PROVIDER_SITE_OTHER): Payer: Medicare Other | Admitting: Nurse Practitioner

## 2013-05-25 VITALS — BP 126/66 | HR 65 | Temp 97.6°F | Ht 69.0 in | Wt 236.0 lb

## 2013-05-25 DIAGNOSIS — Z79899 Other long term (current) drug therapy: Secondary | ICD-10-CM

## 2013-05-25 DIAGNOSIS — E785 Hyperlipidemia, unspecified: Secondary | ICD-10-CM

## 2013-05-25 DIAGNOSIS — R5383 Other fatigue: Secondary | ICD-10-CM

## 2013-05-25 DIAGNOSIS — E559 Vitamin D deficiency, unspecified: Secondary | ICD-10-CM

## 2013-05-25 DIAGNOSIS — R5381 Other malaise: Secondary | ICD-10-CM

## 2013-05-25 LAB — BASIC METABOLIC PANEL WITH GFR
CO2: 25 mEq/L (ref 19–32)
Calcium: 9.1 mg/dL (ref 8.4–10.5)
Chloride: 106 mEq/L (ref 96–112)
GFR, Est Non African American: 88 mL/min
Sodium: 139 mEq/L (ref 135–145)

## 2013-05-25 LAB — POCT CBC
Granulocyte percent: 74.2 %G (ref 37–80)
HCT, POC: 46 % (ref 43.5–53.7)
MCHC: 35.7 g/dL — AB (ref 31.8–35.4)
MPV: 8.2 fL (ref 0–99.8)
POC Granulocyte: 5.5 (ref 2–6.9)
POC LYMPH PERCENT: 20.5 %L (ref 10–50)
Platelet Count, POC: 175 10*3/uL (ref 142–424)
RDW, POC: 12.8 %
WBC: 7.4 10*3/uL (ref 4.6–10.2)

## 2013-05-25 LAB — HEPATIC FUNCTION PANEL
AST: 16 U/L (ref 0–37)
Albumin: 4.5 g/dL (ref 3.5–5.2)
Bilirubin, Direct: 0.1 mg/dL (ref 0.0–0.3)
Total Bilirubin: 0.7 mg/dL (ref 0.3–1.2)

## 2013-05-25 NOTE — Progress Notes (Signed)
Subjective:    Patient ID: [name], male    DOB: 07/30/1946, 67 y.o.   MRN: [id number]  Hypertension This is a chronic problem. The current episode started more than 1 year ago. The problem is unchanged. The problem is controlled. Pertinent negatives include no blurred vision, chest pain, headaches, palpitations, peripheral edema or shortness of breath. There are no associated agents to hypertension. Risk factors for coronary artery disease include dyslipidemia, obesity, post-menopausal state and male gender. Past treatments include beta blockers. The current treatment provides moderate improvement. Hypertensive end-organ damage includes CAD/MI and CVA (2010).  Hyperlipidemia This is a chronic problem. The problem is controlled. Recent lipid tests were reviewed and are normal. There are no known factors aggravating his hyperlipidemia. Pertinent negatives include no chest pain or shortness of breath. Current antihyperlipidemic treatment includes statins. The current treatment provides moderate improvement of lipids. Compliance problems include adherence to diet and adherence to exercise.  Risk factors for coronary artery disease include hypertension, male sex and obesity.  CAD Plavix working wwelll- no c/o bleeding depression Celexa working well with no side effects  Review of Systems  Eyes: Negative for blurred vision.  Respiratory: Negative for shortness of breath.   Cardiovascular: Negative for chest pain and palpitations.  Neurological: Negative for headaches.  All other systems reviewed and are negative.       Objective:   Physical Exam  Constitutional: He is oriented to person, place, and time. He appears well-developed and well-nourished.  HENT:  Head: Normocephalic.  Right Ear: External ear normal.  Left Ear: External ear normal.  Nose: Nose normal.  Mouth/Throat: Oropharynx is clear and moist.  Eyes: EOM are normal. Pupils are equal, round, and reactive to light.  Neck:  Normal range of motion. Neck supple. No thyromegaly present.  Cardiovascular: Normal rate, regular rhythm, normal heart sounds and intact distal pulses.   No murmur heard. Pulmonary/Chest: Effort normal and breath sounds normal. He has no wheezes. He has no rales.  Abdominal: Soft. Bowel sounds are normal.  Genitourinary:  Refuses prostate check  Musculoskeletal: Normal range of motion.  Neurological: He is alert and oriented to person, place, and time.  Skin: Skin is warm and dry.  Psychiatric: He has a normal mood and affect. His behavior is normal. Judgment and thought content normal.   BP 126/66  Pulse 65  Temp(Src) 97.6 F (36.4 C) (Oral)  Ht 5\' 9"  (1.753 m)  Wt 236 lb (107.049 kg)  BMI 34.84 kg/m2 Results for orders placed in visit on 05/25/13  POCT CBC      Result Value Range   WBC 7.4  4.6 - 10.2 K/uL   Lymph, poc 1.5  0.6 - 3.4   POC LYMPH PERCENT 20.5  10 - 50 %L   POC Granulocyte 5.5  2 - 6.9   Granulocyte percent 74.2  37 - 80 %G   RBC 5.1  4.69 - 6.13 M/uL   Hemoglobin 16.4  14.1 - 18.1 g/dL   HCT, POC 40.9  81.1 - 53.7 %   MCV 90.9  80 - 97 fL   MCH, POC 32.4 (*) 27 - 31.2 pg   MCHC 35.7 (*) 31.8 - 35.4 g/dL   RDW, POC 91.4     Platelet Count, POC 175.0  142 - 424 K/uL   MPV 8.2  0 - 99.8 fL          Assessment & Plan:   1. Other and unspecified hyperlipidemia   2. Unspecified  vitamin D deficiency   3. Other malaise and fatigue   4. Encounter for long-term (current) use of other medications    Orders Placed This Encounter  Procedures  . NMR Lipoprofile with Lipids  . BASIC METABOLIC PANEL WITH GFR  . Hepatic function panel  . Vitamin D 25 hydroxy  . POCT CBC     Medication List       These changes are accurate as of: 05/25/2013  2:33 PM. If you have any questions, ask your nurse or doctor.          TAKE these medications       aspirin 81 MG tablet  Take 81 mg by mouth daily.     citalopram 40 MG tablet  Commonly known as:  CELEXA   Take 20 mg by mouth daily.     clopidogrel 75 MG tablet  Commonly known as:  PLAVIX  Take 75 mg by mouth daily.     metoprolol 50 MG tablet  Commonly known as:  LOPRESSOR  Take 50 mg by mouth daily.     simvastatin 20 MG tablet  Commonly known as:  ZOCOR  Take 10 mg by mouth 2 (two) times daily.       Continue all meds Labs pending Exercise Follow-up in 3 months  Mary-Margaret Daphine Deutscher, FNP

## 2013-05-25 NOTE — Progress Notes (Signed)
, °

## 2013-05-25 NOTE — Patient Instructions (Signed)
Health Maintenance, Males A healthy lifestyle and preventative care can promote health and wellness.  Maintain regular health, dental, and eye exams.  Eat a healthy diet. Foods like vegetables, fruits, whole grains, low-fat dairy products, and lean protein foods contain the nutrients you need without too many calories. Decrease your intake of foods high in solid fats, added sugars, and salt. Get information about a proper diet from your caregiver, if necessary.  Regular physical exercise is one of the most important things you can do for your health. Most adults should get at least 150 minutes of moderate-intensity exercise (any activity that increases your heart rate and causes you to sweat) each week. In addition, most adults need muscle-strengthening exercises on 2 or more days a week.   Maintain a healthy weight. The body mass index (BMI) is a screening tool to identify possible weight problems. It provides an estimate of body fat based on height and weight. Your caregiver can help determine your BMI, and can help you achieve or maintain a healthy weight. For adults 20 years and older:  A BMI below 18.5 is considered underweight.  A BMI of 18.5 to 24.9 is normal.  A BMI of 25 to 29.9 is considered overweight.  A BMI of 30 and above is considered obese.  Maintain normal blood lipids and cholesterol by exercising and minimizing your intake of saturated fat. Eat a balanced diet with plenty of fruits and vegetables. Blood tests for lipids and cholesterol should begin at age 20 and be repeated every 5 years. If your lipid or cholesterol levels are high, you are over 50, or you are a high risk for heart disease, you may need your cholesterol levels checked more frequently.Ongoing high lipid and cholesterol levels should be treated with medicines, if diet and exercise are not effective.  If you smoke, find out from your caregiver how to quit. If you do not use tobacco, do not start.  If you  choose to drink alcohol, do not exceed 2 drinks per day. One drink is considered to be 12 ounces (355 mL) of beer, 5 ounces (148 mL) of wine, or 1.5 ounces (44 mL) of liquor.  Avoid use of street drugs. Do not share needles with anyone. Ask for help if you need support or instructions about stopping the use of drugs.  High blood pressure causes heart disease and increases the risk of stroke. Blood pressure should be checked at least every 1 to 2 years. Ongoing high blood pressure should be treated with medicines if weight loss and exercise are not effective.  If you are 45 to 68 years old, ask your caregiver if you should take aspirin to prevent heart disease.  Diabetes screening involves taking a blood sample to check your fasting blood sugar level. This should be done once every 3 years, after age 45, if you are within normal weight and without risk factors for diabetes. Testing should be considered at a younger age or be carried out more frequently if you are overweight and have at least 1 risk factor for diabetes.  Colorectal cancer can be detected and often prevented. Most routine colorectal cancer screening begins at the age of 50 and continues through age 75. However, your caregiver may recommend screening at an earlier age if you have risk factors for colon cancer. On a yearly basis, your caregiver may provide home test kits to check for hidden blood in the stool. Use of a small camera at the end of a tube,   to directly examine the colon (sigmoidoscopy or colonoscopy), can detect the earliest forms of colorectal cancer. Talk to your caregiver about this at age 50, when routine screening begins. Direct examination of the colon should be repeated every 5 to 10 years through age 75, unless early forms of pre-cancerous polyps or small growths are found.  Hepatitis C blood testing is recommended for all people born from 1945 through 1965 and any individual with known risks for hepatitis C.  Healthy  men should no longer receive prostate-specific antigen (PSA) blood tests as part of routine cancer screening. Consult with your caregiver about prostate cancer screening.  Testicular cancer screening is not recommended for adolescents or adult males who have no symptoms. Screening includes self-exam, caregiver exam, and other screening tests. Consult with your caregiver about any symptoms you have or any concerns you have about testicular cancer.  Practice safe sex. Use condoms and avoid high-risk sexual practices to reduce the spread of sexually transmitted infections (STIs).  Use sunscreen with a sun protection factor (SPF) of 30 or greater. Apply sunscreen liberally and repeatedly throughout the day. You should seek shade when your shadow is shorter than you. Protect yourself by wearing long sleeves, pants, a wide-brimmed hat, and sunglasses year round, whenever you are outdoors.  Notify your caregiver of new moles or changes in moles, especially if there is a change in shape or color. Also notify your caregiver if a mole is larger than the size of a pencil eraser.  A one-time screening for abdominal aortic aneurysm (AAA) and surgical repair of large AAAs by sound wave imaging (ultrasonography) is recommended for ages 65 to 75 years who are current or former smokers.  Stay current with your immunizations. Document Released: 06/10/2008 Document Revised: 03/06/2012 Document Reviewed: 05/10/2011 [employer] Patient Information 2014 [employer].  

## 2013-05-26 LAB — VITAMIN D 25 HYDROXY (VIT D DEFICIENCY, FRACTURES): Vit D, 25-Hydroxy: 35 ng/mL (ref 30–89)

## 2013-05-29 LAB — NMR LIPOPROFILE WITH LIPIDS
Cholesterol, Total: 120 mg/dL (ref <200)
HDL Particle Number: 29.3 umol/L — ABNORMAL LOW (ref >=30.5)
HDL-C: 32 mg/dL — ABNORMAL LOW (ref >=40)
LDL (calc): 49 mg/dL (ref <100)
LDL Particle Number: 856 nmol/L (ref <1000)
LDL Size: 20.2 nm — ABNORMAL LOW (ref >20.5)
LP-IR Score: 75 — ABNORMAL HIGH (ref <=45)
VLDL Size: 48.5 nm — ABNORMAL HIGH (ref <=46.6)

## 2013-06-14 ENCOUNTER — Encounter: Payer: Self-pay | Admitting: *Deleted

## 2013-09-10 ENCOUNTER — Encounter: Payer: Self-pay | Admitting: Nurse Practitioner

## 2013-09-10 ENCOUNTER — Ambulatory Visit (INDEPENDENT_AMBULATORY_CARE_PROVIDER_SITE_OTHER): Payer: Medicare Other | Admitting: Nurse Practitioner

## 2013-09-10 VITALS — BP 129/74 | HR 58 | Temp 97.1°F | Ht 70.0 in | Wt 233.0 lb

## 2013-09-10 DIAGNOSIS — Z125 Encounter for screening for malignant neoplasm of prostate: Secondary | ICD-10-CM

## 2013-09-10 DIAGNOSIS — E559 Vitamin D deficiency, unspecified: Secondary | ICD-10-CM

## 2013-09-10 DIAGNOSIS — E785 Hyperlipidemia, unspecified: Secondary | ICD-10-CM

## 2013-09-10 DIAGNOSIS — I10 Essential (primary) hypertension: Secondary | ICD-10-CM

## 2013-09-10 MED ORDER — CLOPIDOGREL BISULFATE 75 MG PO TABS: 75.0000 mg | ORAL_TABLET | Freq: Every day | ORAL | Status: DC

## 2013-09-10 MED ORDER — METOPROLOL TARTRATE 50 MG PO TABS: 50.0000 mg | ORAL_TABLET | Freq: Every day | ORAL | Status: DC

## 2013-09-10 MED ORDER — SIMVASTATIN 20 MG PO TABS: 10.0000 mg | ORAL_TABLET | Freq: Two times a day (BID) | ORAL | Status: DC

## 2013-09-10 NOTE — Progress Notes (Signed)
  Subjective:    Patient ID: [name], male    DOB: 05/20/1946, 67 y.o.   MRN: [id number]  Hypertension This is a chronic problem. The current episode started more than 1 year ago. The problem is unchanged. The problem is controlled. Pertinent negatives include no blurred vision, chest pain, headaches, palpitations, peripheral edema or shortness of breath. There are no associated agents to hypertension. Risk factors for coronary artery disease include dyslipidemia, obesity, post-menopausal state and male gender. Past treatments include beta blockers. The current treatment provides moderate improvement. Hypertensive end-organ damage includes CAD/MI and CVA (2010).  Hyperlipidemia This is a chronic problem. The problem is controlled. Recent lipid tests were reviewed and are normal. There are no known factors aggravating his hyperlipidemia. Pertinent negatives include no chest pain or shortness of breath. Current antihyperlipidemic treatment includes statins. The current treatment provides moderate improvement of lipids. Compliance problems include adherence to diet and adherence to exercise.  Risk factors for coronary artery disease include hypertension, male sex and obesity.  CAD Plavix working wwelll- no c/o bleeding depression Celexa working well with no side effects  Review of Systems  Eyes: Negative for blurred vision.  Respiratory: Negative for shortness of breath.   Cardiovascular: Negative for chest pain and palpitations.  Neurological: Negative for headaches.  All other systems reviewed and are negative.       Objective:   Physical Exam  Constitutional: He is oriented to person, place, and time. He appears well-developed and well-nourished.  HENT:  Head: Normocephalic.  Right Ear: External ear normal.  Left Ear: External ear normal.  Nose: Nose normal.  Mouth/Throat: Oropharynx is clear and moist.  Eyes: EOM are normal. Pupils are equal, round, and reactive to light.  Neck:  Normal range of motion. Neck supple. No thyromegaly present.  Cardiovascular: Normal rate, regular rhythm, normal heart sounds and intact distal pulses.   No murmur heard. Pulmonary/Chest: Effort normal and breath sounds normal. He has no wheezes. He has no rales.  Abdominal: Soft. Bowel sounds are normal.  Genitourinary:  Refuses prostate check  Musculoskeletal: Normal range of motion.  Neurological: He is alert and oriented to person, place, and time.  Skin: Skin is warm and dry.  Psychiatric: He has a normal mood and affect. His behavior is normal. Judgment and thought content normal.   BP 129/74  Pulse 58  Temp(Src) 97.1 F (36.2 C) (Oral)  Ht 5\' 10"  (1.778 m)  Wt 233 lb (105.688 kg)  BMI 33.43 kg/m2       Assessment & Plan:   No diagnosis found.    Medication List       This list is accurate as of: 09/10/13  8:40 AM.  Always use your most recent med list.               aspirin 81 MG tablet  Take 81 mg by mouth daily.     clopidogrel 75 MG tablet  Commonly known as:  PLAVIX  Take 75 mg by mouth daily.     metoprolol 50 MG tablet  Commonly known as:  LOPRESSOR  Take 50 mg by mouth daily.     simvastatin 20 MG tablet  Commonly known as:  ZOCOR  Take 10 mg by mouth 2 (two) times daily.       Continue all meds Labs pending Exercise Follow-up in 3 months  Mary-Margaret Daphine Deutscher, FNP

## 2013-09-10 NOTE — Patient Instructions (Signed)
Health Maintenance, Males A healthy lifestyle and preventative care can promote health and wellness.  Maintain regular health, dental, and eye exams.  Eat a healthy diet. Foods like vegetables, fruits, whole grains, low-fat dairy products, and lean protein foods contain the nutrients you need without too many calories. Decrease your intake of foods high in solid fats, added sugars, and salt. Get information about a proper diet from your caregiver, if necessary.  Regular physical exercise is one of the most important things you can do for your health. Most adults should get at least 150 minutes of moderate-intensity exercise (any activity that increases your heart rate and causes you to sweat) each week. In addition, most adults need muscle-strengthening exercises on 2 or more days a week.   Maintain a healthy weight. The body mass index (BMI) is a screening tool to identify possible weight problems. It provides an estimate of body fat based on height and weight. Your caregiver can help determine your BMI, and can help you achieve or maintain a healthy weight. For adults 20 years and older:  A BMI below 18.5 is considered underweight.  A BMI of 18.5 to 24.9 is normal.  A BMI of 25 to 29.9 is considered overweight.  A BMI of 30 and above is considered obese.  Maintain normal blood lipids and cholesterol by exercising and minimizing your intake of saturated fat. Eat a balanced diet with plenty of fruits and vegetables. Blood tests for lipids and cholesterol should begin at age 20 and be repeated every 5 years. If your lipid or cholesterol levels are high, you are over 50, or you are a high risk for heart disease, you may need your cholesterol levels checked more frequently.Ongoing high lipid and cholesterol levels should be treated with medicines, if diet and exercise are not effective.  If you smoke, find out from your caregiver how to quit. If you do not use tobacco, do not start.  If you  choose to drink alcohol, do not exceed 2 drinks per day. One drink is considered to be 12 ounces (355 mL) of beer, 5 ounces (148 mL) of wine, or 1.5 ounces (44 mL) of liquor.  Avoid use of street drugs. Do not share needles with anyone. Ask for help if you need support or instructions about stopping the use of drugs.  High blood pressure causes heart disease and increases the risk of stroke. Blood pressure should be checked at least every 1 to 2 years. Ongoing high blood pressure should be treated with medicines if weight loss and exercise are not effective.  If you are 45 to 68 years old, ask your caregiver if you should take aspirin to prevent heart disease.  Diabetes screening involves taking a blood sample to check your fasting blood sugar level. This should be done once every 3 years, after age 45, if you are within normal weight and without risk factors for diabetes. Testing should be considered at a younger age or be carried out more frequently if you are overweight and have at least 1 risk factor for diabetes.  Colorectal cancer can be detected and often prevented. Most routine colorectal cancer screening begins at the age of 50 and continues through age 75. However, your caregiver may recommend screening at an earlier age if you have risk factors for colon cancer. On a yearly basis, your caregiver may provide home test kits to check for hidden blood in the stool. Use of a small camera at the end of a tube,   to directly examine the colon (sigmoidoscopy or colonoscopy), can detect the earliest forms of colorectal cancer. Talk to your caregiver about this at age 50, when routine screening begins. Direct examination of the colon should be repeated every 5 to 10 years through age 75, unless early forms of pre-cancerous polyps or small growths are found.  Hepatitis C blood testing is recommended for all people born from 1945 through 1965 and any individual with known risks for hepatitis C.  Healthy  men should no longer receive prostate-specific antigen (PSA) blood tests as part of routine cancer screening. Consult with your caregiver about prostate cancer screening.  Testicular cancer screening is not recommended for adolescents or adult males who have no symptoms. Screening includes self-exam, caregiver exam, and other screening tests. Consult with your caregiver about any symptoms you have or any concerns you have about testicular cancer.  Practice safe sex. Use condoms and avoid high-risk sexual practices to reduce the spread of sexually transmitted infections (STIs).  Use sunscreen with a sun protection factor (SPF) of 30 or greater. Apply sunscreen liberally and repeatedly throughout the day. You should seek shade when your shadow is shorter than you. Protect yourself by wearing long sleeves, pants, a wide-brimmed hat, and sunglasses year round, whenever you are outdoors.  Notify your caregiver of new moles or changes in moles, especially if there is a change in shape or color. Also notify your caregiver if a mole is larger than the size of a pencil eraser.  A one-time screening for abdominal aortic aneurysm (AAA) and surgical repair of large AAAs by sound wave imaging (ultrasonography) is recommended for ages 65 to 75 years who are current or former smokers.  Stay current with your immunizations. Document Released: 06/10/2008 Document Revised: 03/06/2012 Document Reviewed: 05/10/2011 [employer] Patient Information 2014 [employer].  

## 2013-09-11 LAB — CMP14+EGFR
ALT: 16 IU/L (ref 0–44)
AST: 17 IU/L (ref 0–40)
CO2: 24 mmol/L (ref 18–29)
Calcium: 9.4 mg/dL (ref 8.6–10.2)
Chloride: 100 mmol/L (ref 97–108)
GFR calc non Af Amer: 88 mL/min/{1.73_m2} (ref >59)
Glucose: 86 mg/dL (ref 65–99)
Potassium: 4.6 mmol/L (ref 3.5–5.2)
Sodium: 140 mmol/L (ref 134–144)
Total Protein: 6.5 g/dL (ref 6.0–8.5)

## 2013-09-11 LAB — NMR, LIPOPROFILE
HDL Cholesterol by NMR: 37 mg/dL — ABNORMAL LOW (ref >=40)
LDL Particle Number: 857 nmol/L (ref <1000)
LDLC SERPL CALC-MCNC: 57 mg/dL (ref <100)
Small LDL Particle Number: 409 nmol/L (ref <=527)
Triglycerides by NMR: 163 mg/dL — ABNORMAL HIGH (ref <150)

## 2013-09-11 LAB — PSA, TOTAL AND FREE
PSA, Free Pct: 38.9 %
PSA, Free: 0.35 ng/mL
PSA: 0.9 ng/mL (ref 0.0–4.0)

## 2013-10-18 ENCOUNTER — Telehealth: Payer: Self-pay | Admitting: Nurse Practitioner

## 2013-10-18 NOTE — Telephone Encounter (Signed)
Will have to ask pharmacy

## 2013-10-19 ENCOUNTER — Other Ambulatory Visit: Payer: Self-pay | Admitting: Nurse Practitioner

## 2013-10-19 MED ORDER — METOPROLOL SUCCINATE ER 50 MG PO TB24: 50.0000 mg | ORAL_TABLET | Freq: Every day | ORAL | Status: DC

## 2013-10-19 NOTE — Telephone Encounter (Signed)
Patient aware.

## 2013-10-19 NOTE — Telephone Encounter (Signed)
Already took care of this and sent new rx to pharmacy [facility] today

## 2013-10-19 NOTE — Telephone Encounter (Signed)
ITS not ER is it still okay to take

## 2013-10-22 ENCOUNTER — Other Ambulatory Visit (INDEPENDENT_AMBULATORY_CARE_PROVIDER_SITE_OTHER): Payer: Medicare Other | Admitting: *Deleted

## 2013-10-22 ENCOUNTER — Ambulatory Visit: Payer: Medicare Other

## 2013-10-22 DIAGNOSIS — Z23 Encounter for immunization: Secondary | ICD-10-CM

## 2013-10-30 ENCOUNTER — Ambulatory Visit (INDEPENDENT_AMBULATORY_CARE_PROVIDER_SITE_OTHER): Payer: Medicare Other

## 2013-10-30 ENCOUNTER — Ambulatory Visit (INDEPENDENT_AMBULATORY_CARE_PROVIDER_SITE_OTHER): Payer: Medicare Other | Admitting: Family Medicine

## 2013-10-30 ENCOUNTER — Encounter: Payer: Self-pay | Admitting: Family Medicine

## 2013-10-30 VITALS — BP 107/72 | HR 75 | Temp 97.4°F | Ht 71.0 in | Wt 227.0 lb

## 2013-10-30 DIAGNOSIS — S8012XA Contusion of left lower leg, initial encounter: Secondary | ICD-10-CM

## 2013-10-30 DIAGNOSIS — L0291 Cutaneous abscess, unspecified: Secondary | ICD-10-CM

## 2013-10-30 DIAGNOSIS — S8010XA Contusion of unspecified lower leg, initial encounter: Secondary | ICD-10-CM

## 2013-10-30 DIAGNOSIS — L0390 Cellulitis, unspecified: Secondary | ICD-10-CM

## 2013-10-30 LAB — POCT CBC
Granulocyte percent: 75.8 %G (ref 37–80)
HCT, POC: 47.4 % (ref 43.5–53.7)
Hemoglobin: 16.2 g/dL (ref 14.1–18.1)
Lymph, poc: 2 (ref 0.6–3.4)
MCH, POC: 30.5 pg (ref 27–31.2)
MCHC: 34.1 g/dL (ref 31.8–35.4)
MCV: 89.3 fL (ref 80–97)
MPV: 7.4 fL (ref 0–99.8)
POC Granulocyte: 7.3 — AB (ref 2–6.9)
POC LYMPH PERCENT: 21.2 %L (ref 10–50)
Platelet Count, POC: 199 10*3/uL (ref 142–424)
RBC: 5.3 M/uL (ref 4.69–6.13)
RDW, POC: 13 %
WBC: 9.6 10*3/uL (ref 4.6–10.2)

## 2013-10-30 NOTE — Progress Notes (Signed)
Patient ID: Dondra Prader, male   DOB: Apr 03, 1946, 67 y.o.   MRN: [id number] SUBJECTIVE: CC: Chief Complaint  Patient presents with  . Leg Pain    RIGHT, FELL 1 WEEK AGO     HPI: Tripped over electrical cord 1 week a go and hit the left shin. The left leg was fractured in the past in football. Leg has been red a few days wonders if it is infected. Past Medical History  Diagnosis Date  . CVA (cerebral vascular accident)   . Coronary artery disease     S/P NSTEMI and CABGS for LM and 3v CAD 06/2009  . Carotid artery occlusion     right carotid stenting in 2007  . Hyperlipidemia   . Obesity   . Acid reflux   . Chest pain    Past Surgical History  Procedure Laterality Date  . Coronary artery bypass graft     History   Social History  . Marital Status: Married    Spouse Name: N/A    Number of Children: N/A  . Years of Education: N/A   Occupational History  . Not on file.   Social History Main Topics  . Smoking status: Former Smoker    Types: Cigarettes    Quit date: 12/27/2005  . Smokeless tobacco: Not on file  . Alcohol Use: No  . Drug Use: No  . Sexual Activity: Not on file   Other Topics Concern  . Not on file   Social History Narrative  . No narrative on file   Family History  Problem Relation Age of Onset  . Heart attack Father   . Heart attack Brother    Current Outpatient Prescriptions on File Prior to Visit  Medication Sig Dispense Refill  . aspirin 81 MG tablet Take 81 mg by mouth daily.        . clopidogrel (PLAVIX) 75 MG tablet Take 1 tablet (75 mg total) by mouth daily.  90 tablet  1  . metoprolol succinate (TOPROL-XL) 50 MG 24 hr tablet Take 1 tablet (50 mg total) by mouth daily. Take with or immediately following a meal.  90 tablet  3  . simvastatin (ZOCOR) 20 MG tablet Take 0.5 tablets (10 mg total) by mouth 2 (two) times daily.  90 tablet  1   No current facility-administered medications on file prior to visit.   Allergies  Allergen  Reactions  . Atorvastatin   . Crestor [Rosuvastatin Calcium]   . Iodinated Diagnostic Agents   . Niaspan [Niacin Er]    Immunization History  Administered Date(s) Administered  . Influenza,inj,Quad PF,36+ Mos 10/22/2013  . Pneumococcal Conjugate 10/22/2013  . Pneumococcal Polysaccharide 11/03/2010   Prior to Admission medications   Medication Sig Start Date End Date Taking? Authorizing Provider  aspirin 81 MG tablet Take 81 mg by mouth daily.     Yes Historical Provider, MD  clopidogrel (PLAVIX) 75 MG tablet Take 1 tablet (75 mg total) by mouth daily. 09/10/13  Yes Mary-Margaret Daphine Deutscher, FNP  metoprolol succinate (TOPROL-XL) 50 MG 24 hr tablet Take 1 tablet (50 mg total) by mouth daily. Take with or immediately following a meal. 10/19/13  Yes Mary-Margaret Daphine Deutscher, FNP  simvastatin (ZOCOR) 20 MG tablet Take 0.5 tablets (10 mg total) by mouth 2 (two) times daily. 09/10/13  Yes Mary-Margaret Daphine Deutscher, FNP     ROS: As above in the HPI. All other systems are stable or negative.  OBJECTIVE: APPEARANCE:  Patient in no acute distress.The patient appeared  well nourished and normally developed. Acyanotic. Waist: VITAL SIGNS:BP 107/72  Pulse 75  Temp(Src) 97.4 F (36.3 C) (Oral)  Ht 5\' 11"  (1.803 m)  Wt 227 lb (102.967 kg)  BMI 31.67 kg/m2  WM obese SKIN: warm and  Dry   HEAD and Neck: without JVD, Head and scalp: normal Eyes:No scleral icterus. Fundi normal, eye movements normal. Ears: Auricle normal, canal normal, Tympanic membranes normal, insufflation normal. Nose: normal Throat: normal Neck & thyroid: normal  CHEST & LUNGS: Chest wall: normal Lungs: Clear  CVS: Reveals the PMI to be normally located. Regular rhythm, First and Second Heart sounds are normal,  absence of murmurs, rubs or gallops. Peripheral vasculature: Radial pulses: normal Dorsal pedis pulses: normal Posterior pulses: normal  ABDOMEN:  Appearance: Obese Benign, no organomegaly, no masses, no  Abdominal Aortic enlargement. No Guarding , no rebound. No Bruits. Bowel sounds: normal  RECTAL: N/A GU: N/A  EXTREMETIES: scars left mid leg, with soft tissue swelling and redness. See the photo in Media. No temperature difference compared to the other leg.  NEUROLOGIC: oriented to time,place and person; nonfocal. Strength is normal Sensory is normal Reflexes are normal Cranial Nerves are normal.  ASSESSMENT: Contusion of leg, left, initial encounter - Plan: DG Tibia/Fibula Left  Cellulitis - Plan: POCT CBC Doubt any infection. I suspect that there is a hematoma in the soft tissues producing an inflammatory response.  PLAN:  Orders Placed This Encounter  Procedures  . DG Tibia/Fibula Left    Standing Status: Future     Number of Occurrences: 1     Standing Expiration Date: 12/30/2014    Order Specific Question:  Reason for Exam (SYMPTOM  OR DIAGNOSIS REQUIRED)    Answer:  contusion left leg    Order Specific Question:  Preferred imaging location?    Answer:  Internal  . POCT CBC  [facility] reading (PRIMARY) by  Dr. Modesto Charon old healed fracture mid shaft tib-fib.                                  No orders of the defined types were placed in this encounter.   There are no discontinued medications. No Follow-up on file.      Dr Woodroe Mode Recommendations  For nutrition information, I recommend books:  1).Eat to Live by Dr Monico Hoar. 2).Prevent and Reverse Heart Disease by Dr Suzzette Righter. 3) Dr Katherina Right Book:  Program to Reverse Diabetes  Exercise recommendations are:  If unable to walk, then the patient can exercise in a chair 3 times a day. By flapping arms like a bird gently and raising legs outwards to the front.  If ambulatory, the patient can go for walks for 30 minutes 3 times a week. Then increase the intensity and duration as tolerated.  Goal is to try to attain exercise frequency to 5 times a week.  If applicable: Best to perform resistance  exercises (machines or weights) 2 days a week and cardio type exercises 3 days per week.   Elevate and ice your left leg. 15 minutes every 2 hours.  Tzvi Economou P. Modesto Charon, M.D.

## 2013-10-30 NOTE — Patient Instructions (Addendum)
      Dr Woodroe Mode Recommendations  For nutrition information, I recommend books:  1).Eat to Live by Dr Monico Hoar. 2).Prevent and Reverse Heart Disease by Dr Suzzette Righter. 3) Dr Katherina Right Book:  Program to Reverse Diabetes  Exercise recommendations are:  If unable to walk, then the patient can exercise in a chair 3 times a day. By flapping arms like a bird gently and raising legs outwards to the front.  If ambulatory, the patient can go for walks for 30 minutes 3 times a week. Then increase the intensity and duration as tolerated.  Goal is to try to attain exercise frequency to 5 times a week.  If applicable: Best to perform resistance exercises (machines or weights) 2 days a week and cardio type exercises 3 days per week.   Elevate and ice your left leg. 15 minutes every 2 hours.

## 2013-10-31 ENCOUNTER — Ambulatory Visit (INDEPENDENT_AMBULATORY_CARE_PROVIDER_SITE_OTHER): Payer: Medicare Other | Admitting: Cardiology

## 2013-10-31 ENCOUNTER — Encounter: Payer: Self-pay | Admitting: Cardiology

## 2013-10-31 VITALS — BP 124/82 | HR 98 | Ht 71.0 in | Wt 232.0 lb

## 2013-10-31 DIAGNOSIS — I4891 Unspecified atrial fibrillation: Secondary | ICD-10-CM

## 2013-10-31 DIAGNOSIS — I701 Atherosclerosis of renal artery: Secondary | ICD-10-CM

## 2013-10-31 NOTE — Patient Instructions (Signed)
The current medical regimen is effective;  continue present plan and medications.  Your physician has requested that you have a renal artery duplex. During this test, an ultrasound is used to evaluate blood flow to the kidneys. Allow one hour for this exam. Do not eat after midnight the day before and avoid carbonated beverages. Take your medications as you usually do.  Follow up in 1 year with Dr Antoine Poche.  You will receive a letter in the mail 2 months before you are due.  Please call us when you receive this letter to schedule your follow up appointment.

## 2013-10-31 NOTE — Progress Notes (Signed)
HPI The patient presents for follow up of CAD.  He had CABG in July 2010 with a mildly reduced EF.  Since I last saw him he has done well. He has had no chest pain he had his acute MI.  He denies any chest pressure, neck or arm discomfort. He is not having any palpitations, presyncope or syncope. He denies any shortness of breath, PND or orthopnea. He is somewhat limited by foot problems and left-sided weakness from previous CVA.  However, he does yard work. He pedals the stationary bike rarely.  Allergies  Allergen Reactions  . Atorvastatin   . Crestor [Rosuvastatin Calcium]   . Iodinated Diagnostic Agents   . Niaspan [Niacin Er]     Current Outpatient Prescriptions  Medication Sig Dispense Refill  . aspirin 81 MG tablet Take 81 mg by mouth daily.        . clopidogrel (PLAVIX) 75 MG tablet Take 1 tablet (75 mg total) by mouth daily.  90 tablet  1  . metoprolol succinate (TOPROL-XL) 50 MG 24 hr tablet Take 1 tablet (50 mg total) by mouth daily. Take with or immediately following a meal.  90 tablet  3  . simvastatin (ZOCOR) 20 MG tablet Take 0.5 tablets (10 mg total) by mouth 2 (two) times daily.  90 tablet  1   No current facility-administered medications for this visit.    Past Medical History  Diagnosis Date  . CVA (cerebral vascular accident)   . Coronary artery disease     S/P NSTEMI and CABGS for LM and 3v CAD 06/2009  . Carotid artery occlusion     right carotid stenting in 2007  . Hyperlipidemia   . Obesity   . Acid reflux   . Chest pain     Past Surgical History  Procedure Laterality Date  . Coronary artery bypass graft      ROS:  As stated in the HPI and negative for all other systems.  PHYSICAL EXAM BP 124/82  Pulse 98  Ht 5\' 11"  (1.803 m)  Wt 232 lb (105.235 kg)  BMI 32.37 kg/m2 GENERAL:  Well appearing HEENT:  Pupils equal round and reactive, fundi not visualized, oral mucosa unremarkable, dentures NECK:  No jugular venous distention, waveform within  normal limits, carotid upstroke brisk and symmetric, no bruits, no thyromegaly LUNGS:  Clear to auscultation bilaterally BACK:  No CVA tenderness CHEST:  Well healed sternotomy scar. HEART:  PMI not displaced or sustained,S1 and S2 within normal limits, no S3, no S4, no clicks, no rubs, no murmurs ABD:  Flat, positive bowel sounds normal in frequency in pitch, no bruits, no rebound, no guarding, no midline pulsatile mass, no hepatomegaly, no splenomegaly EXT:  2 plus pulses upper, decreased DP/PT bilateral, mild left greater than right edema, no cyanosis no clubbing NEURO:  Left foot weakness   EKG:  Sinus rhythm, rate 85, axis within normal limits, intervals within normal limits, old anteroseptal MI. No change from previous exceptt a PVC is now present  10/31/2013   ASSESSMENT AND PLAN  CAD, AUTOLOGOUS BYPASS GRAFT -  The patient has no new sypmtoms. No further cardiovascular testing is indicated. We will continue with aggressive risk reduction and meds as listed.   HYPERLIPIDEMIA-MIXED -  His last LDL 57. He will continue on the meds as listed.  CVA -  He had less than 39% stenosis bilaterally and will be checked again in 2015 November.  OBESITY -  We had along discussion about diet.  Cardiomyopathy -  He has a mildly reduced ejection fraction on echo in 2010. However, he has no symptoms. At this point we'll continue beta blockers. No further imaging is indicated.   RAS - He is overdue for follow up ultrasound of this.

## 2013-11-12 ENCOUNTER — Ambulatory Visit (HOSPITAL_COMMUNITY): Payer: Medicare Other | Attending: Cardiology

## 2013-11-12 DIAGNOSIS — Z8673 Personal history of transient ischemic attack (TIA), and cerebral infarction without residual deficits: Secondary | ICD-10-CM | POA: Insufficient documentation

## 2013-11-12 DIAGNOSIS — Z87891 Personal history of nicotine dependence: Secondary | ICD-10-CM | POA: Insufficient documentation

## 2013-11-12 DIAGNOSIS — I658 Occlusion and stenosis of other precerebral arteries: Secondary | ICD-10-CM | POA: Insufficient documentation

## 2013-11-12 DIAGNOSIS — J449 Chronic obstructive pulmonary disease, unspecified: Secondary | ICD-10-CM | POA: Insufficient documentation

## 2013-11-12 DIAGNOSIS — I6529 Occlusion and stenosis of unspecified carotid artery: Secondary | ICD-10-CM | POA: Insufficient documentation

## 2013-11-12 DIAGNOSIS — Z951 Presence of aortocoronary bypass graft: Secondary | ICD-10-CM | POA: Insufficient documentation

## 2013-11-12 DIAGNOSIS — J4489 Other specified chronic obstructive pulmonary disease: Secondary | ICD-10-CM | POA: Insufficient documentation

## 2013-11-12 DIAGNOSIS — E785 Hyperlipidemia, unspecified: Secondary | ICD-10-CM | POA: Insufficient documentation

## 2013-11-12 DIAGNOSIS — I10 Essential (primary) hypertension: Secondary | ICD-10-CM | POA: Insufficient documentation

## 2013-11-12 DIAGNOSIS — I2510 Atherosclerotic heart disease of native coronary artery without angina pectoris: Secondary | ICD-10-CM | POA: Insufficient documentation

## 2013-11-12 DIAGNOSIS — I700 Atherosclerosis of aorta: Secondary | ICD-10-CM

## 2013-11-12 DIAGNOSIS — I701 Atherosclerosis of renal artery: Secondary | ICD-10-CM | POA: Insufficient documentation

## 2013-12-03 ENCOUNTER — Telehealth: Payer: Self-pay | Admitting: Cardiology

## 2013-12-03 NOTE — Telephone Encounter (Signed)
New Problem:  Pt states he is calling to hear his recent test results. Pt is requesting a call back from the nurse.

## 2013-12-03 NOTE — Telephone Encounter (Signed)
Provided patient with results from the Renal Artery Duplex study from 11/13/13 - per Dr. Jenene Slicker note on the study results. Patient verbalized understanding. Medications were reveiwed and the importance of staying on the current treatment plan was emphasized regarding the existing renal and iliac stenosis status.

## 2013-12-03 NOTE — Progress Notes (Signed)
Quick Note:  Provided patient with results from the Renal Artery Duplex study from 11/13/13 - per Dr. Jenene Slicker note on the study results. Patient verbalized understanding. Medications were reveiwed and the importance of staying on the current treatment plan was emphasized regarding the existing renal and iliac stenosis status. ______

## 2014-03-19 ENCOUNTER — Telehealth: Payer: Self-pay | Admitting: Family Medicine

## 2014-03-19 MED ORDER — CITALOPRAM HYDROBROMIDE 40 MG PO TABS: 40.0000 mg | ORAL_TABLET | Freq: Every day | ORAL | Status: DC

## 2014-03-19 NOTE — Telephone Encounter (Signed)
Not on med list

## 2014-06-19 ENCOUNTER — Other Ambulatory Visit: Payer: Self-pay | Admitting: *Deleted

## 2014-06-19 NOTE — Telephone Encounter (Signed)
Wants 90, but last seen 08/2013

## 2014-06-20 MED ORDER — CLOPIDOGREL BISULFATE 75 MG PO TABS: 75.0000 mg | ORAL_TABLET | Freq: Every day | ORAL | Status: DC

## 2014-08-16 ENCOUNTER — Other Ambulatory Visit: Payer: Self-pay | Admitting: *Deleted

## 2014-08-16 MED ORDER — CITALOPRAM HYDROBROMIDE 40 MG PO TABS: 40.0000 mg | ORAL_TABLET | Freq: Every day | ORAL | Status: DC

## 2014-09-19 ENCOUNTER — Other Ambulatory Visit: Payer: Self-pay | Admitting: Nurse Practitioner

## 2014-09-20 ENCOUNTER — Telehealth: Payer: Self-pay | Admitting: *Deleted

## 2014-09-20 NOTE — Telephone Encounter (Signed)
Aware. 

## 2014-09-20 NOTE — Telephone Encounter (Signed)
no more refills without being seen  

## 2014-10-01 ENCOUNTER — Ambulatory Visit (INDEPENDENT_AMBULATORY_CARE_PROVIDER_SITE_OTHER): Payer: Medicare Other

## 2014-10-01 DIAGNOSIS — Z23 Encounter for immunization: Secondary | ICD-10-CM

## 2014-11-06 ENCOUNTER — Ambulatory Visit (INDEPENDENT_AMBULATORY_CARE_PROVIDER_SITE_OTHER): Payer: Medicare Other | Admitting: Cardiology

## 2014-11-06 ENCOUNTER — Encounter: Payer: Self-pay | Admitting: Cardiology

## 2014-11-06 VITALS — BP 130/70 | HR 60 | Ht 70.0 in | Wt 227.0 lb

## 2014-11-06 DIAGNOSIS — I6523 Occlusion and stenosis of bilateral carotid arteries: Secondary | ICD-10-CM

## 2014-11-06 DIAGNOSIS — I25810 Atherosclerosis of coronary artery bypass graft(s) without angina pectoris: Secondary | ICD-10-CM

## 2014-11-06 MED ORDER — SIMVASTATIN 20 MG PO TABS: 10.0000 mg | ORAL_TABLET | Freq: Two times a day (BID) | ORAL | Status: DC

## 2014-11-06 MED ORDER — CLOPIDOGREL BISULFATE 75 MG PO TABS: ORAL_TABLET | ORAL | Status: DC

## 2014-11-06 MED ORDER — METOPROLOL SUCCINATE ER 50 MG PO TB24: 50.0000 mg | ORAL_TABLET | Freq: Every day | ORAL | Status: DC

## 2014-11-06 NOTE — Patient Instructions (Signed)
The current medical regimen is effective;  continue present plan and medications.  Your physician has requested that you have a carotid duplex. This test is an ultrasound of the carotid arteries in your neck. It looks at blood flow through these arteries that supply the brain with blood. Allow one hour for this exam. There are no restrictions or special instructions.  Follow up in 1 year with Dr. Percival Spanish in [place].  You will receive a letter in the mail 2 months before you are due.  Please call us when you receive this letter to schedule your follow up appointment.

## 2014-11-06 NOTE — Progress Notes (Signed)
HPI The patient presents for follow up of CAD.  He had CABG in July 2010 with a mildly reduced EF.  Since I last saw him he has done well. He has had no chest pain he had his acute MI.  He denies any chest pressure, neck or arm discomfort. He is not having any palpitations, presyncope or syncope. He denies any shortness of breath, PND or orthopnea.   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    Allergies  Allergen Reactions  . Atorvastatin   . Crestor [Rosuvastatin Calcium]   . Iodinated Diagnostic Agents   . Niaspan [Niacin Er]     Current Outpatient Prescriptions  Medication Sig Dispense Refill  . aspirin 81 MG tablet Take 81 mg by mouth daily.      . citalopram (CELEXA) 40 MG tablet Take 1 tablet (40 mg total) by mouth daily. 30 tablet 0  . clopidogrel (PLAVIX) 75 MG tablet TAKE ONE TABLET BY MOUTH ONCE DAILY 90 tablet 0  . metoprolol succinate (TOPROL-XL) 50 MG 24 hr tablet Take 1 tablet (50 mg total) by mouth daily. Take with or immediately following a meal. (Patient taking differently: Take 50 mg by mouth daily. Take 1/2 tablet with or immediately following a meal.) 90 tablet 3  . simvastatin (ZOCOR) 20 MG tablet Take 0.5 tablets (10 mg total) by mouth 2 (two) times daily. 90 tablet 1   No current facility-administered medications for this visit.    Past Medical History  Diagnosis Date  . CVA (cerebral vascular accident)   . Coronary artery disease     S/P NSTEMI and CABGS for LM and 3v CAD 06/2009  . Carotid artery occlusion     right carotid stenting in 2007  . Hyperlipidemia   . Obesity   . Acid reflux   . Chest pain     Past Surgical History  Procedure Laterality Date  . Coronary artery bypass graft      ROS:  As stated in the HPI and negative for all other systems.  PHYSICAL EXAM BP 130/70 mmHg  Pulse 60  Ht 5\' 10"  (1.778 m)  Wt 227 lb (102.967 kg)  BMI 32.57 kg/m2 GENERAL:  Well appearing HEENT:  Pupils equal round and reactive,  fundi not visualized, oral mucosa unremarkable, dentures NECK:  No jugular venous distention, waveform within normal limits, carotid upstroke brisk and symmetric, no bruits, no thyromegaly LUNGS:  Clear to auscultation bilaterally BACK:  No CVA tenderness CHEST:  Well healed sternotomy scar. HEART:  PMI not displaced or sustained,S1 and S2 within normal limits, no S3, no S4, no clicks, no rubs, no murmurs ABD:  Flat, positive bowel sounds normal in frequency in pitch, no bruits, no rebound, no guarding, no midline pulsatile mass, no hepatomegaly, no splenomegaly EXT:  2 plus pulses upper, decreased DP/PT bilateral, mild left greater than right edema, no cyanosis no clubbing NEURO:  Left foot weakness   EKG:  Sinus rhythm, rate 60, axis within normal limits, intervals within normal limits, old anteroseptal MI.  11/06/2014   ASSESSMENT AND PLAN  CAD, AUTOLOGOUS BYPASS GRAFT -  The patient has no new sypmtoms. No further cardiovascular testing is indicated. We will continue with aggressive risk reduction and meds as listed.   HYPERLIPIDEMIA-MIXED -  He has not had his lipids checked since September of last year and he is given instructions to get this done.  CVA -  He had less than 39% stenosis bilaterally.  This  was last checked in 2013 needed to have this done now and we will schedule it.  OBESITY -  We had along discussion about diet.   Cardiomyopathy -  He has a mildly reduced ejection fraction on echo in 2010. However, he has no symptoms. At this point we'll continue beta blockers. No further imaging is indicated.   RAS - This was stable last year. Further evaluation is planned.

## 2014-11-11 ENCOUNTER — Ambulatory Visit (HOSPITAL_COMMUNITY): Payer: Medicare Other | Attending: Cardiology | Admitting: Cardiology

## 2014-11-11 DIAGNOSIS — J449 Chronic obstructive pulmonary disease, unspecified: Secondary | ICD-10-CM | POA: Diagnosis not present

## 2014-11-11 DIAGNOSIS — I6523 Occlusion and stenosis of bilateral carotid arteries: Secondary | ICD-10-CM

## 2014-11-11 DIAGNOSIS — I2510 Atherosclerotic heart disease of native coronary artery without angina pectoris: Secondary | ICD-10-CM | POA: Diagnosis present

## 2014-11-11 DIAGNOSIS — Z87891 Personal history of nicotine dependence: Secondary | ICD-10-CM | POA: Diagnosis not present

## 2014-11-11 DIAGNOSIS — I10 Essential (primary) hypertension: Secondary | ICD-10-CM | POA: Diagnosis not present

## 2014-11-11 DIAGNOSIS — Z8673 Personal history of transient ischemic attack (TIA), and cerebral infarction without residual deficits: Secondary | ICD-10-CM | POA: Diagnosis not present

## 2014-11-11 DIAGNOSIS — E785 Hyperlipidemia, unspecified: Secondary | ICD-10-CM | POA: Insufficient documentation

## 2014-11-11 DIAGNOSIS — Z951 Presence of aortocoronary bypass graft: Secondary | ICD-10-CM | POA: Diagnosis not present

## 2014-11-11 NOTE — Progress Notes (Signed)
Carotid duplex performed 

## 2014-11-14 ENCOUNTER — Ambulatory Visit (INDEPENDENT_AMBULATORY_CARE_PROVIDER_SITE_OTHER): Payer: Medicare Other | Admitting: Family Medicine

## 2014-11-14 ENCOUNTER — Encounter: Payer: Self-pay | Admitting: Family Medicine

## 2014-11-14 ENCOUNTER — Other Ambulatory Visit: Payer: Self-pay | Admitting: Family Medicine

## 2014-11-14 VITALS — BP 119/69 | HR 72 | Temp 97.2°F | Ht 70.0 in | Wt 226.6 lb

## 2014-11-14 DIAGNOSIS — N400 Enlarged prostate without lower urinary tract symptoms: Secondary | ICD-10-CM

## 2014-11-14 DIAGNOSIS — E785 Hyperlipidemia, unspecified: Secondary | ICD-10-CM

## 2014-11-14 MED ORDER — CITALOPRAM HYDROBROMIDE 40 MG PO TABS: 40.0000 mg | ORAL_TABLET | Freq: Every day | ORAL | Status: DC

## 2014-11-14 NOTE — Addendum Note (Signed)
Addended by: Earlene Plater on: 11/14/2014 09:32 AM   Modules accepted: Miquel Dunn

## 2014-11-14 NOTE — Progress Notes (Signed)
   Subjective:    Patient ID: [name], male    DOB: 1946-05-13, 68 y.o.   MRN: [id number]  HPI  68 year old gentleman here for annual physical. He saw cardiologist last week who gave him a good report. He also recently saw a neurologist checking on the stent and his neck which by Doppler is said to be 50% patent. He denies complaints or symptoms today.    Review of Systems  Constitutional: Negative.   HENT: Negative.   Eyes: Negative.   Respiratory: Negative.  Negative for shortness of breath.   Cardiovascular: Negative.  Negative for chest pain and leg swelling.  Gastrointestinal: Negative.   Genitourinary: Negative.   Musculoskeletal: Negative.   Skin: Negative.   Neurological: Negative.   Psychiatric/Behavioral: Negative.   All other systems reviewed and are negative.      Objective:   Physical Exam  Constitutional: He is oriented to person, place, and time. He appears well-developed and well-nourished.  HENT:  Head: Normocephalic.  Right Ear: External ear normal.  Left Ear: External ear normal.  Nose: Nose normal.  Mouth/Throat: Oropharynx is clear and moist.  Eyes: Conjunctivae and EOM are normal. Pupils are equal, round, and reactive to light.  Neck: Normal range of motion. Neck supple.  Right carotid pulses palpable. I cannot hear a bruit  Cardiovascular: Normal rate, regular rhythm, normal heart sounds and intact distal pulses.   Pulmonary/Chest: Effort normal and breath sounds normal.  Abdominal: Soft. Bowel sounds are normal.  Musculoskeletal: Normal range of motion.  Neurological: He is alert and oriented to person, place, and time.  Skin: Skin is warm and dry.  Psychiatric: He has a normal mood and affect. His behavior is normal. Judgment and thought content normal.    BP 119/69 mmHg  Pulse 72  Temp(Src) 97.2 F (36.2 C) (Oral)  Ht $R'5\' 10"'LB$  (1.778 m)  Wt 226 lb 9.6 oz (102.785 kg)  BMI 32.51 kg/m2      Assessment & Plan:  1. Hyperlipidemia  -  CMP14+EGFR - Lipid panel  2. BPH (benign prostatic hyperplasia)  - PSA, total and   Wardell Honour MD

## 2014-11-14 NOTE — Progress Notes (Signed)
   Subjective:    Patient ID: [name], male    DOB: 27-Dec-1946, 68 y.o.   MRN: [id number]  HPI       In process of dictating first entry today failed to document the fact that he was screened for cognitive loss. Memory appears to be intact but recent and in the past. We used a depression screening tool he does not appear depressed per that to her is no mention of diabetes. But he he does have a history of coronary artery disease and bypass surgery. Regarding physical activity, he works full-time but it is not on any regular exercise program per history. He has had no recent falls nor is he at risk for falls and   Review of Systems     Objective:   Physical Exam        Assessment & Plan:

## 2014-11-15 ENCOUNTER — Telehealth: Payer: Self-pay | Admitting: Cardiology

## 2014-11-15 ENCOUNTER — Telehealth: Payer: Self-pay

## 2014-11-15 LAB — PSA, TOTAL AND FREE
PSA FREE PCT: 41.3 %
PSA FREE: 0.33 ng/mL
PSA: 0.8 ng/mL (ref 0.0–4.0)

## 2014-11-15 LAB — CMP14+EGFR
A/G RATIO: 1.8 (ref 1.1–2.5)
ALK PHOS: 44 IU/L (ref 39–117)
ALT: 16 IU/L (ref 0–44)
AST: 16 IU/L (ref 0–40)
Albumin: 4 g/dL (ref 3.6–4.8)
BILIRUBIN TOTAL: 0.6 mg/dL (ref 0.0–1.2)
BUN/Creatinine Ratio: 13 (ref 10–22)
BUN: 12 mg/dL (ref 8–27)
CHLORIDE: 99 mmol/L (ref 97–108)
CO2: 25 mmol/L (ref 18–29)
Calcium: 9 mg/dL (ref 8.6–10.2)
Creatinine, Ser: 0.92 mg/dL (ref 0.76–1.27)
GFR calc non Af Amer: 85 mL/min/{1.73_m2} (ref >59)
GFR, EST AFRICAN AMERICAN: 98 mL/min/{1.73_m2} (ref >59)
GLUCOSE: 77 mg/dL (ref 65–99)
Globulin, Total: 2.2 g/dL (ref 1.5–4.5)
POTASSIUM: 4.3 mmol/L (ref 3.5–5.2)
SODIUM: 137 mmol/L (ref 134–144)
Total Protein: 6.2 g/dL (ref 6.0–8.5)

## 2014-11-15 LAB — LIPID PANEL
CHOLESTEROL TOTAL: 133 mg/dL (ref 100–199)
Chol/HDL Ratio: 3 ratio units (ref 0.0–5.0)
HDL: 44 mg/dL (ref >39)
LDL Calculated: 67 mg/dL (ref 0–99)
Triglycerides: 108 mg/dL (ref 0–149)
VLDL Cholesterol Cal: 22 mg/dL (ref 5–40)

## 2014-11-15 NOTE — Telephone Encounter (Signed)
-----   Message from Wardell Honour, MD sent at 11/15/2014  7:50 AM EST ----- All labs look great there are no abnormalities in the metabolic panel lipid panel or PSA

## 2014-11-15 NOTE — Telephone Encounter (Signed)
Pt is calling in wanting to know what is results were from a test he had done on 11/17. Please call  Thanks

## 2014-11-15 NOTE — Telephone Encounter (Signed)
Pt. Called and informed about his labs

## 2014-11-15 NOTE — Telephone Encounter (Signed)
Results left on am as per DPR of 04/2013

## 2014-11-18 ENCOUNTER — Other Ambulatory Visit: Payer: Self-pay | Admitting: *Deleted

## 2014-11-18 DIAGNOSIS — I6523 Occlusion and stenosis of bilateral carotid arteries: Secondary | ICD-10-CM

## 2015-10-15 ENCOUNTER — Ambulatory Visit (INDEPENDENT_AMBULATORY_CARE_PROVIDER_SITE_OTHER): Payer: Medicare Other | Admitting: Cardiology

## 2015-10-15 ENCOUNTER — Encounter: Payer: Self-pay | Admitting: Cardiology

## 2015-10-15 VITALS — BP 133/74 | HR 64 | Ht 70.0 in | Wt 207.0 lb

## 2015-10-15 DIAGNOSIS — R0989 Other specified symptoms and signs involving the circulatory and respiratory systems: Secondary | ICD-10-CM

## 2015-10-15 DIAGNOSIS — I429 Cardiomyopathy, unspecified: Secondary | ICD-10-CM

## 2015-10-15 NOTE — Progress Notes (Signed)
HPI The patient presents for follow up of CAD.  He had CABG in July 2010 with a mildly reduced EF.  Since I last saw him he has done well. He has had no chest pain he had his acute MI.  He denies any chest pressure, neck or arm discomfort. He is not having any palpitations, presyncope or syncope. He denies any shortness of breath, PND or orthopnea.   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  With this he does not have any cardiovascular symptoms.      Allergies  Allergen Reactions  . Atorvastatin   . Crestor [Rosuvastatin Calcium]   . Iodinated Diagnostic Agents   . Niaspan [Niacin Er]     Current Outpatient Prescriptions  Medication Sig Dispense Refill  . aspirin 81 MG tablet Take 81 mg by mouth daily.      . clopidogrel (PLAVIX) 75 MG tablet TAKE ONE TABLET BY MOUTH ONCE DAILY 90 tablet 3  . metoprolol succinate (TOPROL-XL) 50 MG 24 hr tablet Take 1 tablet (50 mg total) by mouth daily. Take with or immediately following a meal. 90 tablet 3  . simvastatin (ZOCOR) 20 MG tablet Take 0.5 tablets (10 mg total) by mouth 2 (two) times daily. 90 tablet 3   No current facility-administered medications for this visit.    Past Medical History  Diagnosis Date  . CVA (cerebral vascular accident) ([place])   . Coronary artery disease     S/P NSTEMI and CABGS for LM and 3v CAD 06/2009  . Carotid artery occlusion     right carotid stenting in 2007  . Hyperlipidemia   . Obesity   . Acid reflux   . Chest pain     Past Surgical History  Procedure Laterality Date  . Coronary artery bypass graft    . Tonsillectomy      ROS:  As stated in the HPI and negative for all other systems.  PHYSICAL EXAM BP 133/74 mmHg  Pulse 64  Ht 5\' 10"  (1.778 m)  Wt 207 lb (93.895 kg)  BMI 29.70 kg/m2  SpO2 99% GENERAL:  Well appearing HEENT:  Pupils equal round and reactive, fundi not visualized, oral mucosa unremarkable, dentures NECK:  No jugular venous distention, waveform within normal limits,  carotid upstroke brisk and symmetric, no bruits, no thyromegaly LUNGS:  Clear to auscultation bilaterally BACK:  No CVA tenderness CHEST:  Well healed sternotomy scar. HEART:  PMI not displaced or sustained,S1 and S2 within normal limits, no S3, no S4, no clicks, no rubs, no murmurs ABD:  Flat, positive bowel sounds normal in frequency in pitch, positive bruits, no rebound, no guarding, no midline pulsatile mass, no hepatomegaly, no splenomegaly EXT:  2 plus pulses upper, decreased DP/PT bilateral, mild left greater than right edema, no cyanosis no clubbing  EKG:  Sinus rhythm, rate 65, axis within normal limits, intervals within normal limits, old anteroseptal MI.  10/15/2015   ASSESSMENT AND PLAN  CAD, AUTOLOGOUS BYPASS GRAFT -  It has been six years since bypass.    I would like to schedule him for a POET (Plain Old Exercise Treadmill).  He can stop his Plavix.  HYPERLIPIDEMIA-MIXED -  He is due to have his lipids checked soon with Wardell Honour, MD.  The goal will be an LDL less than 70. He has a difficult time tolerating statins however.   CVA -  He had less than 39% stenosis bilaterally.  This was last checked in 2018.  OBESITY -  We have had long discussions about diet.   Cardiomyopathy -  He has a mildly reduced ejection fraction on echo in 2010. However, he has no symptoms.  No further imaging is indicated.   BRUIT - He will get an ultrasound to rule out AAA.

## 2015-10-15 NOTE — Patient Instructions (Addendum)
Medication Instructions:  Please stop your Plavix. Continue all other medications as listed.  Testing/Procedures: Both of the test scheduled will be completed at the [facility] office,  [phone]  Your physician has requested that you have an exercise tolerance test. For further information please visit [email or web address]. Please also follow instruction sheet, as given.  Your physician has requested that you have an abdominal aorta duplex. During this test, an ultrasound is used to evaluate the aorta. Allow 30 minutes for this exam. Do not eat after midnight the day before and avoid carbonated beverages  Follow-Up: Follow up in 1 year with Dr. Percival Spanish.  You will receive a letter in the mail 2 months before you are due.  Please call us when you receive this letter to schedule your follow up appointment.  Thank you for choosing [facility]!!

## 2015-10-31 ENCOUNTER — Inpatient Hospital Stay (HOSPITAL_COMMUNITY): Admission: RE | Admit: 2015-10-31 | Payer: Medicare Other | Source: Ambulatory Visit

## 2015-10-31 ENCOUNTER — Encounter (HOSPITAL_COMMUNITY): Payer: Medicare Other

## 2015-11-06 ENCOUNTER — Telehealth: Payer: Self-pay | Admitting: *Deleted

## 2015-11-06 NOTE — Telephone Encounter (Signed)
Patient due for annual physical after 11/15/15. Last was with Dr Sabra Heck. Left message on home phone for patient to call back and schedule this.

## 2015-11-19 ENCOUNTER — Telehealth (HOSPITAL_COMMUNITY): Payer: Self-pay

## 2015-11-19 NOTE — Telephone Encounter (Signed)
Encounter complete. 

## 2015-11-26 ENCOUNTER — Ambulatory Visit (HOSPITAL_COMMUNITY)
Admission: RE | Admit: 2015-11-26 | Discharge: 2015-11-26 | Disposition: A | Discharge status: Home / Self Care | Payer: Medicare Other | Source: Ambulatory Visit | Attending: Internal Medicine | Admitting: Internal Medicine

## 2015-11-26 ENCOUNTER — Inpatient Hospital Stay (HOSPITAL_COMMUNITY): Admission: RE | Admit: 2015-11-26 | Payer: Medicare Other | Source: Ambulatory Visit

## 2015-11-26 DIAGNOSIS — R0989 Other specified symptoms and signs involving the circulatory and respiratory systems: Secondary | ICD-10-CM | POA: Diagnosis not present

## 2015-11-26 DIAGNOSIS — I700 Atherosclerosis of aorta: Secondary | ICD-10-CM | POA: Insufficient documentation

## 2015-11-26 DIAGNOSIS — E785 Hyperlipidemia, unspecified: Secondary | ICD-10-CM | POA: Insufficient documentation

## 2015-11-26 DIAGNOSIS — I429 Cardiomyopathy, unspecified: Secondary | ICD-10-CM | POA: Insufficient documentation

## 2015-11-26 DIAGNOSIS — I771 Stricture of artery: Secondary | ICD-10-CM | POA: Diagnosis not present

## 2015-12-05 NOTE — Telephone Encounter (Signed)
SPOKE TO PT AND SCHEDULED APT

## 2015-12-17 ENCOUNTER — Ambulatory Visit (INDEPENDENT_AMBULATORY_CARE_PROVIDER_SITE_OTHER): Payer: Medicare Other | Admitting: Family Medicine

## 2015-12-17 ENCOUNTER — Encounter: Payer: Self-pay | Admitting: Family Medicine

## 2015-12-17 VITALS — BP 118/66 | HR 59 | Temp 97.6°F | Ht 70.0 in | Wt 231.0 lb

## 2015-12-17 DIAGNOSIS — I429 Cardiomyopathy, unspecified: Secondary | ICD-10-CM | POA: Diagnosis not present

## 2015-12-17 DIAGNOSIS — E785 Hyperlipidemia, unspecified: Secondary | ICD-10-CM

## 2015-12-17 DIAGNOSIS — N400 Enlarged prostate without lower urinary tract symptoms: Secondary | ICD-10-CM | POA: Diagnosis not present

## 2015-12-17 DIAGNOSIS — Z23 Encounter for immunization: Secondary | ICD-10-CM

## 2015-12-17 MED ORDER — CITALOPRAM HYDROBROMIDE 40 MG PO TABS: 40.0000 mg | ORAL_TABLET | Freq: Every day | ORAL | Status: DC

## 2015-12-17 NOTE — Addendum Note (Signed)
Addended by: Marylin Crosby on: 12/17/2015 09:28 AM   Modules accepted: Orders

## 2015-12-17 NOTE — Progress Notes (Signed)
   Subjective:    Patient ID: [name], male    DOB: 1946/09/21, 69 y.o.   MRN: [id number]  HPI 69 year old who is here for a physical. Denies current problems. He he does see cardiologist who assessed him for abdominal aneurysm at last visit. He was told he has poor circulation in lower extremities but he has had no symptoms. Lipids were last checked 1 year ago and were at goal. Continues on simvastatin 20 mg per day. There is some concern about weight gain this was discussion he was advised(to watch diet more closely.    Review of Systems  Constitutional: Negative.   Respiratory: Negative.   Cardiovascular: Negative.  Negative for leg swelling.  Genitourinary: Negative.   Neurological: Negative.       BP 118/66 mmHg  Pulse 59  Temp(Src) 97.6 F (36.4 C) (Oral)  Ht 5\' 10"  (1.778 m)  Wt 231 lb (104.781 kg)  BMI 33.15 kg/m2  Objective:   Physical Exam  Constitutional: He is oriented to person, place, and time.  Neurological: He is alert and oriented to person, place, and time.          Assessment & Plan:  1. Cardiomyopathy [facility]) All of by cardiology. Problem seems stable per history  2. BPH (benign prostatic hyperplasia) Patient denies symptoms such as nocturia. On no medicines for this problem  3. Hyperlipidemia 8 simvastatin 20 mg well. Was at goal year ago we will repeat today Wardell Honour MD

## 2016-02-21 ENCOUNTER — Other Ambulatory Visit: Payer: Self-pay | Admitting: Cardiology

## 2016-02-23 NOTE — Telephone Encounter (Signed)
Rx(s) sent to pharmacy electronically.  

## 2016-02-28 ENCOUNTER — Other Ambulatory Visit: Payer: Self-pay | Admitting: Cardiology

## 2016-03-01 NOTE — Telephone Encounter (Signed)
Rx(s) sent to pharmacy electronically.  

## 2016-03-03 ENCOUNTER — Other Ambulatory Visit: Payer: Self-pay | Admitting: Cardiology

## 2016-03-03 NOTE — Telephone Encounter (Signed)
REFILL 

## 2016-03-08 ENCOUNTER — Ambulatory Visit (INDEPENDENT_AMBULATORY_CARE_PROVIDER_SITE_OTHER): Payer: Medicare Other | Admitting: Cardiology

## 2016-03-08 ENCOUNTER — Encounter: Payer: Self-pay | Admitting: Cardiology

## 2016-03-08 VITALS — BP 124/73 | HR 64 | Ht 70.0 in | Wt 232.0 lb

## 2016-03-08 DIAGNOSIS — I429 Cardiomyopathy, unspecified: Secondary | ICD-10-CM | POA: Diagnosis not present

## 2016-03-08 DIAGNOSIS — I2510 Atherosclerotic heart disease of native coronary artery without angina pectoris: Secondary | ICD-10-CM

## 2016-03-08 DIAGNOSIS — R072 Precordial pain: Secondary | ICD-10-CM | POA: Diagnosis not present

## 2016-03-08 MED ORDER — NITROGLYCERIN 0.4 MG SL SUBL: 0.4000 mg | SUBLINGUAL_TABLET | SUBLINGUAL | Status: DC | PRN

## 2016-03-08 NOTE — Progress Notes (Signed)
Patient ID: [name], male   DOB: 05-02-1946, 70 y.o.   MRN: [id number]     Clinical Summary Mr. Vargo is a 70 y.o.male regular patient of Dr Percival Spanish, he is an add on patient to my schedule for recent episodes of chest pain.  1. CAD/Chest pain - CABG July 2010, from Dr [facility] notes the patient has done well since that time - from last clinic note he was to have a GXT for risk stratificaiton however does not appear that was done.   - chest pain recently that started on Thursday. Sharp pain left sided, 5/10. Occurred while he was lifting/hauling wood. No other associated symptoms. Not positional. Better with tylenol. Lasted just a few minutes. 3 total episodes within 10 minutes. No recurrent symptoms. Denies any SOB or DOE over the last few weeks. No relation to food. Still with constant mild ache at that site ongoing for several days.       Past Medical History  Diagnosis Date  . CVA (cerebral vascular accident) ([place])   . Coronary artery disease     S/P NSTEMI and CABGS for LM and 3v CAD 06/2009  . Carotid artery occlusion     right carotid stenting in 2007  . Hyperlipidemia   . Obesity   . Acid reflux   . Chest pain      Allergies  Allergen Reactions  . Atorvastatin Other (See Comments)    Muscle aches  . Crestor [Rosuvastatin Calcium] Other (See Comments)    Muscle aches  . Niaspan [Niacin Er] Other (See Comments)    Muscle aches  . Iodinated Diagnostic Agents Rash    rash     Current Outpatient Prescriptions  Medication Sig Dispense Refill  . aspirin 81 MG tablet Take 81 mg by mouth daily.      . citalopram (CELEXA) 40 MG tablet Take 1 tablet (40 mg total) by mouth daily. 90 tablet 1  . metoprolol succinate (TOPROL-XL) 50 MG 24 hr tablet TAKE ONE TABLET BY MOUTH ONCE DAILY. TAKE WITH OR IMMEDIATELY FOLLOWING A MEAL 90 tablet 3  . Misc Natural Products (COLON CLEANSE PO) Take 1 capsule by mouth daily.    Marland Kitchen OVER THE COUNTER MEDICATION Take 1 capsule by mouth  daily. VITAL RED    . simvastatin (ZOCOR) 20 MG tablet TAKE ONE-HALF TABLET BY MOUTH TWICE DAILY 90 tablet 3   No current facility-administered medications for this visit.     Past Surgical History  Procedure Laterality Date  . Coronary artery bypass graft    . Tonsillectomy       Allergies  Allergen Reactions  . Atorvastatin Other (See Comments)    Muscle aches  . Crestor [Rosuvastatin Calcium] Other (See Comments)    Muscle aches  . Niaspan [Niacin Er] Other (See Comments)    Muscle aches  . Iodinated Diagnostic Agents Rash    rash      Family History  Problem Relation Age of Onset  . Heart attack Father   . Heart attack Brother      Social History Mr. Shoff reports that he quit smoking about 10 years ago. His smoking use included Cigarettes. He started smoking about 51 years ago. He has a 61.5 pack-year smoking history. He has never used smokeless tobacco. Mr. Lavy reports that he does not drink alcohol.   Review of Systems CONSTITUTIONAL: No weight loss, fever, chills, weakness or fatigue.  HEENT: Eyes: No visual loss, blurred vision, double vision or yellow sclerae.No  hearing loss, sneezing, congestion, runny nose or sore throat.  SKIN: No rash or itching.  CARDIOVASCULAR: per HPI RESPIRATORY: No shortness of breath, cough or sputum.  GASTROINTESTINAL: No anorexia, nausea, vomiting or diarrhea. No abdominal pain or blood.  GENITOURINARY: No burning on urination, no polyuria NEUROLOGICAL: No headache, dizziness, syncope, paralysis, ataxia, numbness or tingling in the extremities. No change in bowel or bladder control.  MUSCULOSKELETAL: No muscle, back pain, joint pain or stiffness.  LYMPHATICS: No enlarged nodes. No history of splenectomy.  PSYCHIATRIC: No history of depression or anxiety.  ENDOCRINOLOGIC: No reports of sweating, cold or heat intolerance. No polyuria or polydipsia.  Marland Kitchen   Physical Examination Filed Vitals:   03/08/16 1301  BP: 124/73    Pulse: 64   Filed Weights   03/08/16 1301  Weight: 232 lb (105.235 kg)    Gen: resting comfortably, no acute distress HEENT: no scleral icterus, pupils equal round and reactive, no palptable cervical adenopathy,  CV: RRR, no m/r/g, no jvd Resp: Clear to auscultation bilaterally GI: abdomen is soft, non-tender, non-distended, normal bowel sounds, no hepatosplenomegaly MSK: extremities are warm, no edema.  Skin: warm, no rash Neuro:  no focal deficits Psych: appropriate affect   Diagnostic Studies 03/08/16 clinic EKG: NSR, no acute ischemic changes, old inferior Q waves    Assessment and Plan  1. CAD/Chest pain - atypical chest pain, likely MSK pain due to heavy lifting/hauling of wood. Pain better with prn tylenol, EKG in clinic without acute ischemic changes. No further cardiac testing at this time, he is to contact our office if pain progresses or reoccurs. F/u with Dr Percival Spanish in 6-8 weeks, given prn SL NG given his CAD history      Arnoldo Lenis, M.D.

## 2016-03-08 NOTE — Patient Instructions (Addendum)
   Begin Nitroglycerin as needed for severe chest pain - new sent to [facility] today.  Continue all other medications.   Follow up in  6- 8 weeks with Dr. Percival Spanish

## 2016-04-28 ENCOUNTER — Ambulatory Visit (INDEPENDENT_AMBULATORY_CARE_PROVIDER_SITE_OTHER): Payer: Medicare Other | Admitting: Cardiology

## 2016-04-28 ENCOUNTER — Encounter: Payer: Self-pay | Admitting: Cardiology

## 2016-04-28 VITALS — BP 120/80 | HR 60 | Ht 70.0 in | Wt 219.0 lb

## 2016-04-28 DIAGNOSIS — I2510 Atherosclerotic heart disease of native coronary artery without angina pectoris: Secondary | ICD-10-CM | POA: Diagnosis not present

## 2016-04-28 NOTE — Progress Notes (Signed)
HPI The patient presents for follow up of CAD.  He had CABG in July 2010 with a mildly reduced EF.  Since I last saw him he had some sharp chest pain and was seen by Dr. Harl Bowie.  However, this was thought to be musculoskeletal related to some trees he had been clearing. Marland Kitchen  He has otherwise done well. Since that event he has had no chest pain like he had with his acute MI.  He denies any chest pressure, neck or arm discomfort. He is not having any palpitations, presyncope or syncope. He denies any shortness of breath, PND or orthopnea.   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  With this he does not have any cardiovascular symptoms.      Allergies  Allergen Reactions  . Atorvastatin Other (See Comments)    Muscle aches  . Crestor [Rosuvastatin Calcium] Other (See Comments)    Muscle aches  . Niaspan [Niacin Er] Other (See Comments)    Muscle aches  . Iodinated Diagnostic Agents Rash    rash    Current Outpatient Prescriptions  Medication Sig Dispense Refill  . aspirin 81 MG tablet Take 81 mg by mouth daily.      . citalopram (CELEXA) 40 MG tablet Take 1 tablet (40 mg total) by mouth daily. 90 tablet 1  . metoprolol succinate (TOPROL-XL) 50 MG 24 hr tablet TAKE ONE TABLET BY MOUTH ONCE DAILY. TAKE WITH OR IMMEDIATELY FOLLOWING A MEAL 90 tablet 3  . Misc Natural Products (COLON CLEANSE PO) Take 1 capsule by mouth daily.    . nitroGLYCERIN (NITROSTAT) 0.4 MG SL tablet Place 1 tablet (0.4 mg total) under the tongue every 5 (five) minutes as needed for chest pain. 25 tablet 3  . OVER THE COUNTER MEDICATION Take 1 capsule by mouth daily. VITAL RED    . simvastatin (ZOCOR) 20 MG tablet 20 mg. Take 1/2 tablet by mouth once daily     No current facility-administered medications for this visit.    Past Medical History  Diagnosis Date  . CVA (cerebral vascular accident) ([place])   . Coronary artery disease     S/P NSTEMI and CABGS for LM and 3v CAD 06/2009  . Carotid artery occlusion    right carotid stenting in 2007  . Hyperlipidemia   . Obesity   . Acid reflux   . Chest pain     Past Surgical History  Procedure Laterality Date  . Coronary artery bypass graft    . Tonsillectomy      ROS:  As stated in the HPI and negative for all other systems.  PHYSICAL EXAM BP 120/80 mmHg  Pulse 60  Ht 5\' 10"  (1.778 m)  Wt 219 lb (99.338 kg)  BMI 31.42 kg/m2 GENERAL:  Well appearing HEENT:  Pupils equal round and reactive, fundi not visualized, oral mucosa unremarkable, dentures NECK:  No jugular venous distention, waveform within normal limits, carotid upstroke brisk and symmetric, no bruits, no thyromegaly LUNGS:  Clear to auscultation bilaterally BACK:  No CVA tenderness CHEST:  Well healed sternotomy scar. HEART:  PMI not displaced or sustained,S1 and S2 within normal limits, no S3, no S4, no clicks, no rubs, no murmurs ABD:  Flat, positive bowel sounds normal in frequency in pitch, positive bruits, no rebound, no guarding, no midline pulsatile mass, no hepatomegaly, no splenomegaly EXT:  2 plus pulses upper, decreased DP/PT bilateral, mild left greater than right edema, no cyanosis no clubbing    ASSESSMENT AND  PLAN  CAD, AUTOLOGOUS BYPASS GRAFT -  The patient has no new sypmtoms.  No further cardiovascular testing is indicated.  We will continue with aggressive risk reduction and meds as listed.  HYPERLIPIDEMIA-MIXED -  He is due to have his lipids checked soon with Wardell Honour, MD.  The goal will be an LDL less than 70. He has a difficult time tolerating statins however.   He is going to have this repeated when he sees his primary care next.   CVA -  He had less than 39% stenosis bilaterally.   No repeat testing is needed at this point.    OBESITY -  We have had long discussions about diet.   Cardiomyopathy -  He has a mildly reduced ejection fraction on echo in 2010. However, he has no symptoms.  No further imaging is indicated.

## 2016-04-28 NOTE — Patient Instructions (Signed)
Medication Instructions:  The current medical regimen is effective;  continue present plan and medications.  Follow-Up: Follow up in 1 year with Dr. Hochrein in [place].  You will receive a letter in the mail 2 months before you are due.  Please call us when you receive this letter to schedule your follow up appointment.  If you need a refill on your cardiac medications before your next appointment, please call your pharmacy.  Thank you for choosing [facility]!!     

## 2016-09-27 ENCOUNTER — Ambulatory Visit: Payer: Medicare Other

## 2016-11-01 ENCOUNTER — Encounter: Payer: Self-pay | Admitting: Pediatrics

## 2016-11-01 ENCOUNTER — Ambulatory Visit (INDEPENDENT_AMBULATORY_CARE_PROVIDER_SITE_OTHER): Payer: Medicare Other | Admitting: Pediatrics

## 2016-11-01 VITALS — BP 143/81 | HR 56 | Temp 96.9°F | Ht 70.0 in | Wt 228.6 lb

## 2016-11-01 DIAGNOSIS — B372 Candidiasis of skin and nail: Secondary | ICD-10-CM | POA: Diagnosis not present

## 2016-11-01 DIAGNOSIS — I10 Essential (primary) hypertension: Secondary | ICD-10-CM

## 2016-11-01 MED ORDER — LOSARTAN POTASSIUM 50 MG PO TABS
50.0000 mg | ORAL_TABLET | Freq: Every day | ORAL | 3 refills | Status: DC
Start: 2016-11-01 — End: 2016-12-06

## 2016-11-01 MED ORDER — NYSTATIN 100000 UNIT/GM EX OINT: 1.0000 "application " | TOPICAL_OINTMENT | Freq: Two times a day (BID) | CUTANEOUS | 0 refills | Status: DC

## 2016-11-01 NOTE — Progress Notes (Signed)
  Subjective:   Patient ID: [name], male    DOB: 1946/03/25, 70 y.o.   MRN: [id number] CC: Hypertension  HPI: [name] is a 70 y.o. male presenting for Hypertension  HTN:  Has had elevated readings at home starting 3 days ago Wasn't checking regulalry before then Felt bad the past few days, run down, light headed, dizzy at times. BP elevated to 150s during this time Now resolved Takes metoprolol regularly, not on any other BP meds H/o CVA No headaches, no chest pain Feeling well now  Occasionally has groin rash that improves with antifungal cream Slight rash now  Relevant past medical, surgical, family and social history reviewed. Allergies and medications reviewed and updated. History  Smoking Status  . Former Smoker  . Packs/day: 1.50  . Years: 41.00  . Types: Cigarettes  . Start date: 02/04/1965  . Quit date: 12/27/2005  Smokeless Tobacco  . Never Used   ROS: Per HPI   Objective:    BP (!) 143/81   Pulse (!) 56   Temp (!) 96.9 F (36.1 C) (Oral)   Ht 5' 10" (1.778 m)   Wt 228 lb 9.6 oz (103.7 kg)   BMI 32.80 kg/m   Wt Readings from Last 3 Encounters:  11/01/16 228 lb 9.6 oz (103.7 kg)  04/28/16 219 lb (99.3 kg)  03/08/16 232 lb (105.2 kg)    Gen: NAD, alert, cooperative with exam, NCAT EYES: EOMI, no conjunctival injection, or no icterus ENT:  TMs pearly gray b/l, OP without erythema LYMPH: no cervical LAD CV: NRRR, normal S1/S2, no murmur Resp: CTABL, no wheezes, normal WOB Abd: +BS, soft, NTND.  Ext: No edema, warm Neuro: Alert and oriented, strength equal b/l UE and LE, coordination grossly normal, CN III-XII intact   Assessment & Plan:  [name] was seen today for hypertension.  Diagnoses and all orders for this visit:  Essential hypertension Elevated blood pressure today Check at home Start below Check labs Needs repeat labs in 2-3 weeks -     BMP8+EGFR -     losartan (COZAAR) 50 MG tablet; Take 1 tablet (50 mg total) by mouth  daily.  Candidal skin infection -     nystatin ointment (MYCOSTATIN); Apply 1 application topically 2 (two) times daily as needed   Follow up plan: As scheduled Assunta Found, MD [place]

## 2016-11-02 LAB — BMP8+EGFR
BUN/Creatinine Ratio: 11 (ref 10–24)
BUN: 10 mg/dL (ref 8–27)
CALCIUM: 9.7 mg/dL (ref 8.6–10.2)
CO2: 25 mmol/L (ref 18–29)
CREATININE: 0.92 mg/dL (ref 0.76–1.27)
Chloride: 99 mmol/L (ref 96–106)
GFR calc Af Amer: 96 mL/min/{1.73_m2} (ref >59)
GFR, EST NON AFRICAN AMERICAN: 83 mL/min/{1.73_m2} (ref >59)
GLUCOSE: 79 mg/dL (ref 65–99)
Potassium: 4.7 mmol/L (ref 3.5–5.2)
SODIUM: 141 mmol/L (ref 134–144)

## 2016-11-05 ENCOUNTER — Ambulatory Visit: Payer: Medicare Other | Admitting: Family Medicine

## 2016-12-02 ENCOUNTER — Encounter: Payer: Self-pay | Admitting: Cardiology

## 2016-12-05 NOTE — Progress Notes (Signed)
HPI The patient presents for follow up of CAD.  He had CABG in July 2010 with a mildly reduced EF.  Since I last saw him he has had some increased blood pressure. He was given losartan by his primary provider office but he took this for 2 weeks and said he had no change in his blood pressure so we stopped. His blood pressure diary does demonstrate that he runs oftentimes in the systolics [id number]. He otherwise feels okay. He will get dizzy when his blood pressures in the 120s. He's not had presyncope or syncope. He has no chest pressure, neck or arm discomfort. He's not having any palpitations, presyncope or syncope. He has no PND or orthopnea.       Allergies  Allergen Reactions  . Atorvastatin Other (See Comments)    Muscle aches  . Crestor [Rosuvastatin Calcium] Other (See Comments)    Muscle aches  . Niaspan [Niacin Er] Other (See Comments)    Muscle aches  . Iodinated Diagnostic Agents Rash    rash    Current Outpatient Prescriptions  Medication Sig Dispense Refill  . aspirin 81 MG tablet Take 81 mg by mouth daily.      . citalopram (CELEXA) 40 MG tablet Take 1 tablet (40 mg total) by mouth daily. 90 tablet 1  . FLUZONE HIGH-DOSE 0.5 ML SUSY     . losartan (COZAAR) 50 MG tablet Take 1 tablet (50 mg total) by mouth daily. 30 tablet 3  . metoprolol succinate (TOPROL-XL) 50 MG 24 hr tablet TAKE ONE TABLET BY MOUTH ONCE DAILY. TAKE WITH OR IMMEDIATELY FOLLOWING A MEAL 90 tablet 3  . Misc Natural Products (COLON CLEANSE PO) Take 1 capsule by mouth daily.    . nitroGLYCERIN (NITROSTAT) 0.4 MG SL tablet Place 1 tablet (0.4 mg total) under the tongue every 5 (five) minutes as needed for chest pain. 25 tablet 3  . nystatin ointment (MYCOSTATIN) Apply 1 application topically 2 (two) times daily. 30 g 0  . OVER THE COUNTER MEDICATION Take 1 capsule by mouth daily. VITAL RED    . simvastatin (ZOCOR) 20 MG tablet 20 mg. Take 1/2 tablet by mouth once daily     No current facility-administered  medications for this visit.     Past Medical History:  Diagnosis Date  . Acid reflux   . Carotid artery occlusion    right carotid stenting in 2007  . Chest pain   . Coronary artery disease    S/P NSTEMI and CABGS for LM and 3v CAD 06/2009  . CVA (cerebral vascular accident) ([place])   . Hyperlipidemia   . Obesity     Past Surgical History:  Procedure Laterality Date  . CORONARY ARTERY BYPASS GRAFT    . TONSILLECTOMY      ROS:   As stated in the HPI and negative for all other systems.  PHYSICAL EXAM BP 128/74   Pulse 68   Ht 5\' 10"  (1.778 m)   Wt 224 lb (101.6 kg)   BMI 32.14 kg/m  GENERAL:  Well appearing HEENT:  Pupils equal round and reactive, fundi not visualized, oral mucosa unremarkable, dentures NECK:  No jugular venous distention, waveform within normal limits, carotid upstroke brisk and symmetric, no bruits, no thyromegaly LUNGS:  Clear to auscultation bilaterally BACK:  No CVA tenderness CHEST:  Well healed sternotomy scar. HEART:  PMI not displaced or sustained,S1 and S2 within normal limits, no S3, no S4, no clicks, no rubs, no murmurs ABD:  Flat, positive bowel sounds normal in frequency in pitch, positive bruits, no rebound, no guarding, no midline pulsatile mass, no hepatomegaly, no splenomegaly EXT:  2 plus pulses upper, decreased DP/PT bilateral, mild left greater than right edema, no cyanosis no clubbing  EKG:  Sinus rhythm, rate 66, axis within normal limits, intervals within normal limits, old anterior infarct, old anteroseptal infarct 03/08/16  ASSESSMENT AND PLAN  HTN - he is not at target with his blood pressure. I'm going to give him chlorthalidone 25 mg daily and he will keep a blood pressure diary.  CAD, AUTOLOGOUS BYPASS GRAFT -  The patient has no new sypmtoms.  No further cardiovascular testing is indicated.  We will continue with aggressive risk reduction and meds as listed.  HYPERLIPIDEMIA-MIXED -  This has not been checked for awhile.  I  will order a fasting lipid profile.  CVA -  He had less than 39% stenosis bilaterally.   No repeat testing is needed at this point.    OBESITY -  We have had long discussions about diet.   We discussed this again today.   Cardiomyopathy -  He has a mildly reduced ejection fraction on echo in 2010. However, he has no symptoms.  No further imaging is indicated.

## 2016-12-06 ENCOUNTER — Encounter: Payer: Self-pay | Admitting: Cardiology

## 2016-12-06 ENCOUNTER — Ambulatory Visit (INDEPENDENT_AMBULATORY_CARE_PROVIDER_SITE_OTHER): Payer: Medicare Other | Admitting: Cardiology

## 2016-12-06 VITALS — BP 128/74 | HR 68 | Ht 70.0 in | Wt 224.0 lb

## 2016-12-06 DIAGNOSIS — I10 Essential (primary) hypertension: Secondary | ICD-10-CM | POA: Diagnosis not present

## 2016-12-06 DIAGNOSIS — I2510 Atherosclerotic heart disease of native coronary artery without angina pectoris: Secondary | ICD-10-CM | POA: Diagnosis not present

## 2016-12-06 DIAGNOSIS — I255 Ischemic cardiomyopathy: Secondary | ICD-10-CM

## 2016-12-06 MED ORDER — CHLORTHALIDONE 25 MG PO TABS: 25.0000 mg | ORAL_TABLET | Freq: Every day | ORAL | 11 refills | Status: DC

## 2016-12-06 NOTE — Patient Instructions (Addendum)
Medication Instructions:  START- Chlorthalidone 25 mg daily  Labwork: None Ordered  Testing/Procedures: None ordered  Follow-Up: Your physician wants you to follow-up in: 6 Months in [place]. You will receive a reminder letter in the mail two months in advance. If you don't receive a letter, please call our office to schedule the follow-up appointment.   Any Other Special Instructions Will Be Listed Below (If Applicable).   If you need a refill on your cardiac medications before your next appointment, please call your pharmacy.

## 2016-12-08 ENCOUNTER — Ambulatory Visit: Payer: Medicare Other | Admitting: Cardiology

## 2016-12-08 ENCOUNTER — Encounter: Payer: Self-pay | Admitting: Cardiology

## 2016-12-17 ENCOUNTER — Encounter: Payer: Self-pay | Admitting: Family Medicine

## 2016-12-17 ENCOUNTER — Encounter: Payer: Medicare Other | Admitting: Family Medicine

## 2016-12-17 ENCOUNTER — Ambulatory Visit (INDEPENDENT_AMBULATORY_CARE_PROVIDER_SITE_OTHER): Payer: Medicare Other | Admitting: Family Medicine

## 2016-12-17 VITALS — BP 136/83 | HR 75 | Temp 96.7°F | Ht 70.0 in | Wt 220.2 lb

## 2016-12-17 DIAGNOSIS — E785 Hyperlipidemia, unspecified: Secondary | ICD-10-CM | POA: Diagnosis not present

## 2016-12-17 DIAGNOSIS — Z1211 Encounter for screening for malignant neoplasm of colon: Secondary | ICD-10-CM | POA: Diagnosis not present

## 2016-12-17 DIAGNOSIS — Z125 Encounter for screening for malignant neoplasm of prostate: Secondary | ICD-10-CM | POA: Diagnosis not present

## 2016-12-17 MED ORDER — CITALOPRAM HYDROBROMIDE 40 MG PO TABS: 40.0000 mg | ORAL_TABLET | Freq: Every day | ORAL | 1 refills | Status: DC

## 2016-12-17 NOTE — Progress Notes (Signed)
Subjective:    Patient ID: [name], male    DOB: 08-09-1946, 70 y.o.   MRN: [id number]  HPI Patient is here today for a follow up on hypertension and hyperlipidemia. Patient also needs a lipid panel today for Dr. Percival Spanish. Patient has no other complaints today. He was seen last month for [place] rash and given nystatin which has not helped. He was also started on losartan but he felt that was ineffective and has since stopped on his own. He does complain of some hearing loss and thinks maybe his ears need to be irrigated.  Review of Systems  Constitutional: Negative.   HENT: Negative.   Eyes: Negative.   Respiratory: Negative.   Cardiovascular: Negative.   Gastrointestinal: Negative.   Endocrine: Negative.   Genitourinary: Negative.   Musculoskeletal: Negative.   Skin: Negative.   Allergic/Immunologic: Negative.   Neurological: Negative.   Hematological: Negative.   Psychiatric/Behavioral: Negative.      Patient Active Problem List   Diagnosis Date Noted  . BPH (benign prostatic hyperplasia) 11/14/2014  . Cardiomyopathy ([place]) 10/06/2011  . SKIN RASH 09/22/2009  . CAD, AUTOLOGOUS BYPASS GRAFT 07/13/2009  . Hyperlipidemia 06/20/2009  . OBESITY 06/20/2009  . CVA 06/20/2009  . ACID REFLUX DISEASE 06/20/2009  . CHEST PAIN-UNSPECIFIED 06/20/2009   Outpatient Encounter Prescriptions as of 12/17/2016  Medication Sig  . aspirin 81 MG tablet Take 81 mg by mouth daily.    . chlorthalidone (HYGROTON) 25 MG tablet Take 1 tablet (25 mg total) by mouth daily.  . citalopram (CELEXA) 40 MG tablet Take 1 tablet (40 mg total) by mouth daily.  . metoprolol succinate (TOPROL-XL) 50 MG 24 hr tablet Take 25 mg by mouth daily. Take with or immediately following a meal.  . Misc Natural Products (COLON CLEANSE PO) Take 1 capsule by mouth daily.  . nitroGLYCERIN (NITROSTAT) 0.4 MG SL tablet Place 1 tablet (0.4 mg total) under the tongue every 5 (five) minutes as needed for chest pain.  Marland Kitchen  OVER THE COUNTER MEDICATION Take 1 capsule by mouth daily. VITAL RED  . simvastatin (ZOCOR) 20 MG tablet Take 20 mg by mouth daily at 6 PM.   . [DISCONTINUED] FLUZONE HIGH-DOSE 0.5 ML SUSY   . [DISCONTINUED] nystatin ointment (MYCOSTATIN) Apply 1 application topically 2 (two) times daily. (Patient not taking: Reported on 12/17/2016)   No facility-administered encounter medications on file as of 12/17/2016.        Objective:   Physical Exam  Constitutional: He is oriented to person, place, and time. He appears well-developed and well-nourished.  HENT:  Mouth/Throat: Oropharynx is clear and moist.  External ear canals are clear  Eyes: Pupils are equal, round, and reactive to light.  Cardiovascular: Normal rate, regular rhythm and normal heart sounds.   Pulmonary/Chest: Effort normal and breath sounds normal.  Abdominal: Soft. Bowel sounds are normal.  Genitourinary:  Genitourinary Comments: Erythema on the scrotum and inner thighs consistent with tinea cruris  Neurological: He is alert and oriented to person, place, and time.  Psychiatric: He has a normal mood and affect. His behavior is normal.   BP 136/83   Pulse 75   Temp (!) 96.7 F (35.9 C) (Oral)   Ht 5' 10" (1.778 m)   Wt 220 lb 3.2 oz (99.9 kg)   BMI 31.60 kg/m         Assessment & Plan:  1. Hyperlipidemia, unspecified hyperlipidemia type Lipids were last assessed 2 years ago and were at goal. -  CMP14+EGFR - Lipid panel  2. Special screening for malignant neoplasms, colon   3. Screening PSA (prostate specific antigen) Patient desires to have PSA/prostate screening after we discussed - PSA, total and free I did recommend Lamisil cream in the place of nystatin for his tinea infection. Also recommended Zeasorb-AF for moist control  Wardell Honour MD

## 2016-12-17 NOTE — Patient Instructions (Signed)
Continue current medications. Continue good therapeutic lifestyle changes which include good diet and exercise. Fall precautions discussed with patient. If an FOBT was given today- please return it to our front desk. If you are over 71 years old - you may need Prevnar 13 or the adult Pneumonia vaccine.  Flu Shots will be available at our office starting mid- September. Please call and schedule a FLU CLINIC APPOINTMENT.                        Medicare Annual Wellness Visit  [place] and the medical providers at [facility] strive to bring you the best medical care.  In doing so we not only want to address your current medical conditions and concerns but also to detect new conditions early and prevent illness, disease and health-related problems.    Medicare offers a yearly Wellness Visit which allows our clinical staff to assess your need for preventative services including immunizations, lifestyle education, counseling to decrease risk of preventable diseases and screening for fall risk and other medical concerns.    This visit is provided free of charge (no copay) for all Medicare recipients. The clinical pharmacists at [facility] have begun to conduct these Wellness Visits which will also include a thorough review of all your medications.    As you primary medical provider recommend that you make an appointment for your Annual Wellness Visit if you have not done so already this year.  You may set up this appointment before you leave today or you may call back ([phone]) and schedule an appointment.  Please make sure when you call that you mention that you are scheduling your Annual Wellness Visit with the clinical pharmacist so that the appointment may be made for the proper length of time.    

## 2016-12-18 LAB — LIPID PANEL
CHOLESTEROL TOTAL: 117 mg/dL (ref 100–199)
Chol/HDL Ratio: 3.1 ratio units (ref 0.0–5.0)
HDL: 38 mg/dL — ABNORMAL LOW (ref >39)
LDL CALC: 56 mg/dL (ref 0–99)
Triglycerides: 114 mg/dL (ref 0–149)
VLDL CHOLESTEROL CAL: 23 mg/dL (ref 5–40)

## 2016-12-18 LAB — PSA, TOTAL AND FREE
PSA, Free Pct: 42.5 %
PSA, Free: 0.34 ng/mL
Prostate Specific Ag, Serum: 0.8 ng/mL (ref 0.0–4.0)

## 2016-12-18 LAB — CMP14+EGFR
ALBUMIN: 4.6 g/dL (ref 3.5–4.8)
ALK PHOS: 51 IU/L (ref 39–117)
ALT: 24 IU/L (ref 0–44)
AST: 24 IU/L (ref 0–40)
Albumin/Globulin Ratio: 1.9 (ref 1.2–2.2)
BUN / CREAT RATIO: 20 (ref 10–24)
BUN: 17 mg/dL (ref 8–27)
Bilirubin Total: 0.6 mg/dL (ref 0.0–1.2)
CALCIUM: 9.8 mg/dL (ref 8.6–10.2)
CO2: 29 mmol/L (ref 18–29)
CREATININE: 0.86 mg/dL (ref 0.76–1.27)
Chloride: 100 mmol/L (ref 96–106)
GFR calc Af Amer: 101 mL/min/{1.73_m2} (ref >59)
GFR, EST NON AFRICAN AMERICAN: 87 mL/min/{1.73_m2} (ref >59)
GLUCOSE: 98 mg/dL (ref 65–99)
Globulin, Total: 2.4 g/dL (ref 1.5–4.5)
Potassium: 4.2 mmol/L (ref 3.5–5.2)
Sodium: 143 mmol/L (ref 134–144)
Total Protein: 7 g/dL (ref 6.0–8.5)

## 2016-12-22 ENCOUNTER — Telehealth: Payer: Self-pay | Admitting: Family Medicine

## 2016-12-22 NOTE — Telephone Encounter (Signed)
Patient aware of lab results.

## 2017-05-11 ENCOUNTER — Other Ambulatory Visit: Payer: Self-pay | Admitting: Cardiology

## 2017-06-15 NOTE — Progress Notes (Signed)
HPI The patient presents for follow up of CAD.  He had CABG in July 2010 with a mildly reduced EF.  Since I last saw him he has done well.  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  The patient denies any new symptoms such as chest discomfort, neck or arm discomfort. There has been no new shortness of breath, PND or orthopnea. There have been no reported palpitations, presyncope or syncope.  He does some walking 100 yards on a slight grade to his shop several times per day.  He does some sit ups.    Allergies  Allergen Reactions  . Atorvastatin Other (See Comments)    Muscle aches  . Crestor [Rosuvastatin Calcium] Other (See Comments)    Muscle aches  . Niaspan [Niacin Er] Other (See Comments)    Muscle aches  . Iodinated Diagnostic Agents Rash    rash    Current Outpatient Prescriptions  Medication Sig Dispense Refill  . aspirin 81 MG tablet Take 81 mg by mouth daily.      . chlorthalidone (HYGROTON) 25 MG tablet Take 1 tablet (25 mg total) by mouth daily. 30 tablet 11  . citalopram (CELEXA) 40 MG tablet Take 1 tablet (40 mg total) by mouth daily. 90 tablet 1  . metoprolol succinate (TOPROL-XL) 50 MG 24 hr tablet Take 25 mg by mouth daily. Take with or immediately following a meal.    . Misc Natural Products (COLON CLEANSE PO) Take 1 capsule by mouth daily.    . nitroGLYCERIN (NITROSTAT) 0.4 MG SL tablet Place 1 tablet (0.4 mg total) under the tongue every 5 (five) minutes as needed for chest pain. 25 tablet 3  . OVER THE COUNTER MEDICATION Take 1 capsule by mouth daily. VITAL RED    . simvastatin (ZOCOR) 20 MG tablet Take 10 mg by mouth daily at 6 PM.      No current facility-administered medications for this visit.     Past Medical History:  Diagnosis Date  . Acid reflux   . Carotid artery occlusion    right carotid stenting in 2007  . Chest pain   . Coronary artery disease    S/P NSTEMI and CABGS for LM and 3v CAD 06/2009  . CVA (cerebral vascular accident) ([place])    . Hyperlipidemia   . Obesity     Past Surgical History:  Procedure Laterality Date  . CORONARY ARTERY BYPASS GRAFT    . TONSILLECTOMY      ROS:   As stated in the HPI and negative for all other systems.    PHYSICAL EXAM BP 131/74   Pulse 69   Ht 5\' 10"  (1.778 m)   Wt 223 lb (101.2 kg)   BMI 32.00 kg/m   GENERAL:  Well appearing NECK:  No jugular venous distention, waveform within normal limits, carotid upstroke brisk and symmetric, no bruits, no thyromegaly LUNGS:  Clear to auscultation bilaterally BACK:  No CVA tenderness CHEST:  Well healed sternotomy scar. HEART:  PMI not displaced or sustained,S1 and S2 within normal limits, no S3, no S4, no clicks, no rubs, no murmurs ABD:  Flat, positive bowel sounds normal in frequency in pitch, no bruits, no rebound, no guarding, no midline pulsatile mass, no hepatomegaly, no splenomegaly EXT:  2 plus pulses throughout, no edema, no cyanosis no clubbing  EKG:  Sinus rhythm, rate 71 , axis within normal limits, intervals within normal limits, old anterior infarct, old anteroseptal infarct 03/08/16  Lab Results  Component Value Date   CHOL 117 12/17/2016   TRIG 114 12/17/2016   HDL 38 (L) 12/17/2016   LDLCALC 56 12/17/2016    ASSESSMENT AND PLAN  HTN -  The blood pressure is at target. No change in medications is indicated. We will continue with therapeutic lifestyle changes (TLC).  CAD, AUTOLOGOUS BYPASS GRAFT -  The patient has no new sypmtoms.  No further cardiovascular testing is indicated.  We will continue with aggressive risk reduction and meds as listed.  HYPERLIPIDEMIA-MIXED -  LDL was as above.  He will continue the meds as listed.   CVA -  He had less than 39% stenosis bilaterally.   This was in  2015.  No change in therapy or further imaging is planned.   OBESITY -  We have discussed at length diet and exercise.   Cardiomyopathy -  He has a mildly reduced ejection fraction on echo in 2010.  He has no  symptoms.  No change in therapy is planned.

## 2017-06-16 ENCOUNTER — Encounter: Payer: Self-pay | Admitting: Cardiology

## 2017-06-16 ENCOUNTER — Ambulatory Visit (INDEPENDENT_AMBULATORY_CARE_PROVIDER_SITE_OTHER): Payer: Medicare Other | Admitting: Cardiology

## 2017-06-16 VITALS — BP 131/74 | HR 69 | Ht 70.0 in | Wt 223.0 lb

## 2017-06-16 DIAGNOSIS — I428 Other cardiomyopathies: Secondary | ICD-10-CM | POA: Diagnosis not present

## 2017-06-16 DIAGNOSIS — I2510 Atherosclerotic heart disease of native coronary artery without angina pectoris: Secondary | ICD-10-CM

## 2017-06-16 DIAGNOSIS — I10 Essential (primary) hypertension: Secondary | ICD-10-CM

## 2017-06-16 DIAGNOSIS — E785 Hyperlipidemia, unspecified: Secondary | ICD-10-CM

## 2017-06-16 NOTE — Patient Instructions (Signed)

## 2017-08-15 DIAGNOSIS — L720 Epidermal cyst: Secondary | ICD-10-CM | POA: Diagnosis not present

## 2017-08-15 DIAGNOSIS — D225 Melanocytic nevi of trunk: Secondary | ICD-10-CM | POA: Diagnosis not present

## 2017-08-15 DIAGNOSIS — D044 Carcinoma in situ of skin of scalp and neck: Secondary | ICD-10-CM | POA: Diagnosis not present

## 2017-08-15 DIAGNOSIS — L570 Actinic keratosis: Secondary | ICD-10-CM | POA: Diagnosis not present

## 2017-08-15 DIAGNOSIS — L820 Inflamed seborrheic keratosis: Secondary | ICD-10-CM | POA: Diagnosis not present

## 2017-08-15 DIAGNOSIS — X32XXXA Exposure to sunlight, initial encounter: Secondary | ICD-10-CM | POA: Diagnosis not present

## 2017-10-17 ENCOUNTER — Ambulatory Visit: Payer: Medicare Other | Admitting: *Deleted

## 2017-10-17 DIAGNOSIS — D0461 Carcinoma in situ of skin of right upper limb, including shoulder: Secondary | ICD-10-CM | POA: Diagnosis not present

## 2017-10-17 DIAGNOSIS — L570 Actinic keratosis: Secondary | ICD-10-CM | POA: Diagnosis not present

## 2017-10-17 DIAGNOSIS — Z08 Encounter for follow-up examination after completed treatment for malignant neoplasm: Secondary | ICD-10-CM | POA: Diagnosis not present

## 2017-10-17 DIAGNOSIS — Z85828 Personal history of other malignant neoplasm of skin: Secondary | ICD-10-CM | POA: Diagnosis not present

## 2017-10-17 DIAGNOSIS — X32XXXD Exposure to sunlight, subsequent encounter: Secondary | ICD-10-CM | POA: Diagnosis not present

## 2017-10-31 ENCOUNTER — Ambulatory Visit: Payer: Medicare Other | Admitting: *Deleted

## 2017-12-14 ENCOUNTER — Other Ambulatory Visit: Payer: Self-pay | Admitting: Cardiology

## 2017-12-19 ENCOUNTER — Other Ambulatory Visit: Payer: Self-pay | Admitting: *Deleted

## 2017-12-19 MED ORDER — SIMVASTATIN 20 MG PO TABS: 10.0000 mg | ORAL_TABLET | Freq: Two times a day (BID) | ORAL | 1 refills | Status: DC

## 2017-12-19 NOTE — Telephone Encounter (Signed)
Rx has been sent to the pharmacy electronically. ° °

## 2017-12-22 ENCOUNTER — Telehealth: Payer: Self-pay | Admitting: Cardiology

## 2017-12-22 MED ORDER — SIMVASTATIN 20 MG PO TABS: 10.0000 mg | ORAL_TABLET | Freq: Every day | ORAL | 1 refills | Status: DC

## 2017-12-22 NOTE — Telephone Encounter (Signed)
New Message    Pt c/o medication issue:  1. Name of Medication: simvastatin 2. How are you currently taking this medication (dosage and times per day)? Once a day  3. Are you having a reaction (difficulty breathing--STAT)? no  4. What is your medication issue? Pharmacy says the rx is written 2 times a day but the patient is only taking once a day. Pharmacy wants clarity on how the patient should be taking the medication. Please call.

## 2017-12-22 NOTE — Telephone Encounter (Signed)
   I reviewed chart. Pt last seen in June 2018, taking 1/2 tab of simvastatin 20mg  (10mg  daily). No dose adjustments recommended by provider. Refill on 12/24 by our office was incorrectly increased likely d/t human and/or computer error.  I resubmitted refill authorization for corrected dose. Returned call, informed pharmacy of correction and they voiced their understanding.

## 2018-01-09 ENCOUNTER — Ambulatory Visit (INDEPENDENT_AMBULATORY_CARE_PROVIDER_SITE_OTHER): Payer: Medicare Other | Admitting: *Deleted

## 2018-01-09 DIAGNOSIS — Z23 Encounter for immunization: Secondary | ICD-10-CM | POA: Diagnosis not present

## 2018-02-25 ENCOUNTER — Other Ambulatory Visit: Payer: Self-pay | Admitting: Family Medicine

## 2018-02-28 ENCOUNTER — Other Ambulatory Visit: Payer: Self-pay | Admitting: Family Medicine

## 2018-02-28 NOTE — Progress Notes (Signed)
Subjective: CC: medication refills PCP: [name], DO YHC:[facility] [name] is a 72 y.o. male presenting to clinic today for:  1.  Depression Patient reports that he has been doing extremely well on Celexa.  He is actually only been taking half a tablet daily and notes good symptom control.  Denies depressive or anxious symptoms.  No heart palpitations.  Does sometimes have difficulty sleeping but this is been an issue for years.  He overall notes that he has a great life and is very content.  No SI or HI.  2.  CAD/hyperlipidemia Patient sees Dr. Percival Spanish regularly, who takes care of his metoprolol, nitroglycerin, chlorthalidone and Zocor.  Patient reports that he has not had labs done in a while and would like to get these done.  He reports compliance with medications.  Denies chest pain, shortness of breath.    ROS: Per HPI  Allergies  Allergen Reactions  . Atorvastatin Other (See Comments)    Muscle aches  . Crestor [Rosuvastatin Calcium] Other (See Comments)    Muscle aches  . Niaspan [Niacin Er] Other (See Comments)    Muscle aches  . Iodinated Diagnostic Agents Rash    rash   Past Medical History:  Diagnosis Date  . Acid reflux   . Carotid artery occlusion    right carotid stenting in 2007  . Chest pain   . Coronary artery disease    S/P NSTEMI and CABGS for LM and 3v CAD 06/2009  . CVA (cerebral vascular accident) ([place])   . Hyperlipidemia   . Obesity     Current Outpatient Medications:  .  aspirin 81 MG tablet, Take 81 mg by mouth daily.  , Disp: , Rfl:  .  chlorthalidone (HYGROTON) 25 MG tablet, TAKE ONE TABLET BY MOUTH ONCE DAILY, Disp: 30 tablet, Rfl: 11 .  citalopram (CELEXA) 40 MG tablet, Take 1 tablet (40 mg total) by mouth daily., Disp: 90 tablet, Rfl: 1 .  simvastatin (ZOCOR) 20 MG tablet, Take 0.5 tablets (10 mg total) by mouth daily at 6 PM., Disp: 45 tablet, Rfl: 1 .  nitroGLYCERIN (NITROSTAT) 0.4 MG SL tablet, Place 1 tablet (0.4 mg total) under  the tongue every 5 (five) minutes as needed for chest pain. (Patient not taking: Reported on 03/02/2018), Disp: 25 tablet, Rfl: 3 Social History   Socioeconomic History  . Marital status: Married    Spouse name: Not on file  . Number of children: Not on file  . Years of education: Not on file  . Highest education level: Not on file  Social Needs  . Financial resource strain: Not on file  . Food insecurity - worry: Not on file  . Food insecurity - inability: Not on file  . Transportation needs - medical: Not on file  . Transportation needs - non-medical: Not on file  Occupational History  . Not on file  Tobacco Use  . Smoking status: Former Smoker    Packs/day: 1.50    Years: 41.00    Pack years: 61.50    Types: Cigarettes    Start date: 02/04/1965    Last attempt to quit: 12/27/2005    Years since quitting: 12.1  . Smokeless tobacco: Never Used  Substance and Sexual Activity  . Alcohol use: No    Alcohol/week: 0.0 oz  . Drug use: No  . Sexual activity: Not on file  Other Topics Concern  . Not on file  Social History Narrative  . Not on file  Family History  Problem Relation Age of Onset  . Heart attack Father   . Heart attack Brother     Objective: Office vital signs reviewed. BP 131/79   Pulse 74   Temp (!) 97.3 F (36.3 C) (Oral)   Ht '5\' 10"'  (1.778 m)   Wt 225 lb 4 oz (102.2 kg)   BMI 32.32 kg/m   Physical Examination:  General: Awake, alert, well nourished, nontoxic, No acute distress Neck: no carotid bruits, no JVD Cardio: regular rate and rhythm, S1S2 heard, no murmurs appreciated Pulm: clear to auscultation bilaterally, no wheezes, rhonchi or rales; normal work of breathing on room air Psych: Mood stable, speech normal, affect appropriate, good eye contact  Depression screen [facility] 2/9 03/02/2018 12/17/2016 11/01/2016  Decreased Interest 0 0 0  Down, Depressed, Hopeless 0 0 0  PHQ - 2 Score 0 0 0    Assessment/ Plan: 72 y.o. male   1. Coronary  atherosclerosis of autologous vein bypass graft without angina Blood pressure well controlled.  Patient asymptomatic.  Continue to follow with his cardiologist regularly.  Will check basic labs including lipid panel.  No medication changes. - CMP14+EGFR - Lipid Panel  2. Hyperlipidemia, unspecified hyperlipidemia type - Lipid Panel  3. Benign prostatic hyperplasia, unspecified whether lower urinary tract symptoms present - PSA  4. Other depression Celexa 40 mg tablets refilled.  Because he has been taking half a tablet, these directions have been updated.  Follow-up as needed.  5. Encounter for hepatitis C screening test for low risk patient - Hepatitis C antibody   Orders Placed This Encounter  Procedures  . CMP14+EGFR  . Lipid Panel  . Hepatitis C antibody  . PSA   Meds ordered this encounter  Medications  . citalopram (CELEXA) 40 MG tablet    Sig: Take 0.5 tablets (20 mg total) by mouth daily.    Dispense:  45 tablet    Refill:  [place], [place] [phone]

## 2018-02-28 NOTE — Telephone Encounter (Signed)
Pt NTBS for refill appt made 03/02/18 with Dr. Lajuana Ripple

## 2018-03-02 ENCOUNTER — Ambulatory Visit (INDEPENDENT_AMBULATORY_CARE_PROVIDER_SITE_OTHER): Payer: Medicare Other | Admitting: Family Medicine

## 2018-03-02 ENCOUNTER — Encounter: Payer: Self-pay | Admitting: Family Medicine

## 2018-03-02 VITALS — BP 131/79 | HR 74 | Temp 97.3°F | Ht 70.0 in | Wt 225.2 lb

## 2018-03-02 DIAGNOSIS — I25810 Atherosclerosis of coronary artery bypass graft(s) without angina pectoris: Secondary | ICD-10-CM | POA: Diagnosis not present

## 2018-03-02 DIAGNOSIS — N400 Enlarged prostate without lower urinary tract symptoms: Secondary | ICD-10-CM | POA: Diagnosis not present

## 2018-03-02 DIAGNOSIS — F3289 Other specified depressive episodes: Secondary | ICD-10-CM

## 2018-03-02 DIAGNOSIS — E785 Hyperlipidemia, unspecified: Secondary | ICD-10-CM | POA: Diagnosis not present

## 2018-03-02 DIAGNOSIS — F32A Depression, unspecified: Secondary | ICD-10-CM | POA: Insufficient documentation

## 2018-03-02 DIAGNOSIS — Z1159 Encounter for screening for other viral diseases: Secondary | ICD-10-CM

## 2018-03-02 DIAGNOSIS — F329 Major depressive disorder, single episode, unspecified: Secondary | ICD-10-CM | POA: Insufficient documentation

## 2018-03-02 MED ORDER — CITALOPRAM HYDROBROMIDE 40 MG PO TABS: 20.0000 mg | ORAL_TABLET | Freq: Every day | ORAL | 3 refills | Status: DC

## 2018-03-02 NOTE — Patient Instructions (Signed)
I have refilled your citalopram.  Plan to follow-up with me as needed.  I will contact you with the results of your labs once they are available.

## 2018-03-03 LAB — LIPID PANEL
CHOL/HDL RATIO: 4 ratio (ref 0.0–5.0)
Cholesterol, Total: 139 mg/dL (ref 100–199)
HDL: 35 mg/dL — AB (ref >39)
LDL Calculated: 65 mg/dL (ref 0–99)
Triglycerides: 197 mg/dL — ABNORMAL HIGH (ref 0–149)
VLDL Cholesterol Cal: 39 mg/dL (ref 5–40)

## 2018-03-03 LAB — CMP14+EGFR
A/G RATIO: 1.8 (ref 1.2–2.2)
ALT: 13 IU/L (ref 0–44)
AST: 19 IU/L (ref 0–40)
Albumin: 4.2 g/dL (ref 3.5–4.8)
Alkaline Phosphatase: 43 IU/L (ref 39–117)
BILIRUBIN TOTAL: 0.4 mg/dL (ref 0.0–1.2)
BUN/Creatinine Ratio: 22 (ref 10–24)
BUN: 16 mg/dL (ref 8–27)
CALCIUM: 9.2 mg/dL (ref 8.6–10.2)
CHLORIDE: 100 mmol/L (ref 96–106)
CO2: 28 mmol/L (ref 20–29)
Creatinine, Ser: 0.72 mg/dL — ABNORMAL LOW (ref 0.76–1.27)
GFR calc Af Amer: 107 mL/min/{1.73_m2} (ref >59)
GFR, EST NON AFRICAN AMERICAN: 93 mL/min/{1.73_m2} (ref >59)
GLOBULIN, TOTAL: 2.3 g/dL (ref 1.5–4.5)
Glucose: 62 mg/dL — ABNORMAL LOW (ref 65–99)
POTASSIUM: 4 mmol/L (ref 3.5–5.2)
SODIUM: 141 mmol/L (ref 134–144)
Total Protein: 6.5 g/dL (ref 6.0–8.5)

## 2018-03-03 LAB — PSA: PROSTATE SPECIFIC AG, SERUM: 0.9 ng/mL (ref 0.0–4.0)

## 2018-03-03 LAB — HEPATITIS C ANTIBODY

## 2018-03-31 ENCOUNTER — Other Ambulatory Visit: Payer: Self-pay | Admitting: Cardiology

## 2018-03-31 DIAGNOSIS — H5203 Hypermetropia, bilateral: Secondary | ICD-10-CM | POA: Diagnosis not present

## 2018-05-02 ENCOUNTER — Ambulatory Visit (INDEPENDENT_AMBULATORY_CARE_PROVIDER_SITE_OTHER): Payer: Medicare Other | Admitting: *Deleted

## 2018-05-02 VITALS — BP 117/71 | HR 79 | Ht 70.0 in | Wt 221.6 lb

## 2018-05-02 DIAGNOSIS — Z0000 Encounter for general adult medical examination without abnormal findings: Secondary | ICD-10-CM | POA: Diagnosis not present

## 2018-05-02 NOTE — Patient Instructions (Signed)
[name] , Thank you for taking time to come for your Medicare Wellness Visit. I appreciate your ongoing commitment to your health goals. Please review the following plan we discussed and let me know if I can assist you in the future.   These are the goals we discussed: Goals    . Exercise 3x per week (30 min per time)     Try to exercise for 30 minutes 3 times weekly        This is a list of the screening recommended for you and due dates:  Health Maintenance  Topic Date Due  . Flu Shot  07/27/2018  . Colon Cancer Screening  01/21/2021  . Tetanus Vaccine  09/16/2021  .  Hepatitis C: One time screening is recommended by Center for Disease Control  (CDC) for  adults born from [birth year] through 1966.   Completed  . Pneumonia vaccines  Completed     Advance Directive Advance directives are legal documents that let you make choices ahead of time about your health care and medical treatment in case you become unable to communicate for yourself. Advance directives are a way for you to communicate your wishes to family, friends, and health care providers. This can help convey your decisions about end-of-life care if you become unable to communicate. Discussing and writing advance directives should happen over time rather than all at once. Advance directives can be changed depending on your situation and what you want, even after you have signed the advance directives. If you do not have an advance directive, some states assign family decision makers to act on your behalf based on how closely you are related to them. Each state has its own laws regarding advance directives. You may want to check with your health care provider, attorney, or state representative about the laws in your state. There are different types of advance directives, such as:  Medical power of attorney.  Living will.  Do not resuscitate (DNR) or do not attempt resuscitation (DNAR) order.  Health care proxy and medical  power of attorney A health care proxy, also called a health care agent, is a person who is appointed to make medical decisions for you in cases in which you are unable to make the decisions yourself. Generally, people choose someone they know well and trust to represent their preferences. Make sure to ask this person for an agreement to act as your proxy. A proxy may have to exercise judgment in the event of a medical decision for which your wishes are not known. A medical power of attorney is a legal document that names your health care proxy. Depending on the laws in your state, after the document is written, it may also need to be:  Signed.  Notarized.  Dated.  Copied.  Witnessed.  Incorporated into your medical record.  You may also want to appoint someone to manage your financial affairs in a situation in which you are unable to do so. This is called a durable power of attorney for finances. It is a separate legal document from the durable power of attorney for health care. You may choose the same person or someone different from your health care proxy to act as your agent in financial matters. If you do not appoint a proxy, or if there is a concern that the proxy is not acting in your best interests, a court-appointed guardian may be designated to act on your behalf. Living will A living will is a set  of instructions documenting your wishes about medical care when you cannot express them yourself. Health care providers should keep a copy of your living will in your medical record. You may want to give a copy to family members or friends. To alert caregivers in case of an emergency, you can place a card in your wallet to let them know that you have a living will and where they can find it. A living will is used if you become:  Terminally ill.  Incapacitated.  Unable to communicate or make decisions.  Items to consider in your living will include:  The use or non-use of  life-sustaining equipment, such as dialysis machines and breathing machines (ventilators).  A DNR or DNAR order, which is the instruction not to use cardiopulmonary resuscitation (CPR) if breathing or heartbeat stops.  The use or non-use of tube feeding.  Withholding of food and fluids.  Comfort (palliative) care when the goal becomes comfort rather than a cure.  Organ and tissue donation.  A living will does not give instructions for distributing your money and property if you should pass away. It is recommended that you seek the advice of a lawyer when writing a will. Decisions about taxes, beneficiaries, and asset distribution will be legally binding. This process can relieve your family and friends of any concerns surrounding disputes or questions that may come up about the distribution of your assets. DNR or DNAR A DNR or DNAR order is a request not to have CPR in the event that your heart stops beating or you stop breathing. If a DNR or DNAR order has not been made and shared, a health care provider will try to help any patient whose heart has stopped or who has stopped breathing. If you plan to have surgery, talk with your health care provider about how your DNR or DNAR order will be followed if problems occur. Summary  Advance directives are the legal documents that allow you to make choices ahead of time about your health care and medical treatment in case you become unable to communicate for yourself.  The process of discussing and writing advance directives should happen over time. You can change the advance directives, even after you have signed them.  Advance directives include DNR or DNAR orders, living wills, and designating an agent as your medical power of attorney. This information is not intended to replace advice given to you by your health care provider. Make sure you discuss any questions you have with your health care provider. Document Released: 03/21/2008 Document  Revised: 11/01/2016 Document Reviewed: 11/01/2016 Elsevier Interactive Patient Education  2017 [employer].

## 2018-05-02 NOTE — Progress Notes (Signed)
Subjective:   [name] is a 72 y.o. male who presents for an Initial Medicare Annual Wellness Visit.  [name] is retired from [employer] in [place], [place] of 10 years.  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  He lives at home with his wife [name] of 21 years.  They have 2 kids, 2 grandchildren and 2 great-grandchildren.  [name] eats three meals daily that consist of waffles, sandwiches, meats and vegetables.  He does not have a daily exercise routine.  He has also not had any hospitalizations or surgeries in the past year.  Over all Mr. Richter feels that his health is the same as it was a year ago.      Objective:    Today's Vitals   05/02/18 0915  BP: 117/71  Pulse: 79  Weight: 221 lb 9.6 oz (100.5 kg)  Height: 5\' 10"  (1.778 m)   Body mass index is 31.8 kg/m.  No flowsheet data found.  Current Medications (verified) Outpatient Encounter Medications as of 05/02/2018  Medication Sig  . aspirin 81 MG tablet Take 81 mg by mouth daily.    . chlorthalidone (HYGROTON) 25 MG tablet TAKE ONE TABLET BY MOUTH ONCE DAILY  . citalopram (CELEXA) 40 MG tablet Take 0.5 tablets (20 mg total) by mouth daily.  . nitroGLYCERIN (NITROSTAT) 0.4 MG SL tablet Place 1 tablet (0.4 mg total) under the tongue every 5 (five) minutes as needed for chest pain.  . simvastatin (ZOCOR) 20 MG tablet TAKE 1/2 (ONE-HALF) TABLET BY MOUTH ONCE DAILY AT  6PM   No facility-administered encounter medications on file as of 05/02/2018.     Allergies (verified) Atorvastatin; Crestor [rosuvastatin calcium]; Niaspan [niacin er]; and Iodinated diagnostic agents   History: Past Medical History:  Diagnosis Date  . Acid reflux   . Carotid artery occlusion    right carotid stenting in 2007  . Chest pain   . Coronary artery disease    S/P NSTEMI and CABGS for LM and 3v CAD 06/2009  . CVA (cerebral vascular accident) ([place])   . Hyperlipidemia   . Obesity    Past  Surgical History:  Procedure Laterality Date  . CORONARY ARTERY BYPASS GRAFT    . TONSILLECTOMY     Family History  Problem Relation Age of Onset  . Heart attack Father   . Heart attack Brother    Social History   Socioeconomic History  . Marital status: Married    Spouse name: [name]  . Number of children: 2  . Years of education: Not on file  . Highest education level: Not on file  Occupational History  . Occupation: Retired  Scientific laboratory technician  . Financial resource strain: Not hard at all  . Food insecurity:    Worry: Never true    Inability: Never true  . Transportation needs:    Medical: No    Non-medical: No  Tobacco Use  . Smoking status: Former Smoker    Packs/day: 1.50    Years: 41.00    Pack years: 61.50    Types: Cigarettes    Start date: 02/04/1965    Last attempt to quit: 12/27/2005    Years since quitting: 12.3  . Smokeless tobacco: Never Used  Substance and Sexual Activity  . Alcohol use: No    Alcohol/week: 0.0 oz  . Drug use: No  . Sexual activity: Not Currently  Lifestyle  . Physical activity:    Days  per week: 0 days    Minutes per session: 0 min  . Stress: Not at all  Relationships  . Social connections:    Talks on phone: More than three times a week    Gets together: More than three times a week    Attends religious service: More than 4 times per year    Active member of club or organization: Yes    Attends meetings of clubs or organizations: More than 4 times per year    Relationship status: Married  Other Topics Concern  . Not on file  Social History Narrative  . Not on file   Tobacco Counseling No tobacco use at this time  Clinical Intake:  Pre-visit preparation completed: Yes  Pain : No/denies pain     Nutritional Status: BMI > 30  Obese Nutritional Risks: None Diabetes: No  How often do you need to have someone help you when you read instructions, pamphlets, or other written materials from your doctor or pharmacy?: 1 -  Never  Interpreter Needed?: No     Activities of Daily Living In your present state of health, do you have any difficulty performing the following activities: 05/02/2018  Hearing? Y  Vision? N  Difficulty concentrating or making decisions? Y  Walking or climbing stairs? N  Dressing or bathing? N  Doing errands, shopping? N  Some recent data might be hidden  Hearing loss noted. Wears hearing aid in left ear.  Some trouble concentrating   Immunizations and Health Maintenance Immunization History  Administered Date(s) Administered  . Influenza, High Dose Seasonal PF 01/09/2018  . Influenza,inj,Quad PF,6+ Mos 10/22/2013, 10/01/2014, 12/17/2015  . Pneumococcal Conjugate-13 10/22/2013  . Pneumococcal Polysaccharide-23 11/03/2010  . Tdap 09/17/2011  Shingrix information given.  Declines at this time There are no preventive care reminders to display for this patient.  Patient Care Team: [name], DO as PCP - General (Family Medicine) Minus Breeding, MD as Consulting Physician (Cardiology)  Indicate any recent Medical Services you may have received from other than [facility] providers in the past year (date may be approximate).    Assessment:   This is a routine wellness examination for [name].  Hearing/Vision screen Hearing aid in left ear Dietary issues and exercise activities discussed: Current Exercise Habits: The patient has a physically strenous job, but has no regular exercise apart from work., Exercise limited by: None identified  Goals    . Exercise 3x per week (30 min per time)     Try to exercise for 30 minutes 3 times weekly       Depression Screen PHQ 2/9 Scores 05/02/2018 03/02/2018 12/17/2016 11/01/2016  PHQ - 2 Score 0 0 0 0    Fall Risk Fall Risk  05/02/2018 03/02/2018 12/17/2016 11/01/2016 12/17/2015  Falls in the past year? No No No No No    Is the patient's home free of loose throw rugs in walkways, pet beds, electrical cords, etc?   yes      Grab bars  in the bathroom? yes      Handrails on the stairs?   yes      Adequate lighting?   yes  Timed Get Up and Go performed: Cognitive Function: MMSE - Mini Mental State Exam 05/02/2018  Orientation to time 5  Orientation to Place 5  Registration 3  Attention/ Calculation 5  Recall 3  Language- name 2 objects 2  Language- repeat 1  Language- follow 3 step command 3  Language- read & follow  direction 1  Write a sentence 1  Copy design 1  Total score 30  No memory loss noted at this time      Screening Tests Health Maintenance  Topic Date Due  . INFLUENZA VACCINE  07/27/2018  . COLONOSCOPY  01/21/2021  . TETANUS/TDAP  09/16/2021  . Hepatitis C Screening  Completed  . PNA vac Low Risk Adult  Completed    Qualifies for Shingles Vaccine? Yes, information given, declines at this time  Cancer Screenings: Lung: Low Dose CT Chest recommended if Age 74-80 years, 30 pack-year currently smoking OR have quit w/in 15years. Patient does not qualify. Colorectal: Yes up to date  Additional Screenings:  Hepatitis C Screening:       Plan:   Encourages Mr. Schild to try to exercise for at least 30 minutes, 3 times weekly.  Encourage to eat 3 healthy meals daily that consist of lean proteins, fruits, and vegetables.  Explained the importance of shingles vaccine and information given on shingrix.  Explained the importance of Advance Directive, information also give. Encouraged to keep all follow up appointments as scheduled with PCP and cardiologist.   I have personally reviewed and noted the following in the patient's chart:   . Medical and social history . Use of alcohol, tobacco or illicit drugs  . Current medications and supplements . Functional ability and status . Nutritional status . Physical activity . Advanced directives . List of other physicians . Hospitalizations, surgeries, and ER visits in previous 12 months . Vitals . Screenings to include cognitive, depression, and  falls . Referrals and appointments  In addition, I have reviewed and discussed with patient certain preventive protocols, quality metrics, and best practice recommendations. A written personalized care plan for preventive services as well as general preventive health recommendations were provided to patient.     Wardell Heath, LPN   [id number]

## 2018-07-10 ENCOUNTER — Other Ambulatory Visit: Payer: Self-pay | Admitting: Family Medicine

## 2018-07-10 ENCOUNTER — Telehealth: Payer: Self-pay | Admitting: Family Medicine

## 2018-07-10 DIAGNOSIS — I25810 Atherosclerosis of coronary artery bypass graft(s) without angina pectoris: Secondary | ICD-10-CM

## 2018-07-10 DIAGNOSIS — R6889 Other general symptoms and signs: Secondary | ICD-10-CM

## 2018-07-10 NOTE — Telephone Encounter (Signed)
Will place referral to vein and vascular.  Sounds like he has PAD.

## 2018-07-10 NOTE — Progress Notes (Signed)
Recent "circulation testing" which I suspect to be ABIs were: right leg was 0.49 and left leg was 0.59  Given h/o CAD s/p graft, HLD referral to vein and vascular should be considered for what sounds to be Moderate PAD.  My question is, is the patient symptomatic?  Does he have pain in his legs with walking?  Does he have any LE discoloration?  He is already on medical therapy for CAD w/ ASA, Statin, etc.  Attempted to call but no answer.  Left VM to call back.  Please have him let me know if he is symptomatic so that I may provide the specialist with more information.  Ashly M. Lajuana Ripple, [facility]

## 2018-07-10 NOTE — Telephone Encounter (Signed)
[employer] NP called stated she did a house call visit and did circulation test right leg was 0.49 and left leg was 0.59

## 2018-07-31 ENCOUNTER — Ambulatory Visit (INDEPENDENT_AMBULATORY_CARE_PROVIDER_SITE_OTHER): Payer: Medicare Other | Admitting: Family Medicine

## 2018-07-31 ENCOUNTER — Ambulatory Visit (INDEPENDENT_AMBULATORY_CARE_PROVIDER_SITE_OTHER): Payer: Medicare Other

## 2018-07-31 ENCOUNTER — Encounter: Payer: Self-pay | Admitting: Family Medicine

## 2018-07-31 VITALS — BP 139/81 | HR 77 | Temp 97.5°F | Ht 70.0 in | Wt 224.0 lb

## 2018-07-31 DIAGNOSIS — R6889 Other general symptoms and signs: Secondary | ICD-10-CM | POA: Diagnosis not present

## 2018-07-31 DIAGNOSIS — Z8679 Personal history of other diseases of the circulatory system: Secondary | ICD-10-CM | POA: Diagnosis not present

## 2018-07-31 DIAGNOSIS — Z8673 Personal history of transient ischemic attack (TIA), and cerebral infarction without residual deficits: Secondary | ICD-10-CM

## 2018-07-31 DIAGNOSIS — M79672 Pain in left foot: Secondary | ICD-10-CM

## 2018-07-31 NOTE — Patient Instructions (Signed)
I have ordered ultrasounds of your legs and neck to evaluate your vasculature.  You have a referral in place for vascular specialist.   Atherosclerosis Atherosclerosis is narrowing and hardening of the blood vessels (arteries). Arteries are tubes that carry blood that contains oxygen from the heart to all parts of the body. Arteries can become narrow or clogged with a buildup of fat, cholesterol, calcium, or other substances (plaque). Plaque decreases the amount of blood that can flow through the artery. Atherosclerosis can affect any artery in the body, including:  Heart arteries (coronary artery disease), which may cause heart attack.  Brain arteries, which may cause stroke.  Leg, arm, and pelvis arteries (peripheral artery disease), which may cause pain and numbness.  Kidney arteries, which may cause kidney (renal) failure.  Treatment may slow the disease and prevent further damage to the heart, brain, peripheral arteries, and kidneys. What are the causes? Atherosclerosis develops when plaque forms in an artery. This damages the inside wall of the artery. Over time, the plaque grows and hardens. It may break through the artery wall. This causes a blood clot to form over the break, which narrows the artery more. The clot may also break loose and travel to other arteries, causing more damage. What increases the risk? This condition is more likely to develop in people who:  Are middle age or older.  Have a family history of atherosclerosis.  Have high cholesterol.  Have high blood fats (triglycerides).  Have diabetes.  Are overweight.  Smoke tobacco.  Do not exercise enough.  Have a substance in the blood that indicates increased levels of inflammation in the body (C-reactive protein, or CRP).  Have sleep apnea.  Are stressed.  Drink too much alcohol.  What are the signs or symptoms? This condition may not cause any symptoms. If you do have symptoms, they are caused by  damage to an area of your body that is not getting enough blood. The following symptoms are possible:  Coronary artery disease may cause chest pain and shortness of breath.  Decreased blood supply to your brain may cause a stroke. Signs and symptoms of stroke may include sudden: ? Weakness on one side of the body. ? Confusion. ? Changes in vision. ? Inability to speak or understand speech. ? Loss of balance, coordination, or ability to walk. ? Severe headache. ? Loss of consciousness.  Peripheral artery disease may cause pain and numbness, often in the legs and hips.  Renal failure may cause fatigue, nausea, swelling, and itchy skin.  How is this diagnosed? This condition is diagnosed based on your medical history and a physical exam. During the exam, your health care provider will check your pulses and listen for a "whooshing" sound over your arteries (bruit). You may have tests, such as:  Blood tests to check your levels of cholesterol, triglycerides, and CRP.  Electrocardiogram (ECG) to check for heart damage.  Chest X-ray to see if your heart is enlarged, which is a sign of heart failure.  Stress test to see how your heart reacts to exercise.  Echocardiogram to get images of the inside of your heart.  Ankle-Brachial index to compare blood pressure in your arms to blood pressure in your ankles.  Ultrasound of your peripheral arteries to check blood flow.  CT scan to check for damage to your heart or brain.  X-rays of blood vessels after dye has been injected (angiogram) to check blood flow.  How is this treated? Treatment starts with lifestyle  changes, which may include:  Changing your diet.  Losing weight.  Reducing stress.  Exercising and being more physically active.  Not smoking.  You also may need medicine to:  Lower triglycerides and cholesterol.  Lower and control blood pressure.  Prevent blood clots.  Lower inflammation in your body.  Lower and  control your blood sugar.  Sometimes, surgery is needed to remove plaque, widen your artery, or create a new path for your blood (bypass). Surgical treatment may include:  Removing plaque from an artery (endarterectomy).  Opening a narrowed heart artery (angioplasty).  Heart or peripheral artery bypass graft surgery.  Follow these instructions at home: Lifestyle   Eat a heart-healthy diet. Talk to your health care provider or a diet specialist (dietitian) if you need help. A heart-healthy diet includes: ? Limiting unhealthy fats and increasing healthy fats. Some examples of healthy fats are olive oil and canola oil. ? Eating plant-based foods, such as fruits, vegetables, nuts, legumes, and whole grains.  Follow an exercise program as told by your health care provider.  Maintain a healthy weight. Lose weight if directed by your health care provider.  Rest when you are tired.  Learn to manage your stress.  Do not use any tobacco products, such as cigarettes, chewing tobacco, and e-cigarettes. If you need help quitting, ask your health care provider.  Limit alcohol intake to no more than 1 drink a day for nonpregnant women and 2 drinks a day for men. One drink equals 12 oz of beer, 5 oz of wine, or 1 oz of hard liquor.  Do not abuse drugs. General instructions  Take over-the-counter and prescription medicines only as told by your health care provider.  Manage other health conditions as told by your health care provider.  Keep all follow-up visits as told by your health care provider. This is important. Contact a health care provider if:  You have chest pain or discomfort. This includes squeezing chest pain that may feel like indigestion (angina).  You have shortness of breath.  You have an irregular heartbeat.  You have unexplained fatigue.  You have unexplained pain or numbness in an arm, leg, or hip.  You have nausea, swelling of your hands or feet, and itchy  skin. Get help right away if:  You have symptoms of a heart attack, such as: ? Chest pain. ? Shortness of breath. ? Pain in your neck, jaw, arms, back, or stomach. ? Cold sweat. ? Nausea. ? Light-headedness.  You have symptoms of a stroke, such as sudden: ? Weakness on one side of your body. ? Confusion. ? Changes in vision. ? Inability to speak or understand speech. ? Loss of balance, coordination, or ability to walk. ? Severe headache. ? Loss of consciousness. These symptoms may represent a serious problem that is an emergency. Do not wait to see if the symptoms will go away. Get medical help right away. Call your local emergency services (911 in the [place] Do not drive yourself to the hospital. This information is not intended to replace advice given to you by your health care provider. Make sure you discuss any questions you have with your health care provider. Document Released: 03/04/2004 Document Revised: 05/20/2016 Document Reviewed: 11/03/2015 Elsevier Interactive Patient Education  [employer].

## 2018-07-31 NOTE — Progress Notes (Signed)
Subjective: CC: circulation issue PCP: [name], DO [name] is a 72 y.o. male presenting to clinic today for:  1. Abnormal ABIs/ foot pain Patient reports that he received a visit by his Medicare insurance nurse who performed studies on his legs and was told that these were abnormal and he should follow-up with PCP.  Right leg was 0.49 and left leg was 0.59.  This was assumed to be ABIs.  Referral to vascular was placed on 07/10/2018 but patient has not heard from an appointment yet.  He does note some left lower extremity pain, particular over the dorsal aspect of his left foot but states that he recently had an injury to that foot, about 3 weeks ago, where a piece of the motorcycle that he was working on fell onto the foot.  He notes that there was associated bruising and pain.  He continues to have some pain in the midfoot with walking but is able to ambulate independently.  2.  History of carotid stent Patient reports history of carotid stent placement on the left after he sustained a CVA several years ago.  He notes he had an ultrasound done a couple of years ago but thinks that he is overdue for repeat.  Denies any new stroke symptoms.  He has predominantly left-sided weakness and sensation changes as result of CVA in the past.  ROS: Per HPI  Allergies  Allergen Reactions  . Atorvastatin Other (See Comments)    Muscle aches  . Crestor [Rosuvastatin Calcium] Other (See Comments)    Muscle aches  . Niaspan [Niacin Er] Other (See Comments)    Muscle aches  . Iodinated Diagnostic Agents Rash    rash   Past Medical History:  Diagnosis Date  . Acid reflux   . Carotid artery occlusion    right carotid stenting in 2007  . Chest pain   . Coronary artery disease    S/P NSTEMI and CABGS for LM and 3v CAD 06/2009  . CVA (cerebral vascular accident) ([place])   . Hyperlipidemia   . Obesity     Current Outpatient Medications:  .  aspirin 81 MG tablet, Take 81 mg by  mouth daily.  , Disp: , Rfl:  .  chlorthalidone (HYGROTON) 25 MG tablet, TAKE ONE TABLET BY MOUTH ONCE DAILY, Disp: 30 tablet, Rfl: 11 .  citalopram (CELEXA) 40 MG tablet, Take 0.5 tablets (20 mg total) by mouth daily., Disp: 45 tablet, Rfl: 3 .  nitroGLYCERIN (NITROSTAT) 0.4 MG SL tablet, Place 1 tablet (0.4 mg total) under the tongue every 5 (five) minutes as needed for chest pain., Disp: 25 tablet, Rfl: 3 .  simvastatin (ZOCOR) 20 MG tablet, TAKE 1/2 (ONE-HALF) TABLET BY MOUTH ONCE DAILY AT  6PM, Disp: 45 tablet, Rfl: 1 Social History   Socioeconomic History  . Marital status: Married    Spouse name: [name]  . Number of children: 2  . Years of education: Not on file  . Highest education level: Not on file  Occupational History  . Occupation: Retired  Scientific laboratory technician  . Financial resource strain: Not hard at all  . Food insecurity:    Worry: Never true    Inability: Never true  . Transportation needs:    Medical: No    Non-medical: No  Tobacco Use  . Smoking status: Former Smoker    Packs/day: 1.50    Years: 41.00    Pack years: 61.50    Types: Cigarettes  Start date: 02/04/1965    Last attempt to quit: 12/27/2005    Years since quitting: 12.6  . Smokeless tobacco: Never Used  Substance and Sexual Activity  . Alcohol use: No    Alcohol/week: 0.0 oz  . Drug use: No  . Sexual activity: Not Currently  Lifestyle  . Physical activity:    Days per week: 0 days    Minutes per session: 0 min  . Stress: Not at all  Relationships  . Social connections:    Talks on phone: More than three times a week    Gets together: More than three times a week    Attends religious service: More than 4 times per year    Active member of club or organization: Yes    Attends meetings of clubs or organizations: More than 4 times per year    Relationship status: Married  . Intimate partner violence:    Fear of current or ex partner: No    Emotionally abused: No    Physically abused: No     Forced sexual activity: No  Other Topics Concern  . Not on file  Social History Narrative  . Not on file   Family History  Problem Relation Age of Onset  . Heart attack Father   . Heart attack Brother     Objective: Office vital signs reviewed. BP 139/81   Pulse 77   Temp (!) 97.5 F (36.4 C) (Oral)   Ht 5\' 10"  (1.778 m)   Wt 224 lb (101.6 kg)   BMI 32.14 kg/m   Physical Examination:  General: Awake, alert, well nourished, No acute distress Neck: No appreciable carotid bruits. Extremities: cool, No edema, cyanosis or clubbing; +1 pedal pulses bilaterally MSK: normal gait and normal station  Left foot: No gross discoloration or palpable bony abnormalities.  He has limited active range of motion secondary to chronic weakness after CVA on left.  He does have tenderness to palpation over the midfoot, particularly the dorsum of the midfoot.  No palpable bony abnormalities. Skin: dry; intact; no rashes or lesions   Assessment/ Plan: 72 y.o. male   1. Abnormal ankle brachial index (ABI) The studies that he is concerned about sounds like an abnormal ABI.  I did place a referral to vascular couple of weeks ago but it appears that this had not been scheduled.  Our office reached out to the specialist today and we were able to secure an appointment September 9 for the patient.  I have ordered repeat ABIs and a carotid duplex in preparation for this appointment.  Home care instructions were reviewed with the patient.  Follow-up as needed - US ARTERIAL ABI (SCREENING LOWER EXTREMITY); Future  2. History of CVA (cerebrovascular accident) - US Carotid Duplex Bilateral; Future  3. History of carotid artery stenosis - US Carotid Duplex Bilateral; Future  4. Left foot pain After trauma to the foot.  Will obtain x-ray per patient's request to further evaluate.  No focal acute findings on today's exam. - DG Foot Complete Left; Future   Orders Placed This Encounter  Procedures  . US  Carotid Duplex Bilateral    Standing Status:   Future    Standing Expiration Date:   10/01/2019    Scheduling Instructions:     Mon/ Tues if possible.  Also has ABIs.  If can be done same day, this would be ideal.    Order Specific Question:   Reason for exam:    Answer:  history of carotid stent placement.  2 year f/u u/s recommended in 2015.    Order Specific Question:   Preferred imaging location?    Answer:   [facility]  . US ARTERIAL ABI (SCREENING LOWER EXTREMITY)    Standing Status:   Future    Standing Expiration Date:   10/01/2019    Scheduling Instructions:     Mon/ Tues if possible.  Also has carotid duplex.  If can be done same day, this would be ideal.    Order Specific Question:   Reason for Exam (SYMPTOM  OR DIAGNOSIS REQUIRED)    Answer:   abnormal ABIs by Medicare nurse    Order Specific Question:   Preferred imaging location?    Answer:   [facility] Complete Left    Standing Status:   Future    Number of Occurrences:   1    Standing Expiration Date:   10/01/2019    Order Specific Question:   Reason for Exam (SYMPTOM  OR DIAGNOSIS REQUIRED)    Answer:   Left sided foot pain after trauma 3 weeks ago    Order Specific Question:   Preferred imaging location?    Answer:   Internal    Order Specific Question:   Radiology Contrast Protocol - do NOT remove file path    Answer:   [email or web address]     [name], [place] [phone]

## 2018-08-01 ENCOUNTER — Telehealth: Payer: Self-pay | Admitting: Family Medicine

## 2018-08-01 NOTE — Telephone Encounter (Signed)
Pt notified we are working to schedule appt

## 2018-08-02 ENCOUNTER — Other Ambulatory Visit: Payer: Self-pay

## 2018-08-07 ENCOUNTER — Ambulatory Visit (HOSPITAL_COMMUNITY)
Admission: RE | Admit: 2018-08-07 | Discharge: 2018-08-07 | Disposition: A | Discharge status: Home / Self Care | Payer: Medicare Other | Source: Ambulatory Visit | Attending: Family Medicine | Admitting: Family Medicine

## 2018-08-07 DIAGNOSIS — Z8679 Personal history of other diseases of the circulatory system: Secondary | ICD-10-CM

## 2018-08-07 DIAGNOSIS — Z87891 Personal history of nicotine dependence: Secondary | ICD-10-CM | POA: Insufficient documentation

## 2018-08-07 DIAGNOSIS — Z8673 Personal history of transient ischemic attack (TIA), and cerebral infarction without residual deficits: Secondary | ICD-10-CM | POA: Insufficient documentation

## 2018-08-07 DIAGNOSIS — I6522 Occlusion and stenosis of left carotid artery: Secondary | ICD-10-CM | POA: Diagnosis not present

## 2018-08-07 DIAGNOSIS — I70203 Unspecified atherosclerosis of native arteries of extremities, bilateral legs: Secondary | ICD-10-CM | POA: Diagnosis not present

## 2018-08-07 DIAGNOSIS — I739 Peripheral vascular disease, unspecified: Secondary | ICD-10-CM | POA: Diagnosis not present

## 2018-08-07 DIAGNOSIS — I63232 Cerebral infarction due to unspecified occlusion or stenosis of left carotid arteries: Secondary | ICD-10-CM | POA: Diagnosis not present

## 2018-08-07 DIAGNOSIS — R6889 Other general symptoms and signs: Secondary | ICD-10-CM

## 2018-08-26 ENCOUNTER — Other Ambulatory Visit: Payer: Self-pay | Admitting: Cardiology

## 2018-09-04 ENCOUNTER — Encounter: Payer: Medicare Other | Admitting: Surgery

## 2018-10-24 NOTE — Progress Notes (Signed)
HPI The patient presents for follow up of CAD.  He had CABG in July 2010 with a mildly reduced EF.  Since I last saw him he was noted to have reduced ABIs of 0.83 on the right and 0.77 on the left.  These were ordered because he had decreased distal pulses but he has no complaints of leg pain he has had no ulcers on his feet.  He is been a little limited in his walking by back pain.  He denies any chest pressure, neck or arm discomfort.  He has no new shortness of breath, PND or orthopnea but again he has not been doing his exercises.  He is had no palpitations, presyncope or syncope.   Allergies  Allergen Reactions  . Atorvastatin Other (See Comments)    Muscle aches  . Crestor [Rosuvastatin Calcium] Other (See Comments)    Muscle aches  . Iodinated Diagnostic Agents Rash    rash  . Niaspan [Niacin Er] Other (See Comments)    Muscle aches    Current Outpatient Medications  Medication Sig Dispense Refill  . aspirin 81 MG tablet Take 81 mg by mouth daily.      . chlorthalidone (HYGROTON) 25 MG tablet TAKE ONE TABLET BY MOUTH ONCE DAILY 30 tablet 11  . citalopram (CELEXA) 40 MG tablet Take 0.5 tablets (20 mg total) by mouth daily. 45 tablet 3  . nitroGLYCERIN (NITROSTAT) 0.4 MG SL tablet Place 1 tablet (0.4 mg total) under the tongue every 5 (five) minutes as needed for chest pain. 25 tablet 3  . simvastatin (ZOCOR) 20 MG tablet Take 1 tablet (20 mg total) by mouth daily at 6 PM. 90 tablet 3   No current facility-administered medications for this visit.     Past Medical History:  Diagnosis Date  . Acid reflux   . Carotid artery occlusion    right carotid stenting in 2007  . Chest pain   . Coronary artery disease    S/P NSTEMI and CABGS for LM and 3v CAD 06/2009  . CVA (cerebral vascular accident) ([place])   . Hyperlipidemia   . Obesity     Past Surgical History:  Procedure Laterality Date  . CORONARY ARTERY BYPASS GRAFT    . TONSILLECTOMY      ROS:  As stated in the  HPI and negative for all other systems.  PHYSICAL EXAM BP 104/62   Pulse 64   Ht 5\' 10"  (1.778 m)   Wt 224 lb (101.6 kg)   BMI 32.14 kg/m   GENERAL:  Well appearing NECK:  No jugular venous distention, waveform within normal limits, carotid upstroke brisk and symmetric, no bruits, no thyromegaly LUNGS:  Clear to auscultation bilaterally CHEST:  Well healed sternotomy scar. HEART:  PMI not displaced or sustained,S1 and S2 within normal limits, no S3, no S4, no clicks, no rubs, no murmurs ABD:  Flat, positive bowel sounds normal in frequency in pitch, no bruits, no rebound, no guarding, no midline pulsatile mass, no hepatomegaly, no splenomegaly EXT:  2 plus pulses upper and decreased DP/PT bilateral, no edema, no cyanosis no clubbing  EKG:  Sinus rhythm, rate 64, axis within normal limits, first degree AV block, old anterior infarct, old anteroseptal infarct .  10/25/2018  Lab Results  Component Value Date   CHOL 139 03/02/2018   TRIG 197 (H) 03/02/2018   HDL 35 (L) 03/02/2018   LDLCALC 65 03/02/2018    ASSESSMENT AND PLAN  HTN -  The  blood pressure is at target. No change in medications is indicated. We will continue with therapeutic lifestyle changes (TLC).  CAD, AUTOLOGOUS BYPASS GRAFT -  He has no chest pain and we will continue with aggressive risk reduction.   HYPERLIPIDEMIA-MIXED -  LDL was at target as above.  No change in therapy.  CVA -  He had less than 50% stenosis bilaterally.   This was earlier this year.  No further testing at this point.   OBESITY -  We talked about diet and exercise.   Cardiomyopathy -  He has a mildly reduced ejection fraction on echo in 2010.   I will repeat an echo as it has been years.   PVD I reviewed the results as above.  He does not have any resting symptoms I discussed with him the importance of walking.

## 2018-10-25 ENCOUNTER — Ambulatory Visit: Payer: Medicare Other | Admitting: Cardiology

## 2018-10-25 ENCOUNTER — Encounter: Payer: Self-pay | Admitting: Cardiology

## 2018-10-25 VITALS — BP 104/62 | HR 64 | Ht 70.0 in | Wt 224.0 lb

## 2018-10-25 DIAGNOSIS — I2510 Atherosclerotic heart disease of native coronary artery without angina pectoris: Secondary | ICD-10-CM

## 2018-10-25 DIAGNOSIS — E785 Hyperlipidemia, unspecified: Secondary | ICD-10-CM | POA: Insufficient documentation

## 2018-10-25 DIAGNOSIS — I428 Other cardiomyopathies: Secondary | ICD-10-CM | POA: Diagnosis not present

## 2018-10-25 DIAGNOSIS — I739 Peripheral vascular disease, unspecified: Secondary | ICD-10-CM | POA: Diagnosis not present

## 2018-10-25 DIAGNOSIS — I10 Essential (primary) hypertension: Secondary | ICD-10-CM | POA: Insufficient documentation

## 2018-10-25 MED ORDER — SIMVASTATIN 20 MG PO TABS: 20.0000 mg | ORAL_TABLET | Freq: Every day | ORAL | 3 refills | Status: DC

## 2018-10-25 NOTE — Patient Instructions (Signed)
Medication Instructions:  The current medical regimen is effective;  continue present plan and medications.  If you need a refill on your cardiac medications before your next appointment, please call your pharmacy.   Testing/Procedures: Your physician has requested that you have an echocardiogram. Echocardiography is a painless test that uses sound waves to create images of your heart. It provides your doctor with information about the size and shape of your heart and how well your heart's chambers and valves are working. This procedure takes approximately one hour. There are no restrictions for this procedure.  Follow-Up: Follow up in 1 year with Dr. Percival Spanish in [place].  You will receive a letter in the mail 2 months before you are due.  Please call us in August 2020 to schedule your October 2020 follow up appointment.  Thank you for choosing [facility]!!

## 2018-10-26 ENCOUNTER — Ambulatory Visit (INDEPENDENT_AMBULATORY_CARE_PROVIDER_SITE_OTHER): Payer: Medicare Other

## 2018-10-26 DIAGNOSIS — Z23 Encounter for immunization: Secondary | ICD-10-CM

## 2018-10-31 ENCOUNTER — Ambulatory Visit (HOSPITAL_COMMUNITY)
Admission: RE | Admit: 2018-10-31 | Discharge: 2018-10-31 | Disposition: A | Discharge status: Home / Self Care | Payer: Medicare Other | Source: Ambulatory Visit | Attending: Cardiology | Admitting: Cardiology

## 2018-10-31 DIAGNOSIS — I428 Other cardiomyopathies: Secondary | ICD-10-CM | POA: Diagnosis not present

## 2018-10-31 DIAGNOSIS — I10 Essential (primary) hypertension: Secondary | ICD-10-CM | POA: Insufficient documentation

## 2018-10-31 NOTE — Progress Notes (Signed)
*  PRELIMINARY RESULTS* Echocardiogram 2D Echocardiogram has been performed.  [name] 10/31/2018, 10:56 AM

## 2018-11-06 ENCOUNTER — Telehealth: Payer: Self-pay | Admitting: Cardiology

## 2018-11-06 NOTE — Telephone Encounter (Signed)
New message  Patient is return call for echo results.

## 2018-11-06 NOTE — Telephone Encounter (Signed)
The patient has been notified of the result and verbalized understanding.  All questions (if any) were answered. Julaine Hua, [facility] 11/06/2018 2:58 PM

## 2018-12-16 ENCOUNTER — Other Ambulatory Visit: Payer: Self-pay | Admitting: Cardiology

## 2019-01-31 ENCOUNTER — Ambulatory Visit (INDEPENDENT_AMBULATORY_CARE_PROVIDER_SITE_OTHER): Payer: Medicare Other | Admitting: Family Medicine

## 2019-01-31 VITALS — BP 131/81 | HR 84 | Temp 97.8°F | Ht 70.0 in | Wt 226.0 lb

## 2019-01-31 DIAGNOSIS — H1013 Acute atopic conjunctivitis, bilateral: Secondary | ICD-10-CM

## 2019-01-31 DIAGNOSIS — H938X3 Other specified disorders of ear, bilateral: Secondary | ICD-10-CM

## 2019-01-31 MED ORDER — OLOPATADINE HCL 0.1 % OP SOLN: 1.0000 [drp] | Freq: Two times a day (BID) | OPHTHALMIC | 2 refills | Status: DC

## 2019-01-31 MED ORDER — FLUOCINOLONE ACETONIDE 0.01 % OT OIL: TOPICAL_OIL | OTIC | 0 refills | Status: DC

## 2019-01-31 NOTE — Progress Notes (Signed)
Subjective: CC: Ear concerns/matting eyes PCP: [name], DO PIR:JJOA [name] is a 73 y.o. male presenting to clinic today for:  1.  Ear concerns Patient reports difficulty hearing bilaterally.  He also goes on to state that his ears itch him at times.  He brings in a piece of paper with flunisolide oil and asks if he can have this prescribed.  He notes that a friend of his had similar symptoms and this helped him hear better.  Of note, patient wears hearing aids and was diagnosed with hearing loss secondary to barotrauma and previous CVA.  Denies any ear drainage, fevers.  2.  Matting of the eyelids Patient reports that he has had issues with matting of his eyelids every morning for many months now.  He denies any associated burning of the eyes, change in vision, discharge.  No sinus symptoms including sneezing, cough or postnasal drip.  He has not tried any medications for this.   ROS: Per HPI  Allergies  Allergen Reactions  . Atorvastatin Other (See Comments)    Muscle aches  . Crestor [Rosuvastatin Calcium] Other (See Comments)    Muscle aches  . Iodinated Diagnostic Agents Rash    rash  . Niaspan [Niacin Er] Other (See Comments)    Muscle aches   Past Medical History:  Diagnosis Date  . Acid reflux   . Carotid artery occlusion    right carotid stenting in 2007  . Chest pain   . Coronary artery disease    S/P NSTEMI and CABGS for LM and 3v CAD 06/2009  . CVA (cerebral vascular accident) ([place])   . Hyperlipidemia   . Obesity     Current Outpatient Medications:  .  aspirin 81 MG tablet, Take 81 mg by mouth daily.  , Disp: , Rfl:  .  chlorthalidone (HYGROTON) 25 MG tablet, TAKE 1 TABLET BY MOUTH ONCE DAILY, Disp: 30 tablet, Rfl: 11 .  citalopram (CELEXA) 40 MG tablet, Take 0.5 tablets (20 mg total) by mouth daily., Disp: 45 tablet, Rfl: 3 .  nitroGLYCERIN (NITROSTAT) 0.4 MG SL tablet, Place 1 tablet (0.4 mg total) under the tongue every 5 (five) minutes as  needed for chest pain., Disp: 25 tablet, Rfl: 3 .  simvastatin (ZOCOR) 20 MG tablet, Take 1 tablet (20 mg total) by mouth daily at 6 PM., Disp: 90 tablet, Rfl: 3 .  Fluocinolone Acetonide 0.01 % OIL, Apply small amount with the tip of your finger up to 2 times daily for 2 weeks if needed for ear itching., Disp: 20 mL, Rfl: 0 .  olopatadine (PATANOL) 0.1 % ophthalmic solution, Place 1 drop into both eyes 2 (two) times daily., Disp: 5 mL, Rfl: 2 Social History   Socioeconomic History  . Marital status: Married    Spouse name: [name]  . Number of children: 2  . Years of education: Not on file  . Highest education level: Not on file  Occupational History  . Occupation: Retired  Scientific laboratory technician  . Financial resource strain: Not hard at all  . Food insecurity:    Worry: Never true    Inability: Never true  . Transportation needs:    Medical: No    Non-medical: No  Tobacco Use  . Smoking status: Former Smoker    Packs/day: 1.50    Years: 41.00    Pack years: 61.50    Types: Cigarettes    Start date: 02/04/1965    Last attempt to quit: 12/27/2005  Years since quitting: 13.1  . Smokeless tobacco: Never Used  Substance and Sexual Activity  . Alcohol use: No    Alcohol/week: 0.0 standard drinks  . Drug use: No  . Sexual activity: Not Currently  Lifestyle  . Physical activity:    Days per week: 0 days    Minutes per session: 0 min  . Stress: Not at all  Relationships  . Social connections:    Talks on phone: More than three times a week    Gets together: More than three times a week    Attends religious service: More than 4 times per year    Active member of club or organization: Yes    Attends meetings of clubs or organizations: More than 4 times per year    Relationship status: Married  . Intimate partner violence:    Fear of current or ex partner: No    Emotionally abused: No    Physically abused: No    Forced sexual activity: No  Other Topics Concern  . Not on file  Social  History Narrative  . Not on file   Family History  Problem Relation Age of Onset  . Heart attack Father   . Heart attack Brother     Objective: Office vital signs reviewed. BP 131/81   Pulse 84   Temp 97.8 F (36.6 C) (Oral)   Ht 5\' 10"  (1.778 m)   Wt 226 lb (102.5 kg)   BMI 32.43 kg/m   Physical Examination:  General: Awake, alert, well nourished, No acute distress HEENT: Normal    Ears: Tympanic membranes somewhat obscured by cerumen and debris in external auditory canal.     Eyes: PERRLA, extraocular membranes intact, sclera without erythema. No ocular discharge    Nose: nasal turbinates moist, no nasal discharge    Throat: moist mucus membranes, no erythema, no tonsillar exudate.  Airway is patent  Assessment/ Plan: 73 y.o. male   1. Irritation of both ears We discussed that this would not augment his ability to hear but may help with itching.  Fluocinonide acetonide oil prescribed.  May use twice daily for up to 2 weeks if needed.  Follow-up PRN  2. Allergic conjunctivitis of both eyes Physical exam was fairly unremarkable.  Certainly no evidence of infection.  Will trial Patanol eyedrops.  If no improvement, may need to consider seeing ophthalmology for further evaluation.   No orders of the defined types were placed in this encounter.  Meds ordered this encounter  Medications  . Fluocinolone Acetonide 0.01 % OIL    Sig: Apply small amount with the tip of your finger up to 2 times daily for 2 weeks if needed for ear itching.    Dispense:  20 mL    Refill:  0  . olopatadine (PATANOL) 0.1 % ophthalmic solution    Sig: Place 1 drop into both eyes 2 (two) times daily.    Dispense:  5 mL    Refill:  [place], DO [place] [phone]

## 2019-01-31 NOTE — Patient Instructions (Signed)
We discussed that the ear oil should not be used more than 2 weeks.  It's use is for itchy ears.  It does not reverse hearing damage caused by stroke.  The eye drop should help.  You may use this as long as you find it helpful.

## 2019-03-28 ENCOUNTER — Other Ambulatory Visit: Payer: Self-pay | Admitting: Family Medicine

## 2019-07-02 ENCOUNTER — Other Ambulatory Visit: Payer: Self-pay | Admitting: Family Medicine

## 2019-08-02 DIAGNOSIS — X32XXXD Exposure to sunlight, subsequent encounter: Secondary | ICD-10-CM | POA: Diagnosis not present

## 2019-08-02 DIAGNOSIS — D044 Carcinoma in situ of skin of scalp and neck: Secondary | ICD-10-CM | POA: Diagnosis not present

## 2019-08-02 DIAGNOSIS — L820 Inflamed seborrheic keratosis: Secondary | ICD-10-CM | POA: Diagnosis not present

## 2019-08-02 DIAGNOSIS — L570 Actinic keratosis: Secondary | ICD-10-CM | POA: Diagnosis not present

## 2019-08-02 DIAGNOSIS — D2339 Other benign neoplasm of skin of other parts of face: Secondary | ICD-10-CM | POA: Diagnosis not present

## 2019-08-09 ENCOUNTER — Telehealth: Payer: Self-pay | Admitting: Cardiology

## 2019-08-09 ENCOUNTER — Telehealth: Payer: Self-pay | Admitting: Family Medicine

## 2019-08-09 NOTE — Telephone Encounter (Signed)
televisit made for tomorrow °

## 2019-08-09 NOTE — Telephone Encounter (Signed)
°*  STAT* If patient is at the pharmacy, call can be transferred to refill team.   1. Which medications need to be refilled? (please list name of each medication and dose if known) Citalopram  2. Which pharmacy/location (including street and city if local pharmacy) is medication to be sent to [place]  3. Do they need a 30 day or 90 day supply?  90 days and refills- pt has an appt in October

## 2019-08-09 NOTE — Telephone Encounter (Signed)
Called and notified that this refill should come from primary care doctor, who last refilled. Patient wife verbalized understanding.

## 2019-08-10 ENCOUNTER — Ambulatory Visit (INDEPENDENT_AMBULATORY_CARE_PROVIDER_SITE_OTHER): Payer: Medicare Other | Admitting: Family Medicine

## 2019-08-10 DIAGNOSIS — F325 Major depressive disorder, single episode, in full remission: Secondary | ICD-10-CM | POA: Diagnosis not present

## 2019-08-10 MED ORDER — CITALOPRAM HYDROBROMIDE 20 MG PO TABS: 20.0000 mg | ORAL_TABLET | Freq: Every day | ORAL | 3 refills | Status: DC

## 2019-08-10 NOTE — Progress Notes (Signed)
Telephone visit  Subjective: CC: f/u Depression PCP: [name], DO IEP:PIRJ DEMPSEY [name] is a 73 y.o. male calls for telephone consult today. Patient provides verbal consent for consult held via phone.  Location of patient: home Location of provider: Working remotely from home Others present for call: none  1. Depression Patient reports that he has been doing well. He reports compliance with Celexa 20mg  daily.  He breaks the 40mg  in half.  He has tried to taper off of the medication in the past but gets moody so he has stayed on it.  No SI. No HI.  Sleep is good.   ROS: Per HPI  Allergies  Allergen Reactions  . Atorvastatin Other (See Comments)    Muscle aches  . Crestor [Rosuvastatin Calcium] Other (See Comments)    Muscle aches  . Iodinated Diagnostic Agents Rash    rash  . Niaspan [Niacin Er] Other (See Comments)    Muscle aches   Past Medical History:  Diagnosis Date  . Acid reflux   . Carotid artery occlusion    right carotid stenting in 2007  . Chest pain   . Coronary artery disease    S/P NSTEMI and CABGS for LM and 3v CAD 06/2009  . CVA (cerebral vascular accident) ([place])   . Hyperlipidemia   . Obesity     Current Outpatient Medications:  .  aspirin 81 MG tablet, Take 81 mg by mouth daily.  , Disp: , Rfl:  .  chlorthalidone (HYGROTON) 25 MG tablet, TAKE 1 TABLET BY MOUTH ONCE DAILY, Disp: 30 tablet, Rfl: 11 .  citalopram (CELEXA) 40 MG tablet, Take 0.5 tablets (20 mg total) by mouth daily. (Needs to be seen before next refill), Disp: 15 tablet, Rfl: 0 .  Fluocinolone Acetonide 0.01 % OIL, Apply small amount with the tip of your finger up to 2 times daily for 2 weeks if needed for ear itching., Disp: 20 mL, Rfl: 0 .  nitroGLYCERIN (NITROSTAT) 0.4 MG SL tablet, Place 1 tablet (0.4 mg total) under the tongue every 5 (five) minutes as needed for chest pain., Disp: 25 tablet, Rfl: 3 .  olopatadine (PATANOL) 0.1 % ophthalmic solution, Place 1 drop into both eyes 2  (two) times daily., Disp: 5 mL, Rfl: 2 .  simvastatin (ZOCOR) 20 MG tablet, Take 1 tablet (20 mg total) by mouth daily at 6 PM., Disp: 90 tablet, Rfl: 3  Depression screen [facility] 2/9 08/10/2019 01/31/2019 07/31/2018  Decreased Interest 0 0 0  Down, Depressed, Hopeless 0 0 0  PHQ - 2 Score 0 0 0  Altered sleeping 0 0 -  Tired, decreased energy 0 0 -  Change in appetite 0 0 -  Feeling bad or failure about yourself  0 0 -  Trouble concentrating 0 0 -  Moving slowly or fidgety/restless 0 0 -  Suicidal thoughts 0 0 -  PHQ-9 Score 0 0 -  Difficult doing work/chores Not difficult at all - -   GAD 7 : Generalized Anxiety Score 08/10/2019  Nervous, Anxious, on Edge 0  Control/stop worrying 0  Worry too much - different things 0  Trouble relaxing 0  Restless 0  Easily annoyed or irritable 0  Afraid - awful might happen 0  Total GAD 7 Score 0  Anxiety Difficulty Not difficult at all    Assessment/ Plan: 73 y.o. male   1. Major depressive disorder with single episode, in full remission ([place]) Stable. Celexa refilled.  Changed to 20mg  tablets.  Patient aware of pill form change.  Follow up in October for fasting labs.   Start time: 10:00am End time: 10:06am  Total time spent on patient care (including telephone call/ virtual visit): 10 minutes  [place], [place] [phone]

## 2019-08-30 DIAGNOSIS — D044 Carcinoma in situ of skin of scalp and neck: Secondary | ICD-10-CM | POA: Diagnosis not present

## 2019-08-30 DIAGNOSIS — C44319 Basal cell carcinoma of skin of other parts of face: Secondary | ICD-10-CM | POA: Diagnosis not present

## 2019-08-30 DIAGNOSIS — L570 Actinic keratosis: Secondary | ICD-10-CM | POA: Diagnosis not present

## 2019-08-30 DIAGNOSIS — X32XXXD Exposure to sunlight, subsequent encounter: Secondary | ICD-10-CM | POA: Diagnosis not present

## 2019-10-09 NOTE — Progress Notes (Signed)
HPI The patient presents for follow up of CAD.  He had CABG in July 2010 with a mildly reduced EF.  Since I last saw him he has had no new cardiovascular problems.  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  . He does some activities around his yard. The patient denies any new symptoms such as chest discomfort, neck or arm discomfort. There has been no new shortness of breath, PND or orthopnea. There have been no reported palpitations, presyncope or syncope.    Allergies  Allergen Reactions  . Atorvastatin Other (See Comments)    Muscle aches  . Crestor [Rosuvastatin Calcium] Other (See Comments)    Muscle aches  . Iodinated Diagnostic Agents Rash    rash  . Niaspan [Niacin Er] Other (See Comments)    Muscle aches    Current Outpatient Medications  Medication Sig Dispense Refill  . aspirin 81 MG tablet Take 81 mg by mouth daily.      . chlorthalidone (HYGROTON) 25 MG tablet TAKE 1 TABLET BY MOUTH ONCE DAILY 30 tablet 11  . citalopram (CELEXA) 20 MG tablet Take 1 tablet (20 mg total) by mouth daily. 90 tablet 3  . Fluocinolone Acetonide 0.01 % OIL Apply small amount with the tip of your finger up to 2 times daily for 2 weeks if needed for ear itching. 20 mL 0  . nitroGLYCERIN (NITROSTAT) 0.4 MG SL tablet Place 1 tablet (0.4 mg total) under the tongue every 5 (five) minutes as needed for chest pain. 25 tablet 3  . olopatadine (PATANOL) 0.1 % ophthalmic solution Place 1 drop into both eyes 2 (two) times daily. 5 mL 2  . simvastatin (ZOCOR) 20 MG tablet Take 1 tablet (20 mg total) by mouth daily at 6 PM. 90 tablet 3  . vitamin C (ASCORBIC ACID) 500 MG tablet Take 500 mg by mouth daily.     No current facility-administered medications for this visit.     Past Medical History:  Diagnosis Date  . Acid reflux   . Carotid artery occlusion    right carotid stenting in 2007  . Chest pain   . Coronary artery disease    S/P NSTEMI and CABGS for LM and 3v CAD 06/2009  . CVA (cerebral vascular accident)  ([place])   . Hyperlipidemia   . Obesity     Past Surgical History:  Procedure Laterality Date  . CORONARY ARTERY BYPASS GRAFT    . TONSILLECTOMY      ROS:  As stated in the HPI and negative for all other systems.   PHYSICAL EXAM BP 120/80   Pulse 77   Ht 5\' 10"  (1.778 m)   Wt 224 lb (101.6 kg)   BMI 32.14 kg/m   GENERAL:  Well appearing NECK:  No jugular venous distention, waveform within normal limits, carotid upstroke brisk and symmetric, no bruits, no thyromegaly LUNGS:  Clear to auscultation bilaterally CHEST:  Unremarkable HEART:  PMI not displaced or sustained,S1 and S2 within normal limits, no S3, no S4, no clicks, no rubs, no murmurs ABD:  Flat, positive bowel sounds normal in frequency in pitch, no bruits, no rebound, no guarding, no midline pulsatile mass, no hepatomegaly, no splenomegaly EXT:  2 plus pulses no, no edema, no cyanosis no clubbing   EKG:  Sinus rhythm, rate 77, axis within normal limits, first degree AV block, old anterior infarct, old anteroseptal infarct .  10/10/2019  Lab Results  Component Value Date   CHOL 139 03/02/2018  TRIG 197 (H) 03/02/2018   HDL 35 (L) 03/02/2018   LDLCALC 65 03/02/2018    ASSESSMENT AND PLAN  HTN -  The blood pressure is at target.  No change in therapy.   CAD, AUTOLOGOUS BYPASS GRAFT -  He has had no new symptoms.  No change in therapy.   HYPERLIPIDEMIA-MIXED -  LDL was at target and he is about to get this rechecked.  Continue current therapy with goal LDL less than 70.   CVA -  He had less than 50% stenosis bilaterally last year.  I will follow-up with carotid Dopplers to be scheduled  OBESITY -  We talked about exercise to help with weight loss.   Cardiomyopathy -  He has a mildly reduced ejection fraction on echo in Nov of last year.  This was unchanged from 2010 he has no symptoms.  No change in therapy.  PVD He does have reduced pulses but denies claudication.  No change in therapy.  CYST He has  what feels like a lipoma on his neck although he has been told it was a cyst.  He wants to have this resected and I will give him a name for a general surgeon.  ABNORMAL EKG:   He has an abnormal EKG but no symptoms related to bradycardia arrhythmia.  No change in therapy.

## 2019-10-10 ENCOUNTER — Ambulatory Visit: Payer: Medicare Other | Admitting: Cardiology

## 2019-10-10 ENCOUNTER — Other Ambulatory Visit: Payer: Self-pay

## 2019-10-10 ENCOUNTER — Other Ambulatory Visit: Payer: Self-pay | Admitting: *Deleted

## 2019-10-10 ENCOUNTER — Encounter: Payer: Self-pay | Admitting: Cardiology

## 2019-10-10 VITALS — BP 120/80 | HR 77 | Ht 70.0 in | Wt 224.0 lb

## 2019-10-10 DIAGNOSIS — E785 Hyperlipidemia, unspecified: Secondary | ICD-10-CM

## 2019-10-10 DIAGNOSIS — I739 Peripheral vascular disease, unspecified: Secondary | ICD-10-CM

## 2019-10-10 DIAGNOSIS — I2510 Atherosclerotic heart disease of native coronary artery without angina pectoris: Secondary | ICD-10-CM

## 2019-10-10 DIAGNOSIS — I10 Essential (primary) hypertension: Secondary | ICD-10-CM

## 2019-10-10 DIAGNOSIS — I420 Dilated cardiomyopathy: Secondary | ICD-10-CM | POA: Diagnosis not present

## 2019-10-10 MED ORDER — NITROGLYCERIN 0.4 MG SL SUBL: 0.4000 mg | SUBLINGUAL_TABLET | SUBLINGUAL | 3 refills | Status: DC | PRN

## 2019-10-10 NOTE — Patient Instructions (Signed)
Medication Instructions:  The current medical regimen is effective;  continue present plan and medications.  If you need a refill on your cardiac medications before your next appointment, please call your pharmacy.   Testing/Procedures: Your physician has requested that you have a carotid duplex. This test is an ultrasound of the carotid arteries in your neck. It looks at blood flow through these arteries that supply the brain with blood. Allow one hour for this exam. There are no restrictions or special instructions. You will be called to be scheduled for this.  This testing will be completed at [facility].  Follow-Up: Follow up in 1 year with Dr. Percival Spanish.  You will receive a letter in the mail 2 months before you are due.  Please call us when you receive this letter to schedule your follow up appointment.  Thank you for choosing [facility]!!

## 2019-10-22 ENCOUNTER — Ambulatory Visit (HOSPITAL_COMMUNITY)
Admission: RE | Admit: 2019-10-22 | Discharge: 2019-10-22 | Disposition: A | Discharge status: Home / Self Care | Payer: Medicare Other | Source: Ambulatory Visit | Attending: Cardiology | Admitting: Cardiology

## 2019-10-22 ENCOUNTER — Other Ambulatory Visit: Payer: Self-pay

## 2019-10-22 DIAGNOSIS — I739 Peripheral vascular disease, unspecified: Secondary | ICD-10-CM | POA: Diagnosis not present

## 2019-10-22 DIAGNOSIS — I6523 Occlusion and stenosis of bilateral carotid arteries: Secondary | ICD-10-CM | POA: Diagnosis not present

## 2019-11-15 ENCOUNTER — Ambulatory Visit: Payer: Medicare Other

## 2019-11-17 ENCOUNTER — Other Ambulatory Visit: Payer: Self-pay | Admitting: Cardiology

## 2019-12-25 ENCOUNTER — Other Ambulatory Visit: Payer: Self-pay

## 2019-12-25 NOTE — Patient Outreach (Signed)
[facility]) Care Management  12/25/2019  [name] 09/24/1946 [id number]   Medication Adherence call to [name] Hipp[place] Identifiers Verify spoke with patient he is past due on  Simvastatin 20 mg,patient explain he is to take 1 tablet daily but,sometimes he forgets to take it,patient explain he has 15 more days,patient will order when finished. [name].  [place] Management Direct Dial [phone]  Fax [phone] [email or web address]

## 2020-02-08 ENCOUNTER — Other Ambulatory Visit: Payer: Self-pay | Admitting: Cardiology

## 2020-03-01 ENCOUNTER — Ambulatory Visit: Payer: Medicare Other | Attending: Internal Medicine

## 2020-03-01 DIAGNOSIS — Z23 Encounter for immunization: Secondary | ICD-10-CM

## 2020-03-01 NOTE — Progress Notes (Signed)
   Covid-19 Vaccination Clinic  Name:  [name]    MRN: [id number] DOB: 06-17-1946  03/01/2020  [name] was observed post Covid-19 immunization for 15 minutes without incident. He was provided with Vaccine Information Sheet and instruction to access the V-Safe system.   [name] was instructed to call 911 with any severe reactions post vaccine: Marland Kitchen Difficulty breathing  . Swelling of face and throat  . A fast heartbeat  . A bad rash all over body  . Dizziness and weakness   Immunizations Administered    Name Date Dose VIS Date Route   Moderna COVID-19 Vaccine 03/01/2020  8:37 AM 0.5 mL 11/27/2019 Intramuscular   Manufacturer: Moderna   Lot: [id number]   [place]:[id number]

## 2020-03-07 ENCOUNTER — Telehealth: Payer: Self-pay | Admitting: Cardiology

## 2020-03-07 NOTE — Telephone Encounter (Signed)
Patient walked into clinic with c/o intermittent sharp chest pain that last one second that occurred once on yesterday and once today. Did not use nitroglycerin. Denies sob or dizziness. Denies active chest pain. First available virtual visit arranged 03/13/20 w/[place]. Advised if symptoms return or if he develops other symptoms, to go to the ED for an evaluation. Verbalized understanding of plan.  Patient verbally consented for tele-health visits with [facility] and understands that his/her insurance company will be billed for the encounter.  Aware to have vitals available

## 2020-03-07 NOTE — Telephone Encounter (Signed)
Pt c/o of Chest Pain: 1. Are you having CP right now? NO  2. Are you experiencing any other symptoms (ex. SOB, nausea, vomiting, sweating)? No  3. How long have you been experiencing CP? Since yesterday  4. Is your CP continuous or coming and going? Sharpe pains -coming and going. 5. Have you taken Nitroglycerin? NO

## 2020-03-11 ENCOUNTER — Ambulatory Visit (INDEPENDENT_AMBULATORY_CARE_PROVIDER_SITE_OTHER): Payer: Medicare Other | Admitting: Family Medicine

## 2020-03-11 DIAGNOSIS — K5904 Chronic idiopathic constipation: Secondary | ICD-10-CM

## 2020-03-11 NOTE — Patient Instructions (Addendum)
High-Fiber Diet Fiber, also called dietary fiber, is a type of carbohydrate that is found in fruits, vegetables, whole grains, and beans. A high-fiber diet can have many health benefits. Your health care provider may recommend a high-fiber diet to help:  Prevent constipation. Fiber can make your bowel movements more regular.  Lower your cholesterol.  Relieve the following conditions: ? Swelling of veins in the anus (hemorrhoids). ? Swelling and irritation (inflammation) of specific areas of the digestive tract (uncomplicated diverticulosis). ? A problem of the large intestine (colon) that sometimes causes pain and diarrhea (irritable bowel syndrome, IBS).  Prevent overeating as part of a weight-loss plan.  Prevent heart disease, type 2 diabetes, and certain cancers. What is my plan? The recommended daily fiber intake in grams (g) includes:  75 g for men age 50 or younger.  75 g for men over age 50.  75 g for women age 50 or younger.  75 g for women over age 50. You can get the recommended daily intake of dietary fiber by:  Eating a variety of fruits, vegetables, grains, and beans.  Taking a fiber supplement, if it is not possible to get enough fiber through your diet. What do I need to know about a high-fiber diet?  It is better to get fiber through food sources rather than from fiber supplements. There is not a lot of research about how effective supplements are.  Always check the fiber content on the nutrition facts label of any prepackaged food. Look for foods that contain 5 g of fiber or more per serving.  Talk with a diet and nutrition specialist (dietitian) if you have questions about specific foods that are recommended or not recommended for your medical condition, especially if those foods are not listed below.  Gradually increase how much fiber you consume. If you increase your intake of dietary fiber too quickly, you may have bloating, cramping, or gas.  Drink plenty  of water. Water helps you to digest fiber. What are tips for following this plan?  Eat a wide variety of high-fiber foods.  Make sure that half of the grains that you eat each day are whole grains.  Eat breads and cereals that are made with whole-grain flour instead of refined flour or white flour.  Eat brown rice, bulgur wheat, or millet instead of white rice.  Start the day with a breakfast that is high in fiber, such as a cereal that contains 5 g of fiber or more per serving.  Use beans in place of meat in soups, salads, and pasta dishes.  Eat high-fiber snacks, such as berries, raw vegetables, nuts, and popcorn.  Choose whole fruits and vegetables instead of processed forms like juice or sauce. What foods can I eat?  Fruits Berries. Pears. Apples. Oranges. Avocado. Prunes and raisins. Dried figs. Vegetables Sweet potatoes. Spinach. Kale. Artichokes. Cabbage. Broccoli. Cauliflower. Green peas. Carrots. Squash. Grains Whole-grain breads. Multigrain cereal. Oats and oatmeal. Brown rice. Barley. Bulgur wheat. Millet. Quinoa. Bran muffins. Popcorn. Rye wafer crackers. Meats and other proteins Navy, kidney, and pinto beans. Soybeans. Split peas. Lentils. Nuts and seeds. Dairy Fiber-fortified yogurt. Beverages Fiber-fortified soy milk. Fiber-fortified orange juice. Other foods Fiber bars. The items listed above may not be a complete list of recommended foods and beverages. Contact a dietitian for more options. What foods are not recommended? Fruits Fruit juice. Cooked, strained fruit. Vegetables Fried potatoes. Canned vegetables. Well-cooked vegetables. Grains White bread. Pasta made with refined flour. White rice. Meats and other   proteins Fatty cuts of meat. Fried chicken or fried fish. Dairy Milk. Yogurt. Cream cheese. Sour cream. Fats and oils Butters. Beverages Soft drinks. Other foods Cakes and pastries. The items listed above may not be a complete list of foods  and beverages to avoid. Contact a dietitian for more information. Summary  Fiber is a type of carbohydrate. It is found in fruits, vegetables, whole grains, and beans.  There are many health benefits of eating a high-fiber diet, such as preventing constipation, lowering blood cholesterol, helping with weight loss, and reducing your risk of heart disease, diabetes, and certain cancers.  Gradually increase your intake of fiber. Increasing too fast can result in cramping, bloating, and gas. Drink plenty of water while you increase your fiber.  The best sources of fiber include whole fruits and vegetables, whole grains, nuts, seeds, and beans. This information is not intended to replace advice given to you by your health care provider. Make sure you discuss any questions you have with your health care provider. Document Revised: 10/17/2017 Document Reviewed: 10/17/2017 Elsevier Patient Education  [place].   Constipation, Adult Constipation is when a person:  Poops (has a bowel movement) fewer times in a week than normal.  Has a hard time pooping.  Has poop that is dry, hard, or bigger than normal. Follow these instructions at home: Eating and drinking   Eat foods that have a lot of fiber, such as: ? Fresh fruits and vegetables. ? Whole grains. ? Beans.  Eat less of foods that are high in fat, low in fiber, or overly processed, such as: ? [place] fries. ? Hamburgers. ? Cookies. ? Candy. ? Soda.  Drink enough fluid to keep your pee (urine) clear or pale yellow. General instructions  Exercise regularly or as told by your doctor.  Go to the restroom when you feel like you need to poop. Do not hold it in.  Take over-the-counter and prescription medicines only as told by your doctor. These include any fiber supplements.  Do pelvic floor retraining exercises, such as: ? Doing deep breathing while relaxing your lower belly (abdomen). ? Relaxing your pelvic floor while  pooping.  Watch your condition for any changes.  Keep all follow-up visits as told by your doctor. This is important. Contact a doctor if:  You have pain that gets worse.  You have a fever.  You have not pooped for 4 days.  You throw up (vomit).  You are not hungry.  You lose weight.  You are bleeding from the anus.  You have thin, pencil-like poop (stool). Get help right away if:  You have a fever, and your symptoms suddenly get worse.  You leak poop or have blood in your poop.  Your belly feels hard or bigger than normal (is bloated).  You have very bad belly pain.  You feel dizzy or you faint. This information is not intended to replace advice given to you by your health care provider. Make sure you discuss any questions you have with your health care provider. Document Revised: 11/25/2017 Document Reviewed: 06/02/2016 Elsevier Patient Education  2020 [employer].

## 2020-03-11 NOTE — Progress Notes (Signed)
Telephone visit  Subjective: CC: constipation PCP: [name], DO [id number] [name] is a 74 y.o. male calls for telephone consult today. Patient provides verbal consent for consult held via phone.  Due to COVID-19 pandemic this visit was conducted virtually. This visit type was conducted due to national recommendations for restrictions regarding the COVID-19 Pandemic (e.g. social distancing, sheltering in place) in an effort to limit this patient's exposure and mitigate transmission in our community. All issues noted in this document were discussed and addressed.  A physical exam was not performed with this format.   Location of patient: home Location of provider: Working remotely from home Others present for call: none  1. Constipation Patient reports that he has been having trouble using the bathroom/ having a BM for years but symptoms have been worse since his stroke.  He has changed his diet to include more fiber, which has helped some.  He has used colon cleanse for 10 years.  Last BM was this am.  BM was small.  No pellet/ ball like stool.  He is not straining currently.  Denies rectal bleeding, itching, burning.  Denies nausea, vomiting, intense abdominal pain.  He is increasing his water.  He also has gotten 1/2 of his COVID19 vaccines, Moderna at [facility]   ROS: Per HPI  Allergies  Allergen Reactions  . Atorvastatin Other (See Comments)    Muscle aches  . Crestor [Rosuvastatin Calcium] Other (See Comments)    Muscle aches  . Iodinated Diagnostic Agents Rash    rash  . Niaspan [Niacin Er] Other (See Comments)    Muscle aches   Past Medical History:  Diagnosis Date  . Acid reflux   . Carotid artery occlusion    right carotid stenting in 2007  . Chest pain   . Coronary artery disease    S/P NSTEMI and CABGS for LM and 3v CAD 06/2009  . CVA (cerebral vascular accident) ([place])   . Hyperlipidemia   . Obesity     Current Outpatient Medications:  .  aspirin 81 MG  tablet, Take 81 mg by mouth daily.  , Disp: , Rfl:  .  chlorthalidone (HYGROTON) 25 MG tablet, Take 1 tablet by mouth once daily, Disp: 90 tablet, Rfl: 2 .  citalopram (CELEXA) 20 MG tablet, Take 1 tablet (20 mg total) by mouth daily., Disp: 90 tablet, Rfl: 3 .  Fluocinolone Acetonide 0.01 % OIL, Apply small amount with the tip of your finger up to 2 times daily for 2 weeks if needed for ear itching., Disp: 20 mL, Rfl: 0 .  nitroGLYCERIN (NITROSTAT) 0.4 MG SL tablet, Place 1 tablet (0.4 mg total) under the tongue every 5 (five) minutes as needed for chest pain., Disp: 25 tablet, Rfl: 3 .  olopatadine (PATANOL) 0.1 % ophthalmic solution, Place 1 drop into both eyes 2 (two) times daily., Disp: 5 mL, Rfl: 2 .  simvastatin (ZOCOR) 20 MG tablet, TAKE 1 TABLET BY MOUTH ONCE DAILY AT 6 PM, Disp: 90 tablet, Rfl: 0 .  vitamin C (ASCORBIC ACID) 500 MG tablet, Take 500 mg by mouth daily., Disp: , Rfl:   Assessment/ Plan: 74 y.o. male   1. Chronic idiopathic constipation Symptoms are improving with change in diet.  I encouraged him to continue adequate water intake and fiber.  We discussed various ways to include fiber in his diet today.  I am also going to mail him out a handout with other sources of fiber.  At this point, he  is having improvement in bowel movement and not having any red flag signs or symptoms.  Will not pursue any medications at this time but we discussed reasons to be reevaluated.  He voiced good understanding of the plan and will follow up as needed   Start time: 8:15am End time: 8:27am  Total time spent on patient care (including telephone call/ virtual visit): 22 minutes  [place], [place] [phone]

## 2020-03-13 ENCOUNTER — Ambulatory Visit: Payer: Medicare Other | Admitting: Student

## 2020-03-13 ENCOUNTER — Encounter: Payer: Self-pay | Admitting: Student

## 2020-03-13 VITALS — BP 118/60 | HR 78 | Temp 99.0°F | Ht 70.5 in | Wt 224.0 lb

## 2020-03-13 DIAGNOSIS — I25119 Atherosclerotic heart disease of native coronary artery with unspecified angina pectoris: Secondary | ICD-10-CM

## 2020-03-13 DIAGNOSIS — I6523 Occlusion and stenosis of bilateral carotid arteries: Secondary | ICD-10-CM

## 2020-03-13 DIAGNOSIS — R079 Chest pain, unspecified: Secondary | ICD-10-CM | POA: Diagnosis not present

## 2020-03-13 DIAGNOSIS — R9431 Abnormal electrocardiogram [ECG] [EKG]: Secondary | ICD-10-CM | POA: Diagnosis not present

## 2020-03-13 DIAGNOSIS — E785 Hyperlipidemia, unspecified: Secondary | ICD-10-CM | POA: Diagnosis not present

## 2020-03-13 DIAGNOSIS — I10 Essential (primary) hypertension: Secondary | ICD-10-CM

## 2020-03-13 NOTE — Patient Instructions (Signed)
Medication Instructions:  Your physician recommends that you continue on your current medications as directed. Please refer to the Current Medication list given to you today.   Labwork: CMET FASTING LIPID   Testing/Procedures: Your physician has requested that you have a lexiscan myoview. For further information please visit [email or web address]. Please follow instruction sheet, as given.    Follow-Up: Your physician recommends that you schedule a follow-up appointment in: October 2021    Any Other Special Instructions Will Be Listed Below (If Applicable).     If you need a refill on your cardiac medications before your next appointment, please call your pharmacy.

## 2020-03-13 NOTE — Progress Notes (Signed)
Cardiology Office Note    Date:  03/13/2020   ID:  Haigen, Rials 1946-01-31, MRN [id number]  PCP:  [name], DO  Cardiologist: Minus Breeding, MD    Chief Complaint  Patient presents with  . Follow-up    episodes of chest pain    History of Present Illness:    [name] is a 74 y.o. male with past medical history of CAD (s/p CABG in 2010 with LIMA-LAD, SVG-D1, SVG-OM2 and SVD-PDA), HTN, HLD, carotid artery stenosis (s/p right carotid stenting), and history of CVA who presents to the office today for evaluation of chest pain.   He was last examined by Dr. Percival Spanish in 09/2019 and denied any recent chest pain or dyspnea on exertion at that time. He was continued on his current medication regimen including ASA 81 mg daily, Chlorthalidone 25mg  daily and Simvastatin 20mg  daily.   In talking with the patient today, he reports doing well until last week when he developed to different episodes of chest pain. 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  He had a recurrent episode later that week after eating lunch which again lasted for about 1 minute. Did not have to take SL NTG. His wife reports this was alarming to her as he wanted her to take him to the [place] office to be examined which is out of character as he never complains about symptoms. He reports these were his first episodes of chest discomfort in many years.    He denies any dyspnea on exertion or fatigue. No recent orthopnea, PND or lower extremity edema.   Past Medical History:  Diagnosis Date  . Acid reflux   . Carotid artery occlusion    right carotid stenting in 2007  . Chest pain   . Coronary artery disease    a. 2010 --> cath showing LM and 3-vessel CAD --> s/p CABG with LIMA-LAD, SVG-D1, SVG-OM2 and SVD-PDA  . CVA (cerebral vascular accident) ([place])   .  Hyperlipidemia   . Obesity     Past Surgical History:  Procedure Laterality Date  . CORONARY ARTERY BYPASS GRAFT    . TONSILLECTOMY      Current Medications: Outpatient Medications Prior to Visit  Medication Sig Dispense Refill  . aspirin 81 MG tablet Take 81 mg by mouth daily.      . chlorthalidone (HYGROTON) 25 MG tablet Take 1 tablet by mouth once daily 90 tablet 2  . citalopram (CELEXA) 20 MG tablet Take 1 tablet (20 mg total) by mouth daily. 90 tablet 3  . nitroGLYCERIN (NITROSTAT) 0.4 MG SL tablet Place 1 tablet (0.4 mg total) under the tongue every 5 (five) minutes as needed for chest pain. 25 tablet 3  . simvastatin (ZOCOR) 20 MG tablet TAKE 1 TABLET BY MOUTH ONCE DAILY AT 6 PM 90 tablet 0  . vitamin C (ASCORBIC ACID) 500 MG tablet Take 500 mg by mouth daily.    . Fluocinolone Acetonide 0.01 % OIL Apply small amount with the tip of your finger up to 2 times daily for 2 weeks if needed for ear itching. 20 mL 0  . olopatadine (PATANOL) 0.1 % ophthalmic solution Place 1 drop into both eyes 2 (two) times daily. 5 mL 2   No facility-administered medications prior to visit.     Allergies:  Atorvastatin, Crestor [rosuvastatin calcium], Iodinated diagnostic agents, and Niaspan [niacin er]   Social History   Socioeconomic History  . Marital status: Married    Spouse name: [name]  . Number of children: 2  . Years of education: Not on file  . Highest education level: Not on file  Occupational History  . Occupation: Retired  Tobacco Use  . Smoking status: Former Smoker    Packs/day: 1.50    Years: 41.00    Pack years: 61.50    Types: Cigarettes    Start date: 02/04/1965    Quit date: 12/27/2005    Years since quitting: 14.2  . Smokeless tobacco: Never Used  Substance and Sexual Activity  . Alcohol use: No    Alcohol/week: 0.0 standard drinks  . Drug use: No  . Sexual activity: Not Currently  Other Topics Concern  . Not on file  Social History Narrative  . Not on file     Social Determinants of Health   Financial Resource Strain:   . Difficulty of Paying Living Expenses:   Food Insecurity:   . Worried About Charity fundraiser in the Last Year:   . Arboriculturist in the Last Year:   Transportation Needs:   . Film/video editor (Medical):   Marland Kitchen Lack of Transportation (Non-Medical):   Physical Activity:   . Days of Exercise per Week:   . Minutes of Exercise per Session:   Stress:   . Feeling of Stress :   Social Connections:   . Frequency of Communication with Friends and Family:   . Frequency of Social Gatherings with Friends and Family:   . Attends Religious Services:   . Active Member of Clubs or Organizations:   . Attends Archivist Meetings:   Marland Kitchen Marital Status:      Family History:  The patient's family history includes Heart attack in his brother and father.   Review of Systems:   Please see the history of present illness.     General:  No chills, fever, night sweats or weight changes.  Cardiovascular:  No dyspnea on exertion, edema, orthopnea, palpitations, paroxysmal nocturnal dyspnea. Positive for chest pain.  Dermatological: No rash, lesions/masses Respiratory: No cough, dyspnea Urologic: No hematuria, dysuria Abdominal:   No nausea, vomiting, diarrhea, bright red blood per rectum, melena, or hematemesis Neurologic:  No visual changes, wkns, changes in mental status. All other systems reviewed and are otherwise negative except as noted above.   Physical Exam:    VS:  BP 118/60   Pulse 78   Temp 99 F (37.2 C)   Ht 5' 10.5" (1.791 m)   Wt 224 lb (101.6 kg)   SpO2 96%   BMI 31.69 kg/m    General: Well developed, well nourished,male appearing in no acute distress. Head: Normocephalic, atraumatic, sclera non-icteric.  Neck: No carotid bruits. JVD not elevated.  Lungs: Respirations regular and unlabored, without wheezes or rales.  Heart: Regular rate and rhythm. No S3 or S4.  No murmur, no rubs, or gallops  appreciated. Abdomen: Soft, non-tender, non-distended. No obvious abdominal masses. Msk:  Strength and tone appear normal for age. No obvious joint deformities or effusions. Extremities: No clubbing or cyanosis. No lower extremity edema. Distal pedal pulses are 2+ bilaterally. Neuro: Alert and oriented X 3. Moves all extremities spontaneously. No focal deficits noted. Psych:  Responds to questions appropriately with a normal affect. Skin: No rashes or lesions noted  Wt Readings from Last 3 Encounters:  03/13/20 224 lb (101.6 kg)  10/10/19 224 lb (101.6 kg)  01/31/19 226 lb (102.5 kg)     Studies/Labs Reviewed:   EKG:  EKG is ordered today.  The ekg ordered today demonstrates NSR, HR 74 with 1st degree AV Block. TWI along inferior leads similar to prior tracings but EKG today shows new TWI along V5 and V6.   Recent Labs: No results found for requested labs within last 8760 hours.   Lipid Panel    Component Value Date/Time   CHOL 139 03/02/2018 1216   CHOL 120 05/25/2013 0828   TRIG 197 (H) 03/02/2018 1216   TRIG 163 (H) 09/10/2013 0854   TRIG 194 (H) 05/25/2013 0828   HDL 35 (L) 03/02/2018 1216   HDL 37 (L) 09/10/2013 0854   HDL 32 (L) 05/25/2013 0828   CHOLHDL 4.0 03/02/2018 1216   CHOLHDL 4 10/07/2009 0931   VLDL 30.0 10/07/2009 0931   LDLCALC 65 03/02/2018 1216   LDLCALC 57 09/10/2013 0854   LDLCALC 49 05/25/2013 0828    Additional studies/ records that were reviewed today include:   Echocardiogram: 10/2018 Study Conclusions   - Left ventricle: The cavity size was normal. Wall thickness was  increased in a pattern of mild LVH. Systolic function was mildly  reduced. The estimated ejection fraction was in the range of 45%  to 50%. There is hypokinesis of the anteroseptal and inferior  myocardium. Indeterminate diastolic function.  - Aortic valve: Moderately calcified annulus. Trileaflet; mildly  calcified leaflets.  - Mitral valve: Mildly calcified  annulus. There was trivial  regurgitation.  - Left atrium: The atrium was mildly dilated.  - Right atrium: Central venous pressure (est): 3 mm Hg.  - Atrial septum: No defect or patent foramen ovale was identified.  - Tricuspid valve: There was trivial regurgitation.  - Pulmonary arteries: Systolic pressure could not be accurately  estimated.  - Pericardium, extracardiac: There was no pericardial effusion.   Carotid Dopplers: 09/2019 IMPRESSION: 1. No significant interval progression of disease compared to 08/07/2018. 2. Widely patent right internal carotid artery stent with no evidence of in stent or edge stenosis. 3. Mild (1-49%) stenosis proximal left internal carotid artery secondary to heterogenous atherosclerotic plaque. 4. The vertebral arteries are patent with normal antegrade flow.  Assessment:    1. Coronary artery disease involving native coronary artery of native heart with angina pectoris (HCC)   2. Chest pain, unspecified type   3. Abnormal EKG   4. Essential hypertension   5. Hyperlipidemia LDL goal <70   6. Bilateral carotid artery stenosis      Plan:   In order of problems listed above:  1. CAD/ Chest Pain/ Abnormal EKG - he is s/p CABG in 2010 with LIMA-LAD, SVG-D1, SVG-OM2 and SVD-PDA.  By review of records, he has not undergone repeat ischemic evaluation since his initial CABG.  His episodes of chest discomfort last week overall seem atypical as he describes them as a shooting pain which would last for less than a minute and spontaneously resolve. - EKG today shows TWI along inferior leads similar to prior tracings but does show new TWI along V5 and V6. Reviewed options with the patient today and will plan to obtain a Lexiscan Myoview for ischemic evaluation. He is unable to walk on a treadmill. - Continue ASA 81 mg daily and Simvastatin 20 mg daily. He does have an updated SL NTG Rx to use if needed. We reviewed today that if he has recurrent symptoms  and pain does not resolve with SL NTG x 3, then he should proceed to the Emergency Department for immediate evaluation.    2. HTN - BP is well controlled at 118/60 during today's visit. Continue Chlorthalidone 25 mg daily.  3. HLD - LDL was at 65 and in 02/2018 but he has not had repeat labs since. Will obtain updated FLP and LFT's. Remains on Simvastatin 20mg  daily.   4. Carotid Artery Stenosis - s/p prior RICA stenting. Dopplers in 09/2019 showed a widely patent right internal carotid artery stent with no evidence of in stent or edge stenosis and [id number] LICA stenosis.  - continue ASA and statin.    Medication Adjustments/Labs and Tests Ordered: Current medicines are reviewed at length with the patient today.  Concerns regarding medicines are outlined above.  Medication changes, Labs and Tests ordered today are listed in the Patient Instructions below. Patient Instructions  Medication Instructions:  Your physician recommends that you continue on your current medications as directed. Please refer to the Current Medication list given to you today.   Labwork: CMET FASTING LIPID   Testing/Procedures: Your physician has requested that you have a lexiscan myoview. For further information please visit [email or web address]. Please follow instruction sheet, as given.  Follow-Up: Your physician recommends that you schedule a follow-up appointment in: October 2021    Any Other Special Instructions Will Be Listed Below (If Applicable).  If you need a refill on your cardiac medications before your next appointment, please call your pharmacy.   Signed, Erma Heritage, PA-C  03/13/2020 5:22 PM    [facility] [address] [place], [place] [address] Phone: [phone] Fax: [phone]

## 2020-03-14 NOTE — Addendum Note (Signed)
Addended by: Levonne Hubert on: 03/14/2020 08:08 AM   Modules accepted: Orders

## 2020-03-19 ENCOUNTER — Telehealth: Payer: Self-pay | Admitting: Family Medicine

## 2020-03-19 NOTE — Chronic Care Management (AMB) (Signed)
Chronic Care Management   Note  03/19/2020 Name: [name] MRN: [id number] DOB: 01/26/1946  [name] is a 74 y.o. year old male who is a primary care patient of [name], DO. I reached out to [name] by phone today in response to a referral sent by [name]'s health plan.     [name] was given information about Chronic Care Management services today including:  1. CCM service includes personalized support from designated clinical staff supervised by his physician, including individualized plan of care and coordination with other care providers 2. 24/7 contact phone numbers for assistance for urgent and routine care needs. 3. Service will only be billed when office clinical staff spend 20 minutes or more in a month to coordinate care. 4. Only one practitioner may furnish and bill the service in a calendar month. 5. The patient may stop CCM services at any time (effective at the end of the month) by phone call to the office staff. 6. The patient will be responsible for cost sharing (co-pay) of up to 20% of the service fee (after annual deductible is met).  Patient agreed to services and verbal consent obtained.   Follow up plan: Telephone appointment with care management team member scheduled for: 08/13/2020  Noreene Larsson, [place], [place], [place] [address] Direct Dial: [phone] [email or web address]: [email or web address]

## 2020-03-27 ENCOUNTER — Other Ambulatory Visit (HOSPITAL_COMMUNITY)
Admission: RE | Admit: 2020-03-27 | Discharge: 2020-03-27 | Disposition: A | Discharge status: Home / Self Care | Payer: Medicare Other | Source: Ambulatory Visit | Attending: Student | Admitting: Student

## 2020-03-27 ENCOUNTER — Encounter (HOSPITAL_COMMUNITY): Payer: Self-pay

## 2020-03-27 ENCOUNTER — Ambulatory Visit (HOSPITAL_COMMUNITY)
Admission: RE | Admit: 2020-03-27 | Discharge: 2020-03-27 | Disposition: A | Discharge status: Home / Self Care | Payer: Medicare Other | Source: Ambulatory Visit | Attending: Student | Admitting: Student

## 2020-03-27 ENCOUNTER — Encounter (HOSPITAL_COMMUNITY)
Admission: RE | Admit: 2020-03-27 | Discharge: 2020-03-27 | Disposition: A | Discharge status: Home / Self Care | Payer: Medicare Other | Source: Ambulatory Visit | Attending: Student | Admitting: Student

## 2020-03-27 ENCOUNTER — Other Ambulatory Visit: Payer: Self-pay

## 2020-03-27 DIAGNOSIS — I25119 Atherosclerotic heart disease of native coronary artery with unspecified angina pectoris: Secondary | ICD-10-CM | POA: Insufficient documentation

## 2020-03-27 DIAGNOSIS — R079 Chest pain, unspecified: Secondary | ICD-10-CM | POA: Diagnosis not present

## 2020-03-27 DIAGNOSIS — R9431 Abnormal electrocardiogram [ECG] [EKG]: Secondary | ICD-10-CM | POA: Insufficient documentation

## 2020-03-27 HISTORY — DX: Essential (primary) hypertension: I10

## 2020-03-27 LAB — COMPREHENSIVE METABOLIC PANEL
ALT: 15 U/L (ref 0–44)
AST: 17 U/L (ref 15–41)
Albumin: 3.8 g/dL (ref 3.5–5.0)
Alkaline Phosphatase: 42 U/L (ref 38–126)
Anion gap: 9 (ref 5–15)
BUN: 11 mg/dL (ref 8–23)
CO2: 28 mmol/L (ref 22–32)
Calcium: 9.6 mg/dL (ref 8.9–10.3)
Chloride: 104 mmol/L (ref 98–111)
Creatinine, Ser: 0.78 mg/dL (ref 0.61–1.24)
GFR calc Af Amer: >60 mL/min (ref >60)
GFR calc non Af Amer: >60 mL/min (ref >60)
Glucose, Bld: 101 mg/dL — ABNORMAL HIGH (ref 70–99)
Potassium: 3.8 mmol/L (ref 3.5–5.1)
Sodium: 141 mmol/L (ref 135–145)
Total Bilirubin: 0.7 mg/dL (ref 0.3–1.2)
Total Protein: 6.7 g/dL (ref 6.5–8.1)

## 2020-03-27 LAB — LIPID PANEL
Cholesterol: 154 mg/dL (ref 0–200)
HDL: 30 mg/dL — ABNORMAL LOW (ref >40)
LDL Cholesterol: 97 mg/dL (ref 0–99)
Total CHOL/HDL Ratio: 5.1 RATIO
Triglycerides: 135 mg/dL (ref <150)
VLDL: 27 mg/dL (ref 0–40)

## 2020-03-27 LAB — NM MYOCAR MULTI W/SPECT W/WALL MOTION / EF
LV dias vol: 135 mL (ref 62–150)
LV sys vol: 74 mL
Peak HR: 77 {beats}/min
RATE: 0.47
Rest HR: 54 {beats}/min
SDS: 3
SRS: 19
SSS: 22
TID: 0.92

## 2020-03-27 MED ORDER — REGADENOSON 0.4 MG/5ML IV SOLN
INTRAVENOUS | Status: AC
  Administered 2020-03-27: 0.4 mg via INTRAVENOUS
  Filled 2020-03-27: qty 5

## 2020-03-27 MED ORDER — SODIUM CHLORIDE FLUSH 0.9 % IV SOLN
INTRAVENOUS | Status: AC
  Administered 2020-03-27: 10 mL via INTRAVENOUS
  Filled 2020-03-27: qty 10

## 2020-03-27 MED ORDER — TECHNETIUM TC 99M TETROFOSMIN IV KIT
30.0000 | PACK | Freq: Once | INTRAVENOUS | Status: AC | PRN
  Administered 2020-03-27: 30.6 via INTRAVENOUS

## 2020-03-27 MED ORDER — TECHNETIUM TC 99M TETROFOSMIN IV KIT
10.0000 | PACK | Freq: Once | INTRAVENOUS | Status: AC | PRN
  Administered 2020-03-27: 11 via INTRAVENOUS

## 2020-04-01 ENCOUNTER — Telehealth: Payer: Self-pay | Admitting: *Deleted

## 2020-04-01 DIAGNOSIS — E785 Hyperlipidemia, unspecified: Secondary | ICD-10-CM

## 2020-04-01 NOTE — Telephone Encounter (Signed)
-----   Message from Erma Heritage, [place] sent at 03/27/2020  3:33 PM EDT ----- Reviewed lab work with the patient at the time of his stress test.  LDL has increased over the past 2 years and is now above goal of less than 70.  He does report dietary indiscretion and wishes to focus on dietary modifications initially.  Would therefore recheck FLP in 3 months.  If not at goal, would further titrate Simvastatin to 40 mg daily or switch to Crestor 20 mg daily.  Kisha, please mail him a lab slip for FLP in 3 months.

## 2020-04-01 NOTE — Telephone Encounter (Signed)
Order placed

## 2020-04-02 ENCOUNTER — Ambulatory Visit: Payer: Medicare Other

## 2020-04-02 ENCOUNTER — Ambulatory Visit: Payer: Medicare Other | Attending: Internal Medicine

## 2020-04-02 DIAGNOSIS — Z23 Encounter for immunization: Secondary | ICD-10-CM

## 2020-04-02 NOTE — Progress Notes (Signed)
   Covid-19 Vaccination Clinic  Name:  [name]    MRN: [id number] DOB: Jan 22, 1946  04/02/2020  [name] was observed post Covid-19 immunization for 15 minutes without incident. He was provided with Vaccine Information Sheet and instruction to access the V-Safe system.   [name] was instructed to call 911 with any severe reactions post vaccine: Marland Kitchen Difficulty breathing  . Swelling of face and throat  . A fast heartbeat  . A bad rash all over body  . Dizziness and weakness   Immunizations Administered    Name Date Dose VIS Date Route   Moderna COVID-19 Vaccine 04/02/2020  8:51 AM 0.5 mL 11/27/2019 Intramuscular   Manufacturer: Levan Hurst   LotUD:[id number]   [id number]

## 2020-04-19 ENCOUNTER — Other Ambulatory Visit: Payer: Self-pay | Admitting: Cardiology

## 2020-08-10 ENCOUNTER — Other Ambulatory Visit: Payer: Self-pay | Admitting: Family Medicine

## 2020-08-10 DIAGNOSIS — F325 Major depressive disorder, single episode, in full remission: Secondary | ICD-10-CM

## 2020-08-13 ENCOUNTER — Telehealth: Payer: Self-pay | Admitting: *Deleted

## 2020-08-13 ENCOUNTER — Ambulatory Visit (INDEPENDENT_AMBULATORY_CARE_PROVIDER_SITE_OTHER): Payer: Medicare Other | Admitting: *Deleted

## 2020-08-13 DIAGNOSIS — F325 Major depressive disorder, single episode, in full remission: Secondary | ICD-10-CM

## 2020-08-13 DIAGNOSIS — I10 Essential (primary) hypertension: Secondary | ICD-10-CM

## 2020-08-13 DIAGNOSIS — E785 Hyperlipidemia, unspecified: Secondary | ICD-10-CM

## 2020-08-13 DIAGNOSIS — Z8673 Personal history of transient ischemic attack (TIA), and cerebral infarction without residual deficits: Secondary | ICD-10-CM

## 2020-08-13 NOTE — Chronic Care Management (AMB) (Signed)
Chronic Care Management   Initial Visit Note  08/13/2020 Name: [name] MRN: [id number] DOB: July 19, 1946  Referred by: [name] Reason for referral : Chronic Care Management (Initial Visit)   [name] is a 74 y.o. year old male who is a primary care patient of [name]. The CCM team was consulted for assistance with chronic disease management and care coordination needs related to CVA, HTN, PVD, CAD, HLD, depression.  Review of patient status, including review of consultants reports, relevant laboratory and other test results, and collaboration with appropriate care team members and the patient's provider was performed as part of comprehensive patient evaluation and provision of chronic care management services.    Subjective:  I spoke with [name] and his wife by telephone today regarding management of his chronic medical conditions. Overall they feel that he is managing his medical conditions well but one area of concern is his hearing and they are interested in a hearing evaluation.   SDOH (Social Determinants of Health) assessments performed: Yes See Care Plan activities for detailed interventions related to SDOH  SDOH Interventions     Most Recent Value  SDOH Interventions  Physical Activity Interventions Other (Comments)  [encouraged walking or other physical activity for 30 minutes a day]       Objective: Outpatient Encounter Medications as of 08/13/2020  Medication Sig  . aspirin 81 MG tablet Take 81 mg by mouth daily.    . chlorthalidone (HYGROTON) 25 MG tablet Take 1 tablet by mouth once daily  . citalopram (CELEXA) 20 MG tablet Take 1 tablet (20 mg total) by mouth daily. (Needs to be seen before next refill)  . nitroGLYCERIN (NITROSTAT) 0.4 MG SL tablet Place 1 tablet (0.4 mg total) under the tongue every 5 (five) minutes as needed for chest pain.  . simvastatin (ZOCOR) 20 MG tablet Take 1 tablet (20 mg total) by mouth daily at 6 PM.  .  vitamin C (ASCORBIC ACID) 500 MG tablet Take 500 mg by mouth daily.   No facility-administered encounter medications on file as of 08/13/2020.     BP Readings from Last 3 Encounters:  03/13/20 118/60  10/10/19 120/80  01/31/19 131/81   Lab Results  Component Value Date   CHOL 154 03/27/2020   HDL 30 (L) 03/27/2020   LDLCALC 97 03/27/2020   TRIG 135 03/27/2020   CHOLHDL 5.1 03/27/2020     RN Care Plan            This Visit's Progress   . Chronic Disease Management Needs       CARE PLAN ENTRY (see longtitudinal plan of care for additional care plan information)  Current Barriers:  . Chronic Disease Management support, education, and care coordination needs related to CVA, HTN, PVD, CAD, HLD, depression  Clinical Goal(s) related to CVA, HTN, PVD, CAD, HLD, depression:  Over the next 60 days, patient will:  . Work with the care management team to address educational, disease management, and care coordination needs  . Begin or continue self health monitoring activities as directed today Measure and record blood pressure 3 times per week . Call provider office for new or worsened signs and symptoms Blood pressure findings outside established parameters . Call care management team with questions or concerns . Verbalize basic understanding of patient centered plan of care established today  Interventions related to CVA, HTN, PVD, CAD, HLD, depression:  . Evaluation of current treatment plans and patient's adherence to  plan as established by provider . Assessed patient understanding of disease states . Assessed patient's education and care coordination needs . Provided disease specific education to patient  . Collaborated with appropriate clinical care team members regarding patient needs . Chart reviewed including recent office notes and lab results . Medications reviewed . Discussed hearing loss and interest in hearing evaluation . RNCM will collaborate with PCP regarding  referral for hearing evaluation . Encouraged to reach out to [facility] as needed  Patient Self Care Activities related to CVA, HTN, PVD, CAD, HLD, depression:  . Patient is unable to independently self-manage chronic health conditions  Initial goal documentation         Plan:   The care management team will reach out to the patient again over the next 45 days.   [name] Embedded Chronic Care Manager [facility] Direct Dial: [phone]

## 2020-08-13 NOTE — Telephone Encounter (Signed)
08/13/2020  I spoke with Mr Slaymaker and his wife today for an initial Chronic Care Management Visit. He has some hearing loss and they are interested in a hearing evaluation. Preferably at the specialty clinic here in [place], if his insurance participates with them. He has not had a hearing evaluation before.   Mr Gens is also due for a routine follow-up with Dr Lajuana Ripple.   Forwarding to CCM clinical staff for review and action.   Chong Sicilian, BSN, RN-BC Embedded Chronic Care Manager [facility] Direct Dial: [phone]

## 2020-08-13 NOTE — Patient Instructions (Signed)
Visit Information  Goals Addressed            This Visit's Progress   . Chronic Disease Management Needs       CARE PLAN ENTRY (see longtitudinal plan of care for additional care plan information)  Current Barriers:  . Chronic Disease Management support, education, and care coordination needs related to CVA, HTN, PVD, CAD, HLD, depression  Clinical Goal(s) related to CVA, HTN, PVD, CAD, HLD, depression:  Over the next 60 days, patient will:  . Work with the care management team to address educational, disease management, and care coordination needs  . Begin or continue self health monitoring activities as directed today Measure and record blood pressure 3 times per week . Call provider office for new or worsened signs and symptoms Blood pressure findings outside established parameters . Call care management team with questions or concerns . Verbalize basic understanding of patient centered plan of care established today  Interventions related to CVA, HTN, PVD, CAD, HLD, depression:  . Evaluation of current treatment plans and patient's adherence to plan as established by provider . Assessed patient understanding of disease states . Assessed patient's education and care coordination needs . Provided disease specific education to patient  . Collaborated with appropriate clinical care team members regarding patient needs . Chart reviewed including recent office notes and lab results . Medications reviewed . Discussed hearing loss and interest in hearing evaluation . RNCM will collaborate with PCP regarding referral for hearing evaluation . Encouraged to reach out to [facility] as needed  Patient Self Care Activities related to CVA, HTN, PVD, CAD, HLD, depression:  . Patient is unable to independently self-manage chronic health conditions  Initial goal documentation        Patient verbalizes understanding of instructions provided today.   Follow-up Plan The care management team  will reach out to the patient again over the next 45 days.   Chong Sicilian, BSN, RN-BC Embedded Chronic Care Manager [facility] [place] [facility] Direct Dial: [phone]

## 2020-08-18 DIAGNOSIS — D2239 Melanocytic nevi of other parts of face: Secondary | ICD-10-CM | POA: Diagnosis not present

## 2020-08-18 DIAGNOSIS — L570 Actinic keratosis: Secondary | ICD-10-CM | POA: Diagnosis not present

## 2020-08-18 DIAGNOSIS — L820 Inflamed seborrheic keratosis: Secondary | ICD-10-CM | POA: Diagnosis not present

## 2020-08-18 DIAGNOSIS — X32XXXD Exposure to sunlight, subsequent encounter: Secondary | ICD-10-CM | POA: Diagnosis not present

## 2020-08-18 DIAGNOSIS — D2339 Other benign neoplasm of skin of other parts of face: Secondary | ICD-10-CM | POA: Diagnosis not present

## 2020-08-18 DIAGNOSIS — L905 Scar conditions and fibrosis of skin: Secondary | ICD-10-CM | POA: Diagnosis not present

## 2020-08-18 DIAGNOSIS — C4441 Basal cell carcinoma of skin of scalp and neck: Secondary | ICD-10-CM | POA: Diagnosis not present

## 2020-09-09 DIAGNOSIS — I255 Ischemic cardiomyopathy: Secondary | ICD-10-CM | POA: Insufficient documentation

## 2020-09-09 DIAGNOSIS — Z7189 Other specified counseling: Secondary | ICD-10-CM | POA: Insufficient documentation

## 2020-09-09 NOTE — Progress Notes (Addendum)
Cardiology Office Note   Date:  09/10/2020   ID:  [name] 1946/08/03, MRN [id number]  PCP:  [name], DO  Cardiologist:   Minus Breeding, MD   Chief Complaint  Patient presents with   Coronary Artery Disease      History of Present Illness: [name] is a 74 y.o. male who presents for follow up of CAD.  He had CABG in July 2010 with a mildly reduced EF.  Since I last saw him he was seen because of chest pain in March.   There was evidence of his prior MI but was similar to previous studies.  He was managed medically.    Since I last saw him he has done well.  The patient denies any new symptoms such as chest discomfort, neck or arm discomfort. There has been no new shortness of breath, PND or orthopnea. There have been no reported palpitations, presyncope or syncope.   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       Past Medical History:  Diagnosis Date   Acid reflux    Carotid artery occlusion    right carotid stenting in 2007   Chest pain    Coronary artery disease    a. 2010 --> cath showing LM and 3-vessel CAD --> s/p CABG with LIMA-LAD, SVG-D1, SVG-OM2 and SVD-PDA   CVA (cerebral vascular accident) ([place])    Hyperlipidemia    Hypertension    Obesity     Past Surgical History:  Procedure Laterality Date   CORONARY ARTERY BYPASS GRAFT     TONSILLECTOMY       Current Outpatient Medications  Medication Sig Dispense Refill   aspirin 81 MG tablet Take 81 mg by mouth daily.       chlorthalidone (HYGROTON) 25 MG tablet Take 1 tablet by mouth once daily 90 tablet 2   citalopram (CELEXA) 20 MG tablet Take 1 tablet (20 mg total) by mouth daily. (Needs to be seen before next refill) 30 tablet 0   nitroGLYCERIN (NITROSTAT) 0.4 MG SL tablet Place 1 tablet (0.4 mg total) under the tongue every 5 (five) minutes as needed for chest pain. 25 tablet 3   vitamin C (ASCORBIC ACID) 500 MG tablet Take 500 mg by mouth daily.     atorvastatin  (LIPITOR) 40 MG tablet Take 1 tablet (40 mg total) by mouth daily. 90 tablet 3   No current facility-administered medications for this visit.    Allergies:   Atorvastatin, Crestor [rosuvastatin calcium], Iodinated diagnostic agents, and Niaspan [niacin er]    ROS:  Please see the history of present illness.   Otherwise, review of systems are positive for none.   All other systems are reviewed and negative.    PHYSICAL EXAM: VS:  BP 130/68    Pulse 76    Ht 5\' 10"  (1.778 m)    Wt 226 lb (102.5 kg)    BMI 32.43 kg/m  , BMI Body mass index is 32.43 kg/m. GENERAL:  Well appearing NECK:  No jugular venous distention, waveform within normal limits, carotid upstroke brisk and symmetric, no bruits, no thyromegaly LUNGS:  Clear to auscultation bilaterally CHEST:  Well healed sternotomy scar. HEART:  PMI not displaced or sustained,S1 and S2 within normal limits, no S3, no S4, no clicks, no rubs, no murmurs ABD:  Flat, positive bowel sounds normal in frequency in pitch, no bruits, no rebound, no guarding, no midline pulsatile mass, no hepatomegaly, no  splenomegaly EXT:  2 plus pulses upper and decreased dorsalis pedis and posterior tibialis bilateral lower, no edema, no cyanosis no clubbing   EKG:  EKG is not ordered today.   Recent Labs: 03/27/2020: ALT 15; BUN 11; Creatinine, Ser 0.78; Potassium 3.8; Sodium 141    Lipid Panel    Component Value Date/Time   CHOL 154 03/27/2020 0817   CHOL 139 03/02/2018 1216   CHOL 120 05/25/2013 0828   TRIG 135 03/27/2020 0817   TRIG 163 (H) 09/10/2013 0854   TRIG 194 (H) 05/25/2013 0828   HDL 30 (L) 03/27/2020 0817   HDL 35 (L) 03/02/2018 1216   HDL 37 (L) 09/10/2013 0854   HDL 32 (L) 05/25/2013 0828   CHOLHDL 5.1 03/27/2020 0817   VLDL 27 03/27/2020 0817   LDLCALC 97 03/27/2020 0817   LDLCALC 65 03/02/2018 1216   LDLCALC 57 09/10/2013 0854   LDLCALC 49 05/25/2013 0828      Wt Readings from Last 3 Encounters:  09/10/20 226 lb (102.5 kg)    03/13/20 224 lb (101.6 kg)  10/10/19 224 lb (101.6 kg)      Other studies Reviewed: Additional studies/ records that were reviewed today include: Labs. Review of the above records demonstrates:  Please see elsewhere in the note.     ASSESSMENT AND PLAN:  HTN -    His blood pressure is controlled.  No change in therapy.  CAD, AUTOLOGOUS BYPASS GRAFT -  He had a negative Lexiscan Myoview in early April.  He has had no new symptoms.  No change in therapy.  HYPERLIPIDEMIA-MIXED -  LDL was 97 in April.  He is not at target.  I am going to stop his Zocor.  I will start Lipitor 40 mg daily and check lipid profile liver enzymes in 10 weeks.   He reported that he did have muscle aches in the past with Lipitor but is willing to try it again.   CVA -  He had less than 50% stenosis bilaterally last year. He should have follow up next month.  I will arrange this.  OBESITY -  He understands the need to lose weight with diet and exercise.   Cardiomyopathy -  He has a mildly reduced ejection fraction on echo in Nov 2019.  This was unchanged from 2010.  No change in therapy.   PVD He has no symptoms of claudication.  He does have reduced pulses.  He has an abdominal bruit and I will check an abdominal ultrasound for aneurysm.   The patient also is a previous smoker and need to screening per Medicare guidelines.  COVID EDUCATION - He has been vaccinated.   Current medicines are reviewed at length with the patient today.  The patient does not have concerns regarding medicines.  The following changes have been made: As above  Labs/ tests ordered today include:   Orders Placed This Encounter  Procedures   US Carotid Bilateral   US AORTA   Hepatic function panel   Lipid panel     Disposition:   FU with 12 months   Signed, Minus Breeding, MD  09/10/2020 10:03 AM    [place]

## 2020-09-10 ENCOUNTER — Ambulatory Visit (INDEPENDENT_AMBULATORY_CARE_PROVIDER_SITE_OTHER): Payer: Medicare Other | Admitting: Cardiology

## 2020-09-10 ENCOUNTER — Encounter: Payer: Self-pay | Admitting: Cardiology

## 2020-09-10 ENCOUNTER — Ambulatory Visit: Payer: Medicare Other | Admitting: Cardiology

## 2020-09-10 ENCOUNTER — Other Ambulatory Visit: Payer: Self-pay

## 2020-09-10 VITALS — BP 130/68 | HR 76 | Ht 70.0 in | Wt 226.0 lb

## 2020-09-10 DIAGNOSIS — I2510 Atherosclerotic heart disease of native coronary artery without angina pectoris: Secondary | ICD-10-CM

## 2020-09-10 DIAGNOSIS — I255 Ischemic cardiomyopathy: Secondary | ICD-10-CM

## 2020-09-10 DIAGNOSIS — I6523 Occlusion and stenosis of bilateral carotid arteries: Secondary | ICD-10-CM

## 2020-09-10 DIAGNOSIS — Z7189 Other specified counseling: Secondary | ICD-10-CM | POA: Diagnosis not present

## 2020-09-10 DIAGNOSIS — E785 Hyperlipidemia, unspecified: Secondary | ICD-10-CM

## 2020-09-10 DIAGNOSIS — R0989 Other specified symptoms and signs involving the circulatory and respiratory systems: Secondary | ICD-10-CM

## 2020-09-10 DIAGNOSIS — Z79899 Other long term (current) drug therapy: Secondary | ICD-10-CM

## 2020-09-10 MED ORDER — ATORVASTATIN CALCIUM 40 MG PO TABS: 40.0000 mg | ORAL_TABLET | Freq: Every day | ORAL | 3 refills | Status: DC

## 2020-09-10 NOTE — Patient Instructions (Signed)
Medication Instructions:  Please discontinue your Simvastatin and start Atorvastatin 40 mg a day.  Continue all other medications as listed.  *If you need a refill on your cardiac medications before your next appointment, please call your pharmacy*  Lab Work: Please have fasting lab in 10 weeks (Lipid/Liver) at [facility].  Due around 11/19/2020.  If you have labs (blood work) drawn today and your tests are completely normal, you will receive your results only by: Marland Kitchen MyChart Message (if you have MyChart) OR . A paper copy in the mail If you have any lab test that is abnormal or we need to change your treatment, we will call you to review the results.   Testing/Procedures: Your physician has requested that you have a carotid duplex. This test is an ultrasound of the carotid arteries in your neck. It looks at blood flow through these arteries that supply the brain with blood. Allow one hour for this exam. There are no restrictions or special instructions.  Your physician has requested that you have an abdominal aorta duplex. During this test, an ultrasound is used to evaluate the aorta. Allow 30 minutes for this exam. Do not eat after midnight the day before and avoid carbonated beverages.  This will be completed at [place] in at the [facility] then report to radiology.  Follow-Up: At [facility], you and your health needs are our priority.  As part of our continuing mission to provide you with exceptional heart care, we have created designated Provider Care Teams.  These Care Teams include your primary Cardiologist (physician) and Advanced Practice Providers (APPs -  Physician Assistants and Nurse Practitioners) who all work together to provide you with the care you need, when you need it.  We recommend signing up for the patient portal called "MyChart".  Sign up information is provided on this After Visit Summary.  MyChart is used to connect with patients for Virtual Visits  (Telemedicine).  Patients are able to view lab/test results, encounter notes, upcoming appointments, etc.  Non-urgent messages can be sent to your provider as well.   To learn more about what you can do with MyChart, go to [email or web address].    Your next appointment:   12 month(s)  The format for your next appointment:   In Person  Provider:   Minus Breeding, MD   Thank you for choosing [facility]!!

## 2020-09-16 ENCOUNTER — Telehealth: Payer: Self-pay | Admitting: Cardiology

## 2020-09-16 NOTE — Telephone Encounter (Signed)
Pre-cert Verification for the following procedure    US AORTA US CARTOID   DATE:  09/22/2020  LOCATION: [place]

## 2020-09-22 ENCOUNTER — Other Ambulatory Visit: Payer: Self-pay

## 2020-09-22 ENCOUNTER — Ambulatory Visit (HOSPITAL_COMMUNITY)
Admission: RE | Admit: 2020-09-22 | Discharge: 2020-09-22 | Disposition: A | Discharge status: Home / Self Care | Payer: Medicare Other | Source: Ambulatory Visit | Attending: Cardiology | Admitting: Cardiology

## 2020-09-22 DIAGNOSIS — Z87891 Personal history of nicotine dependence: Secondary | ICD-10-CM | POA: Insufficient documentation

## 2020-09-22 DIAGNOSIS — Z136 Encounter for screening for cardiovascular disorders: Secondary | ICD-10-CM | POA: Diagnosis not present

## 2020-09-22 DIAGNOSIS — R0989 Other specified symptoms and signs involving the circulatory and respiratory systems: Secondary | ICD-10-CM | POA: Diagnosis not present

## 2020-09-22 DIAGNOSIS — I6523 Occlusion and stenosis of bilateral carotid arteries: Secondary | ICD-10-CM | POA: Insufficient documentation

## 2020-09-22 DIAGNOSIS — I739 Peripheral vascular disease, unspecified: Secondary | ICD-10-CM | POA: Insufficient documentation

## 2020-09-22 DIAGNOSIS — I2510 Atherosclerotic heart disease of native coronary artery without angina pectoris: Secondary | ICD-10-CM | POA: Insufficient documentation

## 2020-09-22 DIAGNOSIS — I6522 Occlusion and stenosis of left carotid artery: Secondary | ICD-10-CM | POA: Diagnosis not present

## 2020-10-05 ENCOUNTER — Other Ambulatory Visit: Payer: Self-pay | Admitting: Family Medicine

## 2020-10-05 DIAGNOSIS — F325 Major depressive disorder, single episode, in full remission: Secondary | ICD-10-CM

## 2020-10-29 ENCOUNTER — Other Ambulatory Visit: Payer: Self-pay | Admitting: Family Medicine

## 2020-10-29 DIAGNOSIS — F325 Major depressive disorder, single episode, in full remission: Secondary | ICD-10-CM

## 2020-10-29 NOTE — Telephone Encounter (Signed)
[place]. NTBS 30 days given 10/06/20

## 2020-10-29 NOTE — Telephone Encounter (Signed)
Patient will call back to schedule appointment.

## 2020-11-01 ENCOUNTER — Other Ambulatory Visit: Payer: Self-pay | Admitting: Family Medicine

## 2020-11-01 DIAGNOSIS — F325 Major depressive disorder, single episode, in full remission: Secondary | ICD-10-CM

## 2020-11-03 ENCOUNTER — Other Ambulatory Visit: Payer: Self-pay | Admitting: Family Medicine

## 2020-11-03 DIAGNOSIS — F325 Major depressive disorder, single episode, in full remission: Secondary | ICD-10-CM

## 2020-11-03 NOTE — Telephone Encounter (Signed)
  Prescription Request  11/03/2020  What is the name of the medication or equipment? Citalopran 20 mg Patient has appt 11-29 and needs enough to get him through  Have you contacted your pharmacy to request a refill? (if applicable) Yes  Which pharmacy would you like this sent to? [facility] in [place]   Patient notified that their request is being sent to the clinical staff for review and that they should receive a response within 2 business days.

## 2020-11-04 NOTE — Telephone Encounter (Signed)
Refill sent to pharmacy while patient was talking to switchboard operator

## 2020-11-24 ENCOUNTER — Ambulatory Visit (INDEPENDENT_AMBULATORY_CARE_PROVIDER_SITE_OTHER): Payer: Medicare Other | Admitting: Family Medicine

## 2020-11-24 ENCOUNTER — Other Ambulatory Visit: Payer: Self-pay

## 2020-11-24 ENCOUNTER — Encounter: Payer: Self-pay | Admitting: Family Medicine

## 2020-11-24 VITALS — BP 108/64 | HR 73 | Temp 97.5°F | Ht 70.0 in | Wt 228.0 lb

## 2020-11-24 DIAGNOSIS — Z23 Encounter for immunization: Secondary | ICD-10-CM

## 2020-11-24 DIAGNOSIS — E785 Hyperlipidemia, unspecified: Secondary | ICD-10-CM

## 2020-11-24 DIAGNOSIS — Z0001 Encounter for general adult medical examination with abnormal findings: Secondary | ICD-10-CM | POA: Diagnosis not present

## 2020-11-24 DIAGNOSIS — I25810 Atherosclerosis of coronary artery bypass graft(s) without angina pectoris: Secondary | ICD-10-CM | POA: Diagnosis not present

## 2020-11-24 DIAGNOSIS — N400 Enlarged prostate without lower urinary tract symptoms: Secondary | ICD-10-CM

## 2020-11-24 DIAGNOSIS — F325 Major depressive disorder, single episode, in full remission: Secondary | ICD-10-CM

## 2020-11-24 DIAGNOSIS — Z0000 Encounter for general adult medical examination without abnormal findings: Secondary | ICD-10-CM

## 2020-11-24 MED ORDER — NITROGLYCERIN 0.4 MG SL SUBL: 0.4000 mg | SUBLINGUAL_TABLET | SUBLINGUAL | 3 refills | Status: DC | PRN

## 2020-11-24 MED ORDER — CITALOPRAM HYDROBROMIDE 20 MG PO TABS: 20.0000 mg | ORAL_TABLET | Freq: Every day | ORAL | 3 refills | Status: DC

## 2020-11-24 NOTE — Progress Notes (Signed)
[name] is a 74 y.o. male presents to office today for annual physical exam examination.    Concerns today include: 1.  None  Occupation: Retired, Marital status: Married, Substance use: Occasional alcohol Diet: Fair.  Try and incorporate more fiber, Exercise: No structured Last eye exam: Up-to-date Last dental exam: Has false teeth so does not see dentist Last colonoscopy: Up-to-date  Past Medical History:  Diagnosis Date  . Acid reflux   . Carotid artery occlusion    right carotid stenting in 2007  . Chest pain   . Coronary artery disease    a. 2010 --> cath showing LM and 3-vessel CAD --> s/p CABG with LIMA-LAD, SVG-D1, SVG-OM2 and SVD-PDA  . CVA (cerebral vascular accident) ([place])   . Hyperlipidemia   . Hypertension   . Obesity    Social History   Socioeconomic History  . Marital status: Married    Spouse name: [name]  . Number of children: 2  . Years of education: Not on file  . Highest education level: Not on file  Occupational History  . Occupation: Retired  Tobacco Use  . Smoking status: Former Smoker    Packs/day: 1.50    Years: 41.00    Pack years: 61.50    Types: Cigarettes    Start date: 02/04/1965    Quit date: 12/27/2005    Years since quitting: 14.9  . Smokeless tobacco: Never Used  Vaping Use  . Vaping Use: Never used  Substance and Sexual Activity  . Alcohol use: No    Alcohol/week: 0.0 standard drinks  . Drug use: No  . Sexual activity: Not Currently  Other Topics Concern  . Not on file  Social History Narrative  . Not on file   Social Determinants of Health   Financial Resource Strain: Low Risk   . Difficulty of Paying Living Expenses: Not hard at all  Food Insecurity: No Food Insecurity  . Worried About Charity fundraiser in the Last Year: Never true  . Ran Out of Food in the Last Year: Never true  Transportation Needs: No Transportation Needs  . Lack of Transportation (Medical): No  . Lack of Transportation (Non-Medical): No    Physical Activity: Inactive  . Days of Exercise per Week: 0 days  . Minutes of Exercise per Session: 0 min  Stress: No Stress Concern Present  . Feeling of Stress : Not at all  Social Connections: Socially Integrated  . Frequency of Communication with Friends and Family: More than three times a week  . Frequency of Social Gatherings with Friends and Family: More than three times a week  . Attends Religious Services: More than 4 times per year  . Active Member of Clubs or Organizations: Yes  . Attends Archivist Meetings: More than 4 times per year  . Marital Status: Married  Human resources officer Violence: Not At Risk  . Fear of Current or Ex-Partner: No  . Emotionally Abused: No  . Physically Abused: No  . Sexually Abused: No   Past Surgical History:  Procedure Laterality Date  . CORONARY ARTERY BYPASS GRAFT    . TONSILLECTOMY     Family History  Problem Relation Age of Onset  . Heart attack Father   . Heart attack Brother     Current Outpatient Medications:  .  aspirin 81 MG tablet, Take 81 mg by mouth daily.  , Disp: , Rfl:  .  atorvastatin (LIPITOR) 40 MG tablet, Take 1 tablet (40  mg total) by mouth daily., Disp: 90 tablet, Rfl: 3 .  chlorthalidone (HYGROTON) 25 MG tablet, Take 1 tablet by mouth once daily, Disp: 90 tablet, Rfl: 2 .  citalopram (CELEXA) 20 MG tablet, Take 1 tablet (20 mg total) by mouth daily., Disp: 30 tablet, Rfl: 0 .  nitroGLYCERIN (NITROSTAT) 0.4 MG SL tablet, Place 1 tablet (0.4 mg total) under the tongue every 5 (five) minutes as needed for chest pain., Disp: 25 tablet, Rfl: 3 .  vitamin C (ASCORBIC ACID) 500 MG tablet, Take 500 mg by mouth daily., Disp: , Rfl:   Allergies  Allergen Reactions  . Atorvastatin Other (See Comments)    Muscle aches  . Crestor [Rosuvastatin Calcium] Other (See Comments)    Muscle aches  . Iodinated Diagnostic Agents Rash    rash  . Niaspan [Niacin Er] Other (See Comments)    Muscle aches     ROS: Review  of Systems A comprehensive review of systems was negative except for: Eyes: positive for contacts/glasses Gastrointestinal: positive for constipation Integument/breast: positive for Previous skin lesions that required resection by dermatology.  He has scar formation    Physical exam BP 108/64   Pulse 73   Temp (!) 97.5 F (36.4 C) (Temporal)   Ht '5\' 10"'  (1.778 m)   Wt 228 lb (103.4 kg)   SpO2 94%   BMI 32.71 kg/m  General appearance: alert, cooperative, appears stated age and no distress Head: Normocephalic, without obvious abnormality, atraumatic Eyes: negative findings: lids and lashes normal, conjunctivae and sclerae normal, corneas clear and pupils equal, round, reactive to light and accomodation Ears: normal TM's and external ear canals both ears Nose: Nares normal. Septum midline. Mucosa normal. No drainage or sinus tenderness. Throat: MMM. Neck: no adenopathy, no carotid bruit, supple, symmetrical, trachea midline and thyroid not enlarged, symmetric, no tenderness/mass/nodules Back: symmetric, no curvature. ROM normal. No CVA tenderness. Lungs: clear to auscultation bilaterally Chest wall: no tenderness Heart: regular rate and rhythm, S1, S2 normal, no murmur, click, rub or gallop Abdomen: soft, non-tender; bowel sounds normal; no masses,  no organomegaly Extremities: extremities normal, atraumatic, no cyanosis or edema Pulses: 2+ and symmetric Skin: He has some hypopigmentation areas of previous skin treatments Lymph nodes: Cervical, supraclavicular, and axillary nodes normal. Neurologic: Alert and oriented X 3, normal strength and tone. Normal symmetric reflexes. Normal coordination and gait Psych mood stable, speech normal, affect appropriate.  Patient is pleasant and interactive Depression screen [facility] 2/9 11/24/2020 08/13/2020 08/10/2019 01/31/2019 07/31/2018  Decreased Interest 0 0 0 0 0  Down, Depressed, Hopeless 0 0 0 0 0  PHQ - 2 Score 0 0 0 0 0  Altered sleeping 0 - 0 0  -  Tired, decreased energy 0 - 0 0 -  Change in appetite 0 - 0 0 -  Feeling bad or failure about yourself  0 - 0 0 -  Trouble concentrating 0 - 0 0 -  Moving slowly or fidgety/restless 0 - 0 0 -  Suicidal thoughts 0 - 0 0 -  PHQ-9 Score 0 - 0 0 -  Difficult doing work/chores - - Not difficult at all - -     Assessment/ Plan: [name] here for annual physical exam.   Annual physical exam  Coronary atherosclerosis of autologous vein bypass graft without angina - Plan: CMP14+EGFR, Lipid Panel, TSH, CBC  Dyslipidemia - Plan: CMP14+EGFR, Lipid Panel, TSH  Benign prostatic hyperplasia without lower urinary tract symptoms - Plan: PSA, CBC  Major  depressive disorder with single episode, in full remission ([place]) - Plan: citalopram (CELEXA) 20 MG tablet  Need for immunization against influenza - Plan: Flu Vaccine QUAD High Dose(Fluad)  Check fasting labs.  We will plan to [place] lab results to his cardiologist.  Counseled on healthy lifestyle choices, including diet (rich in fruits, vegetables and lean meats and low in salt and simple carbohydrates) and exercise (at least 30 minutes of moderate physical activity daily).  Patient to follow up in 1 year for annual exam or sooner if needed.  Tiran Sauseda M. Lajuana Ripple, DO

## 2020-11-24 NOTE — Patient Instructions (Signed)
You had labs performed today.  You will be contacted with the results of the labs once they are available, usually in the next 3 business days for routine lab work.  If you have an active my chart account, they will be released to your MyChart.  If you prefer to have these labs released to you via telephone, please let us know.  If you had a pap smear or biopsy performed, expect to be contacted in about 7-10 days.   Preventive Care 75 Years and Older, Male Preventive care refers to lifestyle choices and visits with your health care provider that can promote health and wellness. This includes:  A yearly physical exam. This is also called an annual well check.  Regular dental and eye exams.  Immunizations.  Screening for certain conditions.  Healthy lifestyle choices, such as diet and exercise. What can I expect for my preventive care visit? Physical exam Your health care provider will check:  Height and weight. These may be used to calculate body mass index (BMI), which is a measurement that tells if you are at a healthy weight.  Heart rate and blood pressure.  Your skin for abnormal spots. Counseling Your health care provider may ask you questions about:  Alcohol, tobacco, and drug use.  Emotional well-being.  Home and relationship well-being.  Sexual activity.  Eating habits.  History of falls.  Memory and ability to understand (cognition).  Work and work Statistician. What immunizations do I need?  Influenza (flu) vaccine  This is recommended every year. Tetanus, diphtheria, and pertussis (Tdap) vaccine  You may need a Td booster every 10 years. Varicella (chickenpox) vaccine  You may need this vaccine if you have not already been vaccinated. Zoster (shingles) vaccine  You may need this after age 42. Pneumococcal conjugate (PCV13) vaccine  One dose is recommended after age 72. Pneumococcal polysaccharide (PPSV23) vaccine  One dose is recommended after  age 43. Measles, mumps, and rubella (MMR) vaccine  You may need at least one dose of MMR if you were born in 1957 or later. You may also need a second dose. Meningococcal conjugate (MenACWY) vaccine  You may need this if you have certain conditions. Hepatitis A vaccine  You may need this if you have certain conditions or if you travel or work in places where you may be exposed to hepatitis A. Hepatitis B vaccine  You may need this if you have certain conditions or if you travel or work in places where you may be exposed to hepatitis B. Haemophilus influenzae type b (Hib) vaccine  You may need this if you have certain conditions. You may receive vaccines as individual doses or as more than one vaccine together in one shot (combination vaccines). Talk with your health care provider about the risks and benefits of combination vaccines. What tests do I need? Blood tests  Lipid and cholesterol levels. These may be checked every 5 years, or more frequently depending on your overall health.  Hepatitis C test.  Hepatitis B test. Screening  Lung cancer screening. You may have this screening every year starting at age 43 if you have a 30-pack-year history of smoking and currently smoke or have quit within the past 15 years.  Colorectal cancer screening. All adults should have this screening starting at age 24 and continuing until age 6. Your health care provider may recommend screening at age 54 if you are at increased risk. You will have tests every 1-10 years, depending on your results  and the type of screening test.  Prostate cancer screening. Recommendations will vary depending on your family history and other risks.  Diabetes screening. This is done by checking your blood sugar (glucose) after you have not eaten for a while (fasting). You may have this done every 1-3 years.  Abdominal aortic aneurysm (AAA) screening. You may need this if you are a current or former smoker.  Sexually  transmitted disease (STD) testing. Follow these instructions at home: Eating and drinking  Eat a diet that includes fresh fruits and vegetables, whole grains, lean protein, and low-fat dairy products. Limit your intake of foods with high amounts of sugar, saturated fats, and salt.  Take vitamin and mineral supplements as recommended by your health care provider.  Do not drink alcohol if your health care provider tells you not to drink.  If you drink alcohol: ? Limit how much you have to 0-2 drinks a day. ? Be aware of how much alcohol is in your drink. In the [place] one drink equals one 12 oz bottle of beer (355 mL), one 5 oz glass of wine (148 mL), or one 1 oz glass of hard liquor (44 mL). Lifestyle  Take daily care of your teeth and gums.  Stay active. Exercise for at least 30 minutes on 5 or more days each week.  Do not use any products that contain nicotine or tobacco, such as cigarettes, e-cigarettes, and chewing tobacco. If you need help quitting, ask your health care provider.  If you are sexually active, practice safe sex. Use a condom or other form of protection to prevent STIs (sexually transmitted infections).  Talk with your health care provider about taking a low-dose aspirin or statin. What's next?  Visit your health care provider once a year for a well check visit.  Ask your health care provider how often you should have your eyes and teeth checked.  Stay up to date on all vaccines. This information is not intended to replace advice given to you by your health care provider. Make sure you discuss any questions you have with your health care provider. Document Revised: 12/07/2018 Document Reviewed: 12/07/2018 Elsevier Patient Education  2020 [employer].

## 2020-11-25 LAB — CMP14+EGFR
ALT: 14 IU/L (ref 0–44)
AST: 17 IU/L (ref 0–40)
Albumin/Globulin Ratio: 2 (ref 1.2–2.2)
Albumin: 4.3 g/dL (ref 3.7–4.7)
Alkaline Phosphatase: 61 IU/L (ref 44–121)
BUN/Creatinine Ratio: 14 (ref 10–24)
BUN: 13 mg/dL (ref 8–27)
Bilirubin Total: 0.7 mg/dL (ref 0.0–1.2)
CO2: 26 mmol/L (ref 20–29)
Calcium: 9.3 mg/dL (ref 8.6–10.2)
Chloride: 99 mmol/L (ref 96–106)
Creatinine, Ser: 0.91 mg/dL (ref 0.76–1.27)
GFR calc Af Amer: 95 mL/min/{1.73_m2} (ref >59)
GFR calc non Af Amer: 82 mL/min/{1.73_m2} (ref >59)
Globulin, Total: 2.2 g/dL (ref 1.5–4.5)
Glucose: 86 mg/dL (ref 65–99)
Potassium: 4 mmol/L (ref 3.5–5.2)
Sodium: 140 mmol/L (ref 134–144)
Total Protein: 6.5 g/dL (ref 6.0–8.5)

## 2020-11-25 LAB — CBC
Hematocrit: 48.6 % (ref 37.5–51.0)
Hemoglobin: 16.5 g/dL (ref 13.0–17.7)
MCH: 30.7 pg (ref 26.6–33.0)
MCHC: 34 g/dL (ref 31.5–35.7)
MCV: 91 fL (ref 79–97)
Platelets: 220 10*3/uL (ref 150–450)
RBC: 5.37 x10E6/uL (ref 4.14–5.80)
RDW: 12.3 % (ref 11.6–15.4)
WBC: 9 10*3/uL (ref 3.4–10.8)

## 2020-11-25 LAB — PSA: Prostate Specific Ag, Serum: 0.8 ng/mL (ref 0.0–4.0)

## 2020-11-25 LAB — LIPID PANEL
Chol/HDL Ratio: 2.9 ratio (ref 0.0–5.0)
Cholesterol, Total: 105 mg/dL (ref 100–199)
HDL: 36 mg/dL — ABNORMAL LOW (ref >39)
LDL Chol Calc (NIH): 49 mg/dL (ref 0–99)
Triglycerides: 105 mg/dL (ref 0–149)
VLDL Cholesterol Cal: 20 mg/dL (ref 5–40)

## 2020-11-25 LAB — TSH: TSH: 2.1 u[IU]/mL (ref 0.450–4.500)

## 2020-11-27 ENCOUNTER — Other Ambulatory Visit: Payer: Self-pay | Admitting: Cardiology

## 2020-12-31 ENCOUNTER — Ambulatory Visit: Payer: Medicare Other

## 2021-01-28 ENCOUNTER — Telehealth: Payer: Medicare Other | Admitting: *Deleted

## 2021-06-22 DIAGNOSIS — H5203 Hypermetropia, bilateral: Secondary | ICD-10-CM | POA: Diagnosis not present

## 2021-06-26 ENCOUNTER — Other Ambulatory Visit: Payer: Self-pay | Admitting: Cardiology

## 2021-09-17 ENCOUNTER — Telehealth: Payer: Self-pay | Admitting: Family Medicine

## 2021-09-17 NOTE — Telephone Encounter (Signed)
Left message for patient to call back and schedule Medicare Annual Wellness Visit (AWV) by video or phone  Last AWV 05/02/2018  Please schedule at any time with [place].  45-minute appointment  Any questions, please contact me at [phone]

## 2021-10-11 NOTE — Progress Notes (Signed)
Cardiology Office Note   Date:  10/14/2021   ID:  [name], DOB 01-Apr-1946, MRN [id number]  PCP:  [name], DO  Cardiologist:   Minus Breeding, MD   Chief Complaint  Patient presents with   Coronary Artery Disease       History of Present Illness: [name] is a 75 y.o. male who presents for follow up of CAD.  He had CABG in July 2010 with a mildly reduced EF.  Since I last saw him he was seen because of chest pain in March.   There was evidence of his prior MI but was similar to previous studies.  He was managed medically.    Since I last saw him he has had one episode of chest pain.  This was about 3 weeks ago.  It went away resolved.  It only lasted for few minutes.  Burning discomfort.  He has had no associated symptoms.  Otherwise he denies any cardiovascular symptoms.  He is not as physically active as I would suggest doing a lot of computer work.  However, with activities of daily living he denies any cardiovascular symptoms.  He has had no new shortness of breath, PND or apnea.  He has had no palpitations, presyncope or syncope.  He   Past Medical History:  Diagnosis Date   Acid reflux    Carotid artery occlusion    right carotid stenting in 2007   Chest pain    Coronary artery disease    a. 2010 --> cath showing LM and 3-vessel CAD --> s/p CABG with LIMA-LAD, SVG-D1, SVG-OM2 and SVD-PDA   CVA (cerebral vascular accident) ([place])    Hyperlipidemia    Hypertension    Obesity     Past Surgical History:  Procedure Laterality Date   CORONARY ARTERY BYPASS GRAFT     TONSILLECTOMY       Current Outpatient Medications  Medication Sig Dispense Refill   aspirin 81 MG tablet Take 81 mg by mouth daily.       atorvastatin (LIPITOR) 40 MG tablet Take 1 tablet (40 mg total) by mouth daily. *Patient needs to attend upcoming appointment with Cardiology in order to receive future refills* 90 tablet 0   chlorthalidone (HYGROTON) 25 MG tablet Take 1 tablet by  mouth once daily 90 tablet 3   citalopram (CELEXA) 20 MG tablet Take 1 tablet (20 mg total) by mouth daily. 90 tablet 3   nitroGLYCERIN (NITROSTAT) 0.4 MG SL tablet Place 1 tablet (0.4 mg total) under the tongue every 5 (five) minutes as needed for chest pain. 25 tablet 3   vitamin C (ASCORBIC ACID) 500 MG tablet Take 500 mg by mouth daily. (Patient not taking: Reported on 10/14/2021)     No current facility-administered medications for this visit.    Allergies:   Atorvastatin, Crestor [rosuvastatin calcium], Iodinated diagnostic agents, and Niaspan [niacin er]    ROS:  Please see the history of present illness.   Otherwise, review of systems are positive for none.   All other systems are reviewed and negative.    PHYSICAL EXAM: VS:  BP 122/74   Pulse 71   Ht 5\' 10"  (1.778 m)   Wt 227 lb (103 kg)   BMI 32.57 kg/m  , BMI Body mass index is 32.57 kg/m. GENERAL:  Well appearing NECK:  No jugular venous distention, waveform within normal limits, carotid upstroke brisk and symmetric, no bruits, no thyromegaly LUNGS:  Clear to auscultation  bilaterally CHEST:  Well healed sternotomy scar. HEART:  PMI not displaced or sustained,S1 and S2 within normal limits, no S3, no S4, no clicks, no rubs, no murmurs ABD:  Flat, positive bowel sounds normal in frequency in pitch, no bruits, no rebound, no guarding, no midline pulsatile mass, no hepatomegaly, no splenomegaly EXT:  2 plus pulses upper and decreased dorsalis pedis and posterior tibialis bilateral lower, no edema, no cyanosis no clubbing   EKG:  EKG is  ordered today. Sinus rhythm, rate 71, axis within normal limits, possible old inferior infarct, old anteroseptal infarct, no acute ST-T wave changes, nonspecific inferior T wave changes unchanged from previous.  Recent Labs: 11/24/2020: ALT 14; BUN 13; Creatinine, Ser 0.91; Hemoglobin 16.5; Platelets 220; Potassium 4.0; Sodium 140; TSH 2.100    Lipid Panel    Component Value Date/Time    CHOL 105 11/24/2020 1057   CHOL 120 05/25/2013 0828   TRIG 105 11/24/2020 1057   TRIG 163 (H) 09/10/2013 0854   TRIG 194 (H) 05/25/2013 0828   HDL 36 (L) 11/24/2020 1057   HDL 37 (L) 09/10/2013 0854   HDL 32 (L) 05/25/2013 0828   CHOLHDL 2.9 11/24/2020 1057   CHOLHDL 5.1 03/27/2020 0817   VLDL 27 03/27/2020 0817   LDLCALC 49 11/24/2020 1057   LDLCALC 57 09/10/2013 0854   LDLCALC 49 05/25/2013 0828      Wt Readings from Last 3 Encounters:  10/14/21 227 lb (103 kg)  11/24/20 228 lb (103.4 kg)  09/10/20 226 lb (102.5 kg)      Other studies Reviewed: Additional studies/ records that were reviewed today include: Labs. Review of the above records demonstrates:  Please see elsewhere in the note.     ASSESSMENT AND PLAN:  HTN -    His blood pressure is controlled.  No change in therapy.   CAD, AUTOLOGOUS BYPASS GRAFT -  He had no high risk findings on Lexiscan Myoview in early April 2021.  He had only 1 episode of chest discomfort since then.  We will continue with risk reduction.  HYPERLIPIDEMIA-MIXED -  LDL was 49 with an HDL of 36.  No change in therapy.   CVA -  He had less than 50% stenosis bilaterally in 2021.  I will order follow-up carotid Dopplers in over a year.  OBESITY -  We again had a discussion about weight loss taking that we talked about his need to be more physically in 19 specific instructions.   Cardiomyopathy -  This was mildly reduced and has been stable and unchanged since 2010.  PVD He does have some reduced pulses but had negative abdominal ultrasound for aneurysm and has no claudication.  We will continue with risk reduction.    Current medicines are reviewed at length with the patient today.  The patient does not have concerns regarding medicines.  The following changes have been made:  None  Labs/ tests ordered today include:  None  No orders of the defined types were placed in this encounter.    Disposition:   FU with me in in 12  months   Signed, Minus Breeding, MD  10/14/2021 9:47 AM    [place]

## 2021-10-14 ENCOUNTER — Other Ambulatory Visit: Payer: Self-pay

## 2021-10-14 ENCOUNTER — Ambulatory Visit (INDEPENDENT_AMBULATORY_CARE_PROVIDER_SITE_OTHER): Payer: Medicare Other | Admitting: Cardiology

## 2021-10-14 ENCOUNTER — Encounter: Payer: Self-pay | Admitting: Cardiology

## 2021-10-14 VITALS — BP 122/74 | HR 71 | Ht 70.0 in | Wt 227.0 lb

## 2021-10-14 DIAGNOSIS — E785 Hyperlipidemia, unspecified: Secondary | ICD-10-CM

## 2021-10-14 DIAGNOSIS — I739 Peripheral vascular disease, unspecified: Secondary | ICD-10-CM

## 2021-10-14 DIAGNOSIS — I6523 Occlusion and stenosis of bilateral carotid arteries: Secondary | ICD-10-CM | POA: Diagnosis not present

## 2021-10-14 DIAGNOSIS — I428 Other cardiomyopathies: Secondary | ICD-10-CM | POA: Diagnosis not present

## 2021-10-14 DIAGNOSIS — I2510 Atherosclerotic heart disease of native coronary artery without angina pectoris: Secondary | ICD-10-CM

## 2021-10-14 NOTE — Patient Instructions (Signed)
Medication Instructions:  The current medical regimen is effective;  continue present plan and medications.  *If you need a refill on your cardiac medications before your next appointment, please call your pharmacy*  Testing/Procedures: Your physician has requested that you have a carotid duplex. This test is an ultrasound of the carotid arteries in your neck. It looks at blood flow through these arteries that supply the brain with blood. Allow one hour for this exam. There are no restrictions or special instructions.  This testing will be completed at [facility].  You will be contacted to be scheduled.  Follow-Up: At [facility], you and your health needs are our priority.  As part of our continuing mission to provide you with exceptional heart care, we have created designated Provider Care Teams.  These Care Teams include your primary Cardiologist (physician) and Advanced Practice Providers (APPs -  Physician Assistants and Nurse Practitioners) who all work together to provide you with the care you need, when you need it.  We recommend signing up for the patient portal called "MyChart".  Sign up information is provided on this After Visit Summary.  MyChart is used to connect with patients for Virtual Visits (Telemedicine).  Patients are able to view lab/test results, encounter notes, upcoming appointments, etc.  Non-urgent messages can be sent to your provider as well.   To learn more about what you can do with MyChart, go to [email or web address].    Your next appointment:   1 year(s)  The format for your next appointment:   In Person  Provider:   Minus Breeding, MD   Thank you for choosing [facility]!!

## 2021-10-27 ENCOUNTER — Other Ambulatory Visit: Payer: Self-pay

## 2021-10-27 ENCOUNTER — Ambulatory Visit (HOSPITAL_COMMUNITY)
Admission: RE | Admit: 2021-10-27 | Discharge: 2021-10-27 | Disposition: A | Discharge status: Home / Self Care | Payer: Medicare Other | Source: Ambulatory Visit | Attending: Cardiology | Admitting: Cardiology

## 2021-10-27 DIAGNOSIS — I6523 Occlusion and stenosis of bilateral carotid arteries: Secondary | ICD-10-CM | POA: Diagnosis not present

## 2021-11-02 ENCOUNTER — Encounter: Payer: Self-pay | Admitting: *Deleted

## 2021-11-09 DIAGNOSIS — L820 Inflamed seborrheic keratosis: Secondary | ICD-10-CM | POA: Diagnosis not present

## 2021-11-09 DIAGNOSIS — B078 Other viral warts: Secondary | ICD-10-CM | POA: Diagnosis not present

## 2021-12-02 DIAGNOSIS — H6123 Impacted cerumen, bilateral: Secondary | ICD-10-CM | POA: Diagnosis not present

## 2021-12-29 ENCOUNTER — Other Ambulatory Visit: Payer: Self-pay | Admitting: Family Medicine

## 2021-12-29 ENCOUNTER — Other Ambulatory Visit: Payer: Self-pay | Admitting: Cardiology

## 2021-12-29 DIAGNOSIS — F325 Major depressive disorder, single episode, in full remission: Secondary | ICD-10-CM

## 2021-12-30 ENCOUNTER — Ambulatory Visit (INDEPENDENT_AMBULATORY_CARE_PROVIDER_SITE_OTHER): Payer: Medicare Other

## 2021-12-30 VITALS — Ht 70.0 in | Wt 215.0 lb

## 2021-12-30 DIAGNOSIS — Z0000 Encounter for general adult medical examination without abnormal findings: Secondary | ICD-10-CM

## 2021-12-30 NOTE — Progress Notes (Signed)
Subjective:   [name] is a 76 y.o. male who presents for Medicare Annual/Subsequent preventive examination.  Virtual Visit via Telephone Note  I connected with  [name] on 12/30/21 at  3:30 PM EST by telephone and verified that I am speaking with the correct person using two identifiers.  Location: Patient: Home Provider: [facility] Persons participating in the virtual visit: patient/Nurse Health Advisor   I discussed the limitations, risks, security and privacy concerns of performing an evaluation and management service by telephone and the availability of in person appointments. The patient expressed understanding and agreed to proceed.  Interactive audio and video telecommunications were attempted between this nurse and patient, however failed, due to patient having technical difficulties OR patient did not have access to video capability.  We continued and completed visit with audio only.  Some vital signs may be absent or patient reported.   Daniel Julian E Eevee Borbon, LPN   Review of Systems     Cardiac Risk Factors include: advanced age (>3men, >59 women);male gender;dyslipidemia;obesity (BMI >30kg/m2);sedentary lifestyle;hypertension;Other (see comment), Risk factor comments: CAD, Cardiomyopathy, PVD     Objective:    Today's Vitals   12/30/21 1537  Weight: 215 lb (97.5 kg)  Height: 5\' 10"  (1.778 m)   Body mass index is 30.85 kg/m.  No flowsheet data found.  Current Medications (verified) Outpatient Encounter Medications as of 12/30/2021  Medication Sig   aspirin 81 MG tablet Take 81 mg by mouth daily.     atorvastatin (LIPITOR) 40 MG tablet TAKE 1 TABLET BY MOUTH ONCE DAILY . APPOINTMENT REQUIRED FOR FUTURE REFILLS   chlorthalidone (HYGROTON) 25 MG tablet Take 1 tablet by mouth once daily   citalopram (CELEXA) 20 MG tablet Take 1 tablet (20 mg total) by mouth daily. PATIENT MUST BE SEEN FOR ANY FURTHER REFILL.   nitroGLYCERIN (NITROSTAT) 0.4 MG SL tablet Place 1 tablet  (0.4 mg total) under the tongue every 5 (five) minutes as needed for chest pain.   vitamin C (ASCORBIC ACID) 500 MG tablet Take 500 mg by mouth daily.   No facility-administered encounter medications on file as of 12/30/2021.    Allergies (verified) Atorvastatin, Crestor [rosuvastatin calcium], Iodinated contrast media, and Niaspan [niacin er]   History: Past Medical History:  Diagnosis Date   Acid reflux    Carotid artery occlusion    right carotid stenting in 2007   Chest pain    Coronary artery disease    a. 2010 --> cath showing LM and 3-vessel CAD --> s/p CABG with LIMA-LAD, SVG-D1, SVG-OM2 and SVD-PDA   CVA (cerebral vascular accident) ([place])    Hyperlipidemia    Hypertension    Obesity    Past Surgical History:  Procedure Laterality Date   CORONARY ARTERY BYPASS GRAFT     TONSILLECTOMY     Family History  Problem Relation Age of Onset   Heart attack Father    Heart attack Brother    Social History   Socioeconomic History   Marital status: Married    Spouse name: [name]   Number of children: 2   Years of education: Not on file   Highest education level: Not on file  Occupational History   Occupation: Retired  Tobacco Use   Smoking status: Former    Packs/day: 1.50    Years: 41.00    Pack years: 61.50    Types: Cigarettes    Start date: 02/04/1965    Quit date: 12/27/2005    Years since quitting:  16.0   Smokeless tobacco: Never  Vaping Use   Vaping Use: Never used  Substance and Sexual Activity   Alcohol use: No    Alcohol/week: 0.0 standard drinks   Drug use: No   Sexual activity: Not Currently  Other Topics Concern   Not on file  Social History Narrative   Daughter lives with them   Son lives in [place]   Social Determinants of Health   Financial Resource Strain: Low Risk    Difficulty of Paying Living Expenses: Not hard at all  Food Insecurity: No Food Insecurity   Worried About Charity fundraiser in the Last Year: Never true   Academic librarian in the Last Year: Never true  Transportation Needs: No Transportation Needs   Lack of Transportation (Medical): No   Lack of Transportation (Non-Medical): No  Physical Activity: Insufficiently Active   Days of Exercise per Week: 7 days   Minutes of Exercise per Session: 10 min  Stress: No Stress Concern Present   Feeling of Stress : Not at all  Social Connections: Socially Integrated   Frequency of Communication with Friends and Family: More than three times a week   Frequency of Social Gatherings with Friends and Family: More than three times a week   Attends Religious Services: More than 4 times per year   Active Member of [employer] or Organizations: Yes   Attends Music therapist: More than 4 times per year   Marital Status: Married    Tobacco Counseling Counseling given: Not Answered   Clinical Intake:  Pre-visit preparation completed: Yes  Pain : No/denies pain     BMI - recorded: 30.85 Nutritional Status: BMI > 30  Obese Nutritional Risks: None Diabetes: No  How often do you need to have someone help you when you read instructions, pamphlets, or other written materials from your doctor or pharmacy?: 1 - Never  Diabetic? no  Interpreter Needed?: No  Information entered by :: Daniel Rogoff, LPN   Activities of Daily Living In your present state of health, do you have any difficulty performing the following activities: 12/30/2021  Hearing? Y  Vision? N  Difficulty concentrating or making decisions? Y  Walking or climbing stairs? N  Dressing or bathing? N  Doing errands, shopping? N  Preparing Food and eating ? N  Using the Toilet? N  In the past six months, have you accidently leaked urine? N  Do you have problems with loss of bowel control? N  Managing your Medications? N  Managing your Finances? N  Housekeeping or managing your Housekeeping? N  Some recent data might be hidden    Patient Care Team: Daniel Norlander, DO as PCP - General  (Family Medicine) Minus Breeding, MD as PCP - Cardiology (Cardiology) Ilean China, RN as [facility] Allyn Kenner, MD (Dermatology)  Indicate any recent Medical Services you may have received from other than [facility] providers in the past year (date may be approximate).     Assessment:   This is a routine wellness examination for Kenner.  Hearing/Vision screen Hearing Screening - Comments:: C/o moderate hearing difficulties - no hearing aids right now Vision Screening - Comments:: Wears rx glasses - up to date with annual eye exams at [facility] in [place] issues and exercise activities discussed: Current Exercise Habits: Home exercise routine, Type of exercise: walking, Time (Minutes): 10, Frequency (Times/Week): 7, Weekly Exercise (Minutes/Week): 70, Intensity: Mild, Exercise limited by:  cardiac condition(s)   Goals Addressed   None    Depression Screen PHQ 2/9 Scores 12/30/2021 11/24/2020 08/13/2020 08/10/2019 01/31/2019 07/31/2018 05/02/2018  PHQ - 2 Score 0 0 0 0 0 0 0  PHQ- 9 Score - 0 - 0 0 - -    Fall Risk Fall Risk  11/24/2020 01/31/2019 07/31/2018 05/02/2018 03/02/2018  Falls in the past year? 0 0 No No No    FALL RISK PREVENTION PERTAINING TO THE HOME:  Any stairs in or around the home? Yes  If so, are there any without handrails? Yes  - steps coming in front - only 3 - no rails - he doesn't use these much Home free of loose throw rugs in walkways, pet beds, electrical cords, etc? Yes  Adequate lighting in your home to reduce risk of falls? Yes   ASSISTIVE DEVICES UTILIZED TO PREVENT FALLS:  Life alert? No  Use of a cane, walker or w/c? No  Grab bars in the bathroom? Yes  Shower chair or bench in shower? Yes  Elevated toilet seat or a handicapped toilet? Yes   TIMED UP AND GO:  Was the test performed? No . Telephonic visit  Cognitive Function: MMSE - Mini Mental State Exam 05/02/2018  Orientation to time 5  Orientation to Place 5   Registration 3  Attention/ Calculation 5  Recall 3  Language- name 2 objects 2  Language- repeat 1  Language- follow 3 step command 3  Language- read & follow direction 1  Write a sentence 1  Copy design 1  Total score 30        Immunizations Immunization History  Administered Date(s) Administered   Fluad Quad(high Dose 65+) 11/24/2020   Influenza, High Dose Seasonal PF 01/09/2018, 10/26/2018, 11/06/2019   Influenza,inj,Quad PF,6+ Mos 10/22/2013, 10/01/2014, 12/17/2015   Moderna Sars-Covid-2 Vaccination 03/01/2020, 04/02/2020, 12/18/2020   Pneumococcal Conjugate-13 10/22/2013   Pneumococcal Polysaccharide-23 11/03/2010   Tdap 09/17/2011    TDAP status: Due, Education has been provided regarding the importance of this vaccine. Advised may receive this vaccine at local pharmacy or Health Dept. Aware to provide a copy of the vaccination record if obtained from local pharmacy or Health Dept. Verbalized acceptance and understanding.  Flu Vaccine status: Up to date  Pneumococcal vaccine status: Up to date  Covid-19 vaccine status: Completed vaccines  Qualifies for Shingles Vaccine? Yes   Zostavax completed No   Shingrix Completed?: No.    Education has been provided regarding the importance of this vaccine. Patient has been advised to call insurance company to determine out of pocket expense if they have not yet received this vaccine. Advised may also receive vaccine at local pharmacy or Health Dept. Verbalized acceptance and understanding.  Screening Tests Health Maintenance  Topic Date Due   Zoster Vaccines- Shingrix (1 of 2) Never done   COVID-19 Vaccine (4 - Booster for Moderna series) 02/12/2021   INFLUENZA VACCINE  07/27/2021   TETANUS/TDAP  09/16/2021   Pneumonia Vaccine 60+ Years old  Completed   Hepatitis C Screening  Completed   HPV VACCINES  Aged Out   COLONOSCOPY (Pts 45-78yrs Insurance coverage will need to be confirmed)  Discontinued    Health  Maintenance  Health Maintenance Due  Topic Date Due   Zoster Vaccines- Shingrix (1 of 2) Never done   COVID-19 Vaccine (4 - Booster for Moderna series) 02/12/2021   INFLUENZA VACCINE  07/27/2021   TETANUS/TDAP  09/16/2021    Colorectal cancer screening: No longer required.  Lung Cancer Screening: (Low Dose CT Chest recommended if Age 26-80 years, 30 pack-year currently smoking OR have quit w/in 15years.) does not qualify.  Additional Screening:  Hepatitis C Screening: does qualify; Completed 03/02/2018  Vision Screening: Recommended annual ophthalmology exams for early detection of glaucoma and other disorders of the eye. Is the patient up to date with their annual eye exam?  Yes  Who is the provider or what is the name of the office in which the patient attends annual eye exams? [facility] If pt is not established with a provider, would they like to be referred to a provider to establish care? No .   Dental Screening: Recommended annual dental exams for proper oral hygiene  Community Resource Referral / Chronic Care Management: CRR required this visit?  No   CCM required this visit?  No      Plan:     I have personally reviewed and noted the following in the patients chart:   Medical and social history Use of alcohol, tobacco or illicit drugs  Current medications and supplements including opioid prescriptions. Patient is not currently taking opioid prescriptions. Functional ability and status Nutritional status Physical activity Advanced directives List of other physicians Hospitalizations, surgeries, and ER visits in previous 12 months Vitals Screenings to include cognitive, depression, and falls Referrals and appointments  In addition, I have reviewed and discussed with patient certain preventive protocols, quality metrics, and best practice recommendations. A written personalized care plan for preventive services as well as general preventive health  recommendations were provided to patient.     Lucah Petta E Katrice Goel, LPN   06/01/5992   Due to this being a telephonic visit, the after visit summary with patients personalized plan was offered to patient via mail or my-chart. Patient declined at this time  Nurse Notes: He is concerned about his memory, but doesn't want an appt right now - advised brain games, puzzles, etc -also discussed Prevagen, which he says he won't pay for and Omega-3

## 2021-12-30 NOTE — Patient Instructions (Signed)
[name] , Thank you for taking time to come for your Medicare Wellness Visit. I appreciate your ongoing commitment to your health goals. Please review the following plan we discussed and let me know if I can assist you in the future.   Screening recommendations/referrals: Colonoscopy: Done 01/04/2011 - repeat 10 years? Recommended yearly ophthalmology/optometry visit for glaucoma screening and checkup Recommended yearly dental visit for hygiene and checkup  Vaccinations: Influenza vaccine: Done 09/2021 - Repeat annually  Pneumococcal vaccine: Done 11/03/2010 & 10/22/2013 Tdap vaccine: Done 09/17/2011 - Repeat in 10 years *Due Shingles vaccine: Due   Covid-19: Done 03/01/2020, 04/02/2020, & 12/18/2020  Advanced directives: Advance directive discussed with you today. Even though you declined this today, please call our office should you change your mind, and we can give you the proper paperwork for you to fill out.   Conditions/risks identified: Aim for 30 minutes of exercise or brisk walking each day, drink 6-8 glasses of water and eat lots of fruits and vegetables.   Next appointment: Follow up in one year for your annual wellness visit.   Preventive Care 5 Years and Older, Male  Preventive care refers to lifestyle choices and visits with your health care provider that can promote health and wellness. What does preventive care include? A yearly physical exam. This is also called an annual well check. Dental exams once or twice a year. Routine eye exams. Ask your health care provider how often you should have your eyes checked. Personal lifestyle choices, including: Daily care of your teeth and gums. Regular physical activity. Eating a healthy diet. Avoiding tobacco and drug use. Limiting alcohol use. Practicing safe sex. Taking low doses of aspirin every day. Taking vitamin and mineral supplements as recommended by your health care provider. What happens during an annual well  check? The services and screenings done by your health care provider during your annual well check will depend on your age, overall health, lifestyle risk factors, and family history of disease. Counseling  Your health care provider may ask you questions about your: Alcohol use. Tobacco use. Drug use. Emotional well-being. Home and relationship well-being. Sexual activity. Eating habits. History of falls. Memory and ability to understand (cognition). Work and work Statistician. Screening  You may have the following tests or measurements: Height, weight, and BMI. Blood pressure. Lipid and cholesterol levels. These may be checked every 5 years, or more frequently if you are over 68 years old. Skin check. Lung cancer screening. You may have this screening every year starting at age 74 if you have a 30-pack-year history of smoking and currently smoke or have quit within the past 15 years. Fecal occult blood test (FOBT) of the stool. You may have this test every year starting at age 24. Flexible sigmoidoscopy or colonoscopy. You may have a sigmoidoscopy every 5 years or a colonoscopy every 10 years starting at age 9. Prostate cancer screening. Recommendations will vary depending on your family history and other risks. Hepatitis C blood test. Hepatitis B blood test. Sexually transmitted disease (STD) testing. Diabetes screening. This is done by checking your blood sugar (glucose) after you have not eaten for a while (fasting). You may have this done every 1-3 years. Abdominal aortic aneurysm (AAA) screening. You may need this if you are a current or former smoker. Osteoporosis. You may be screened starting at age 5 if you are at high risk. Talk with your health care provider about your test results, treatment options, and if necessary, the need for  more tests. Vaccines  Your health care provider may recommend certain vaccines, such as: Influenza vaccine. This is recommended every  year. Tetanus, diphtheria, and acellular pertussis (Tdap, Td) vaccine. You may need a Td booster every 10 years. Zoster vaccine. You may need this after age 86. Pneumococcal 13-valent conjugate (PCV13) vaccine. One dose is recommended after age 69. Pneumococcal polysaccharide (PPSV23) vaccine. One dose is recommended after age 23. Talk to your health care provider about which screenings and vaccines you need and how often you need them. This information is not intended to replace advice given to you by your health care provider. Make sure you discuss any questions you have with your health care provider. Document Released: 01/09/2016 Document Revised: 09/01/2016 Document Reviewed: 10/14/2015 Elsevier Interactive Patient Education  2017 [place] Prevention in the Home Falls can cause injuries. They can happen to people of all ages. There are many things you can do to make your home safe and to help prevent falls. What can I do on the outside of my home? Regularly fix the edges of walkways and driveways and fix any cracks. Remove anything that might make you trip as you walk through a door, such as a raised step or threshold. Trim any bushes or trees on the path to your home. Use bright outdoor lighting. Clear any walking paths of anything that might make someone trip, such as rocks or tools. Regularly check to see if handrails are loose or broken. Make sure that both sides of any steps have handrails. Any raised decks and porches should have guardrails on the edges. Have any leaves, snow, or ice cleared regularly. Use sand or salt on walking paths during winter. Clean up any spills in your garage right away. This includes oil or grease spills. What can I do in the bathroom? Use night lights. Install grab bars by the toilet and in the tub and shower. Do not use towel bars as grab bars. Use non-skid mats or decals in the tub or shower. If you need to sit down in the shower, use a  plastic, non-slip stool. Keep the floor dry. Clean up any water that spills on the floor as soon as it happens. Remove soap buildup in the tub or shower regularly. Attach bath mats securely with double-sided non-slip rug tape. Do not have throw rugs and other things on the floor that can make you trip. What can I do in the bedroom? Use night lights. Make sure that you have a light by your bed that is easy to reach. Do not use any sheets or blankets that are too big for your bed. They should not hang down onto the floor. Have a firm chair that has side arms. You can use this for support while you get dressed. Do not have throw rugs and other things on the floor that can make you trip. What can I do in the kitchen? Clean up any spills right away. Avoid walking on wet floors. Keep items that you use a lot in easy-to-reach places. If you need to reach something above you, use a strong step stool that has a grab bar. Keep electrical cords out of the way. Do not use floor polish or wax that makes floors slippery. If you must use wax, use non-skid floor wax. Do not have throw rugs and other things on the floor that can make you trip. What can I do with my stairs? Do not leave any items on the stairs. Make  sure that there are handrails on both sides of the stairs and use them. Fix handrails that are broken or loose. Make sure that handrails are as long as the stairways. Check any carpeting to make sure that it is firmly attached to the stairs. Fix any carpet that is loose or worn. Avoid having throw rugs at the top or bottom of the stairs. If you do have throw rugs, attach them to the floor with carpet tape. Make sure that you have a light switch at the top of the stairs and the bottom of the stairs. If you do not have them, ask someone to add them for you. What else can I do to help prevent falls? Wear shoes that: Do not have high heels. Have rubber bottoms. Are comfortable and fit you  well. Are closed at the toe. Do not wear sandals. If you use a stepladder: Make sure that it is fully opened. Do not climb a closed stepladder. Make sure that both sides of the stepladder are locked into place. Ask someone to hold it for you, if possible. Clearly mark and make sure that you can see: Any grab bars or handrails. First and last steps. Where the edge of each step is. Use tools that help you move around (mobility aids) if they are needed. These include: Canes. Walkers. Scooters. Crutches. Turn on the lights when you go into a dark area. Replace any light bulbs as soon as they burn out. Set up your furniture so you have a clear path. Avoid moving your furniture around. If any of your floors are uneven, fix them. If there are any pets around you, be aware of where they are. Review your medicines with your doctor. Some medicines can make you feel dizzy. This can increase your chance of falling. Ask your doctor what other things that you can do to help prevent falls. This information is not intended to replace advice given to you by your health care provider. Make sure you discuss any questions you have with your health care provider. Document Released: 10/09/2009 Document Revised: 05/20/2016 Document Reviewed: 01/17/2015 Elsevier Interactive Patient Education  2017 [employer].

## 2022-01-11 DIAGNOSIS — C44619 Basal cell carcinoma of skin of left upper limb, including shoulder: Secondary | ICD-10-CM | POA: Diagnosis not present

## 2022-01-11 DIAGNOSIS — C4441 Basal cell carcinoma of skin of scalp and neck: Secondary | ICD-10-CM | POA: Diagnosis not present

## 2022-01-11 DIAGNOSIS — L910 Hypertrophic scar: Secondary | ICD-10-CM | POA: Diagnosis not present

## 2022-02-04 ENCOUNTER — Other Ambulatory Visit: Payer: Self-pay | Admitting: Family Medicine

## 2022-02-04 DIAGNOSIS — F325 Major depressive disorder, single episode, in full remission: Secondary | ICD-10-CM

## 2022-02-05 ENCOUNTER — Encounter: Payer: Self-pay | Admitting: Family Medicine

## 2022-02-05 ENCOUNTER — Telehealth: Payer: Self-pay | Admitting: Family Medicine

## 2022-02-05 ENCOUNTER — Other Ambulatory Visit: Payer: Self-pay | Admitting: *Deleted

## 2022-02-05 DIAGNOSIS — F325 Major depressive disorder, single episode, in full remission: Secondary | ICD-10-CM

## 2022-02-05 MED ORDER — CITALOPRAM HYDROBROMIDE 20 MG PO TABS: 20.0000 mg | ORAL_TABLET | Freq: Every day | ORAL | 0 refills | Status: DC

## 2022-02-05 NOTE — Telephone Encounter (Signed)
One month given

## 2022-02-05 NOTE — Telephone Encounter (Signed)
Letter sent.

## 2022-02-05 NOTE — Telephone Encounter (Signed)
[place]. NTBS 30 days given 12/29/21

## 2022-02-25 DIAGNOSIS — Z08 Encounter for follow-up examination after completed treatment for malignant neoplasm: Secondary | ICD-10-CM | POA: Diagnosis not present

## 2022-02-25 DIAGNOSIS — Z85828 Personal history of other malignant neoplasm of skin: Secondary | ICD-10-CM | POA: Diagnosis not present

## 2022-03-03 ENCOUNTER — Other Ambulatory Visit: Payer: Self-pay | Admitting: Family Medicine

## 2022-03-03 DIAGNOSIS — F325 Major depressive disorder, single episode, in full remission: Secondary | ICD-10-CM

## 2022-03-09 ENCOUNTER — Ambulatory Visit: Payer: Medicare Other | Admitting: Family Medicine

## 2022-03-16 ENCOUNTER — Encounter: Payer: Self-pay | Admitting: Family Medicine

## 2022-03-16 ENCOUNTER — Ambulatory Visit (INDEPENDENT_AMBULATORY_CARE_PROVIDER_SITE_OTHER): Payer: Medicare Other | Admitting: Family Medicine

## 2022-03-16 VITALS — BP 101/69 | HR 77 | Temp 97.9°F | Resp 20 | Ht 70.0 in | Wt 226.0 lb

## 2022-03-16 DIAGNOSIS — H6123 Impacted cerumen, bilateral: Secondary | ICD-10-CM | POA: Diagnosis not present

## 2022-03-16 DIAGNOSIS — F325 Major depressive disorder, single episode, in full remission: Secondary | ICD-10-CM | POA: Diagnosis not present

## 2022-03-16 DIAGNOSIS — R413 Other amnesia: Secondary | ICD-10-CM | POA: Diagnosis not present

## 2022-03-16 DIAGNOSIS — N400 Enlarged prostate without lower urinary tract symptoms: Secondary | ICD-10-CM | POA: Diagnosis not present

## 2022-03-16 DIAGNOSIS — H60331 Swimmer's ear, right ear: Secondary | ICD-10-CM

## 2022-03-16 DIAGNOSIS — I25810 Atherosclerosis of coronary artery bypass graft(s) without angina pectoris: Secondary | ICD-10-CM

## 2022-03-16 MED ORDER — CIPROFLOXACIN-DEXAMETHASONE 0.3-0.1 % OT SUSP: 4.0000 [drp] | Freq: Two times a day (BID) | OTIC | 0 refills | Status: AC

## 2022-03-16 MED ORDER — CITALOPRAM HYDROBROMIDE 20 MG PO TABS: 20.0000 mg | ORAL_TABLET | Freq: Every day | ORAL | 3 refills | Status: DC

## 2022-03-16 MED ORDER — NITROGLYCERIN 0.4 MG SL SUBL: 0.4000 mg | SUBLINGUAL_TABLET | SUBLINGUAL | 3 refills | Status: DC | PRN

## 2022-03-16 NOTE — Patient Instructions (Addendum)
Come in for FASTING labs this week. ? ?Memory Compensation Strategies ? ?Use "WARM" strategy. ? W= write it down ? A= associate it ? R= repeat it ? M= make a mental note ? ?2.   You can keep a Social worker. ? Use a 3-ring notebook with sections for the following: calendar, important names and phone numbers,  medications, doctors' names/phone numbers, lists/reminders, and a section to journal what you did  each day.  ? ?3.    Use a calendar to write appointments down. ? ?4.    Write yourself a schedule for the day. ? This can be placed on the calendar or in a separate section of the Memory Notebook.  Keeping a  regular schedule can help memory. ? ?5.    Use medication organizer with sections for each day or morning/evening pills. ? You may need help loading it ? ?6.    Keep a basket, or pegboard by the door. ? Place items that you need to take out with you in the basket or on the pegboard.  You may also want to  include a message board for reminders. ? ?7.    Use sticky notes. ? Place sticky notes with reminders in a place where the task is performed.  For example: " turn off the  stove" placed by the stove, "lock the door" placed on the door at eye level, " take your medications" on  the bathroom mirror or by the place where you normally take your medications. ? ?8.    Use alarms/timers. ? Use while cooking to remind yourself to check on food or as a reminder to take your medicine, or as a  reminder to make a call, or as a reminder to perform another task, etc. ? ?

## 2022-03-16 NOTE — Progress Notes (Signed)
? ?Subjective: ?CC: Chronic follow-up ?PCP: [name], DO ?[name] is a 76 y.o. male presenting to clinic today for: ? ?1.  CAD ?Patient is treated with atorvastatin, chlorthalidone, baby aspirin and has Nitrostat on hand but thinks that his Nitrostat is likely expired.  Asking for renewal of that.  No chest pain, shortness of breath, headache, edema or visual disturbance reported ? ?2.  Depressive disorder ?Patient reports stability and depressive disorder.  Celexa renewed.  He had some concerns about memory on his most recent Medicare annual wellness exam and he is agreeable to memory testing today. ? ?3.  Ear fullness ?Patient thinks he may need his ears washed out.  He has had bilateral ear fullness.  He does have hearing aids but does not wear it.  Does not report any drainage or pain. ? ? ?ROS: Per HPI ? ?Allergies  ?Allergen Reactions  ? Atorvastatin Other (See Comments)  ?  Muscle aches  ? Crestor [Rosuvastatin Calcium] Other (See Comments)  ?  Muscle aches  ? Iodinated Contrast Media Rash  ?  rash  ? Niaspan [Niacin Er] Other (See Comments)  ?  Muscle aches  ? ?Past Medical History:  ?Diagnosis Date  ? Acid reflux   ? Carotid artery occlusion   ? right carotid stenting in 2007  ? Chest pain   ? Coronary artery disease   ? a. 2010 --> cath showing LM and 3-vessel CAD --> s/p CABG with LIMA-LAD, SVG-D1, SVG-OM2 and SVD-PDA  ? CVA (cerebral vascular accident) [facility])   ? Hyperlipidemia   ? Hypertension   ? Obesity   ? ? ?Current Outpatient Medications:  ?  aspirin 81 MG tablet, Take 81 mg by mouth daily.  , Disp: , Rfl:  ?  atorvastatin (LIPITOR) 40 MG tablet, TAKE 1 TABLET BY MOUTH ONCE DAILY . APPOINTMENT REQUIRED FOR FUTURE REFILLS, Disp: 90 tablet, Rfl: 2 ?  chlorthalidone (HYGROTON) 25 MG tablet, Take 1 tablet by mouth once daily, Disp: 90 tablet, Rfl: 2 ?  citalopram (CELEXA) 20 MG tablet, Take 1 tablet (20 mg total) by mouth daily., Disp: 30 tablet, Rfl: 0 ?  vitamin C (ASCORBIC ACID)  500 MG tablet, Take 500 mg by mouth daily., Disp: , Rfl:  ?  nitroGLYCERIN (NITROSTAT) 0.4 MG SL tablet, Place 1 tablet (0.4 mg total) under the tongue every 5 (five) minutes as needed for chest pain. (Patient not taking: Reported on 03/16/2022), Disp: 25 tablet, Rfl: 3 ?Social History  ? ?Socioeconomic History  ? Marital status: Married  ?  Spouse name: [name]  ? Number of children: 2  ? Years of education: Not on file  ? Highest education level: Not on file  ?Occupational History  ? Occupation: Retired  ?Tobacco Use  ? Smoking status: Former  ?  Packs/day: 1.50  ?  Years: 41.00  ?  Pack years: 61.50  ?  Types: Cigarettes  ?  Start date: 02/04/1965  ?  Quit date: 12/27/2005  ?  Years since quitting: 16.2  ? Smokeless tobacco: Never  ?Vaping Use  ? Vaping Use: Never used  ?Substance and Sexual Activity  ? Alcohol use: No  ?  Alcohol/week: 0.0 standard drinks  ? Drug use: No  ? Sexual activity: Not Currently  ?Other Topics Concern  ? Not on file  ?Social History Narrative  ? Daughter lives with them  ? Son lives in [place]  ? ?Social Determinants of Health  ? ?Financial Resource Strain: Low Risk   ?  Difficulty of Paying Living Expenses: Not hard at all  ?Food Insecurity: No Food Insecurity  ? Worried About Charity fundraiser in the Last Year: Never true  ? Ran Out of Food in the Last Year: Never true  ?Transportation Needs: No Transportation Needs  ? Lack of Transportation (Medical): No  ? Lack of Transportation (Non-Medical): No  ?Physical Activity: Insufficiently Active  ? Days of Exercise per Week: 7 days  ? Minutes of Exercise per Session: 10 min  ?Stress: No Stress Concern Present  ? Feeling of Stress : Not at all  ?Social Connections: Socially Integrated  ? Frequency of Communication with Friends and Family: More than three times a week  ? Frequency of Social Gatherings with Friends and Family: More than three times a week  ? Attends Religious Services: More than 4 times per year  ? Active Member of Clubs or  Organizations: Yes  ? Attends Archivist Meetings: More than 4 times per year  ? Marital Status: Married  ?Intimate Partner Violence: Not At Risk  ? Fear of Current or Ex-Partner: No  ? Emotionally Abused: No  ? Physically Abused: No  ? Sexually Abused: No  ? ?Family History  ?Problem Relation Age of Onset  ? Heart attack Father   ? Heart attack Brother   ? ? ?Objective: ?Office vital signs reviewed. ?BP 101/69   Pulse 77   Temp 97.9 ?F (36.6 ?C)   Resp 20   Ht '5\' 10"'  (1.778 m)   Wt 226 lb (102.5 kg)   SpO2 95%   BMI 32.43 kg/m?  ? ?Physical Examination:  ?General: Awake, alert, well nourished, No acute distress ?HEENT: TMs obscured bilaterally by cerumen.  Reevaluation showed creamy, exudative substance on the right external auditory canal.  It is erythematous.  No bleeding ?Cardio: regular rate and rhythm, S1S2 heard, no murmurs appreciated ?Pulm: clear to auscultation bilaterally, no wheezes, rhonchi or rales; normal work of breathing on room air ?MSK: normal gait and station ?Skin: dry; intact; no rashes or lesions ?Neuro: see MMSE ? ?Assessment/ Plan: ?76 y.o. male  ? ?Coronary atherosclerosis of autologous vein bypass graft without angina - Plan: nitroGLYCERIN (NITROSTAT) 0.4 MG SL tablet, CMP14+EGFR, Lipid panel ? ?Major depressive disorder with single episode, in full remission ([place]) - Plan: citalopram (CELEXA) 20 MG tablet ? ?Memory problem - Plan: CMP14+EGFR, TSH, CBC, Vitamin B12 ? ?Benign prostatic hyperplasia without lower urinary tract symptoms - Plan: PSA ? ?Bilateral impacted cerumen ? ?Acute swimmer's ear of right side - Plan: ciprofloxacin-dexamethasone (CIPRODEX) OTIC suspension ? ?Asymptomatic from a CAD standpoint.  He will come in for fasting labs this week.  Nitroglycerin renewed ? ?Depressive disorder is stable.  Celexa renewed. ? ?Some concern about memory from the patient standpoint.  MMSE performed.  This is a negative dementia screen.  We will check vitamin  levels ? ?Check TSH given known BPH. ? ?Cerumen impaction irrigated today.  His right ear showed some exudative changes suggestive of otitis externa.  Ciprodex has been prescribed ? ?No orders of the defined types were placed in this encounter. ? ?No orders of the defined types were placed in this encounter. ? ? ? ?[name], DO ?[place] ?[phone] ? ? ?

## 2022-05-21 DIAGNOSIS — N3091 Cystitis, unspecified with hematuria: Secondary | ICD-10-CM | POA: Diagnosis not present

## 2022-05-21 DIAGNOSIS — R300 Dysuria: Secondary | ICD-10-CM | POA: Diagnosis not present

## 2022-06-02 DIAGNOSIS — R300 Dysuria: Secondary | ICD-10-CM | POA: Diagnosis not present

## 2022-07-09 ENCOUNTER — Ambulatory Visit: Payer: Self-pay | Admitting: *Deleted

## 2022-07-09 NOTE — Patient Instructions (Signed)
[name]  I have previously worked with you through the Chronic Care Management Program at [facility]. Due to program changes I am removing myself from your care team because you've either met our goals, your conditions are stable and no longer require care management, or we haven't engaged within the past 6 months. If you are currently active with another CCM Team Member, you will remain active with them unless they reach out to you with additional information. If you feel that you need RN Care Management services in the future, please talk with your primary care provider to discuss re-engagement with the RN Care Manager that will be assigned to [name]. This does not affect your status as a patient at [facility].   Thank you for allowing me to participate in your your healthcare journey.  Chong Sicilian, BSN, RN-BC Embedded Chronic Care Manager [facility] Direct Dial: [phone]

## 2022-07-09 NOTE — Chronic Care Management (AMB) (Signed)
  Chronic Care Management   Note  07/09/2022 Name: [name] MRN: [id number] DOB: July 25, 1946   Patient has either met RN Care Management goals, is stable from [facility] Management perspective, or has not recently engaged with the RN Care Manager. I am removing RN Care Manager from Care Team and closing [place]. If patient is currently engaged with another CCM team member I will forward this encounter to inform them of my case closure. Patient may be eligible for re-engagement with RN Care Manager in the future if necessary and can discuss this with their PCP.  Chong Sicilian, BSN, RN-BC Embedded Chronic Care Manager [facility] [place] [facility] Direct Dial: [phone]

## 2022-09-02 ENCOUNTER — Emergency Department (HOSPITAL_COMMUNITY)
Admission: EM | Admit: 2022-09-02 | Discharge: 2022-09-03 | Disposition: A | Discharge status: Home / Self Care | Payer: Medicare Other | Attending: Emergency Medicine | Admitting: Emergency Medicine

## 2022-09-02 ENCOUNTER — Emergency Department (HOSPITAL_COMMUNITY): Payer: Medicare Other

## 2022-09-02 ENCOUNTER — Other Ambulatory Visit: Payer: Self-pay

## 2022-09-02 ENCOUNTER — Encounter (HOSPITAL_COMMUNITY): Payer: Self-pay

## 2022-09-02 DIAGNOSIS — R791 Abnormal coagulation profile: Secondary | ICD-10-CM | POA: Diagnosis not present

## 2022-09-02 DIAGNOSIS — I499 Cardiac arrhythmia, unspecified: Secondary | ICD-10-CM | POA: Diagnosis not present

## 2022-09-02 DIAGNOSIS — U071 COVID-19: Secondary | ICD-10-CM | POA: Insufficient documentation

## 2022-09-02 DIAGNOSIS — Z743 Need for continuous supervision: Secondary | ICD-10-CM | POA: Diagnosis not present

## 2022-09-02 DIAGNOSIS — Z7982 Long term (current) use of aspirin: Secondary | ICD-10-CM | POA: Insufficient documentation

## 2022-09-02 DIAGNOSIS — R918 Other nonspecific abnormal finding of lung field: Secondary | ICD-10-CM | POA: Diagnosis not present

## 2022-09-02 DIAGNOSIS — N390 Urinary tract infection, site not specified: Secondary | ICD-10-CM | POA: Insufficient documentation

## 2022-09-02 DIAGNOSIS — R509 Fever, unspecified: Secondary | ICD-10-CM | POA: Diagnosis present

## 2022-09-02 DIAGNOSIS — J849 Interstitial pulmonary disease, unspecified: Secondary | ICD-10-CM | POA: Diagnosis not present

## 2022-09-02 DIAGNOSIS — R0902 Hypoxemia: Secondary | ICD-10-CM | POA: Diagnosis not present

## 2022-09-02 DIAGNOSIS — R000 Tachycardia, unspecified: Secondary | ICD-10-CM | POA: Diagnosis not present

## 2022-09-02 DIAGNOSIS — R6889 Other general symptoms and signs: Secondary | ICD-10-CM | POA: Diagnosis not present

## 2022-09-02 LAB — CBC WITH DIFFERENTIAL/PLATELET
Abs Immature Granulocytes: 0.02 10*3/uL (ref 0.00–0.07)
Basophils Absolute: 0 10*3/uL (ref 0.0–0.1)
Basophils Relative: 1 %
Eosinophils Absolute: 0.1 10*3/uL (ref 0.0–0.5)
Eosinophils Relative: 1 %
HCT: 45.7 % (ref 39.0–52.0)
Hemoglobin: 15.6 g/dL (ref 13.0–17.0)
Immature Granulocytes: 0 %
Lymphocytes Relative: 4 %
Lymphs Abs: 0.3 10*3/uL — ABNORMAL LOW (ref 0.7–4.0)
MCH: 31 pg (ref 26.0–34.0)
MCHC: 34.1 g/dL (ref 30.0–36.0)
MCV: 90.7 fL (ref 80.0–100.0)
Monocytes Absolute: 0.8 10*3/uL (ref 0.1–1.0)
Monocytes Relative: 10 %
Neutro Abs: 7.3 10*3/uL (ref 1.7–7.7)
Neutrophils Relative %: 84 %
Platelets: 182 10*3/uL (ref 150–400)
RBC: 5.04 MIL/uL (ref 4.22–5.81)
RDW: 13.1 % (ref 11.5–15.5)
WBC: 8.6 10*3/uL (ref 4.0–10.5)
nRBC: 0 % (ref 0.0–0.2)

## 2022-09-02 LAB — COMPREHENSIVE METABOLIC PANEL
ALT: 19 U/L (ref 0–44)
AST: 22 U/L (ref 15–41)
Albumin: 3.9 g/dL (ref 3.5–5.0)
Alkaline Phosphatase: 54 U/L (ref 38–126)
Anion gap: 12 (ref 5–15)
BUN: 13 mg/dL (ref 8–23)
CO2: 27 mmol/L (ref 22–32)
Calcium: 8.9 mg/dL (ref 8.9–10.3)
Chloride: 97 mmol/L — ABNORMAL LOW (ref 98–111)
Creatinine, Ser: 0.87 mg/dL (ref 0.61–1.24)
GFR, Estimated: >60 mL/min (ref >60)
Glucose, Bld: 109 mg/dL — ABNORMAL HIGH (ref 70–99)
Potassium: 3.6 mmol/L (ref 3.5–5.1)
Sodium: 136 mmol/L (ref 135–145)
Total Bilirubin: 1.2 mg/dL (ref 0.3–1.2)
Total Protein: 7.4 g/dL (ref 6.5–8.1)

## 2022-09-02 LAB — LACTIC ACID, PLASMA: Lactic Acid, Venous: 1.8 mmol/L (ref 0.5–1.9)

## 2022-09-02 LAB — TROPONIN I (HIGH SENSITIVITY): Troponin I (High Sensitivity): 11 ng/L (ref <18)

## 2022-09-02 LAB — PROTIME-INR
INR: 1.1 (ref 0.8–1.2)
Prothrombin Time: 14.4 seconds (ref 11.4–15.2)

## 2022-09-02 LAB — APTT: aPTT: 30 seconds (ref 24–36)

## 2022-09-02 NOTE — ED Triage Notes (Signed)
Pt c/o productive cough for 2 weeks and not feeling well. EMS reports pt oxygen sats are 90-94 on room air.

## 2022-09-03 LAB — URINALYSIS, ROUTINE W REFLEX MICROSCOPIC
Bilirubin Urine: NEGATIVE
Glucose, UA: NEGATIVE mg/dL
Hgb urine dipstick: NEGATIVE
Ketones, ur: NEGATIVE mg/dL
Nitrite: NEGATIVE
Protein, ur: 30 mg/dL — AB
RBC / HPF: >50 RBC/hpf — ABNORMAL HIGH (ref 0–5)
Specific Gravity, Urine: 1.024 (ref 1.005–1.030)
WBC, UA: >50 WBC/hpf — ABNORMAL HIGH (ref 0–5)
pH: 6 (ref 5.0–8.0)

## 2022-09-03 LAB — RESP PANEL BY RT-PCR (FLU A&B, COVID) ARPGX2
Influenza A by PCR: NEGATIVE
Influenza B by PCR: NEGATIVE
SARS Coronavirus 2 by RT PCR: POSITIVE — AB

## 2022-09-03 LAB — LACTIC ACID, PLASMA: Lactic Acid, Venous: 1.3 mmol/L (ref 0.5–1.9)

## 2022-09-03 MED ORDER — ACETAMINOPHEN 325 MG PO TABS
650.0000 mg | ORAL_TABLET | Freq: Once | ORAL | Status: AC
  Administered 2022-09-03: 650 mg via ORAL
  Filled 2022-09-03: qty 2

## 2022-09-03 MED ORDER — CEPHALEXIN 500 MG PO CAPS: 500.0000 mg | ORAL_CAPSULE | Freq: Three times a day (TID) | ORAL | 0 refills | Status: DC

## 2022-09-03 MED ORDER — NIRMATRELVIR/RITONAVIR (PAXLOVID)TABLET: 3.0000 | ORAL_TABLET | Freq: Two times a day (BID) | ORAL | 0 refills | Status: AC

## 2022-09-03 NOTE — ED Provider Notes (Signed)
[facility] EMERGENCY DEPARTMENT Provider Note   CSN: [id number] Arrival date & time: 09/02/22  2208     History  Chief Complaint  Patient presents with   Fever   Weakness    [name] is a 76 y.o. male.  Patient presents to the emergency department for evaluation of generalized weakness.  Patient reports that he has had a cough for approximately 2 weeks.  He started feeling much worse today.  Extremely weak, try to get out of bed today, his feet slid out from him and he sat down hard onto the floor.  No injury.  He was unable to get back up because of the generalized weakness.       Home Medications Prior to Admission medications   Medication Sig Start Date End Date Taking? Authorizing Provider  cephALEXin (KEFLEX) 500 MG capsule Take 1 capsule (500 mg total) by mouth 3 (three) times daily. 09/03/22  Yes Ishitha Roper, Gwenyth Allegra, MD  nirmatrelvir/ritonavir EUA (PAXLOVID) 20 x 150 MG & 10 x '100MG'$  TABS Take 3 tablets by mouth 2 (two) times daily for 5 days. Patient GFR is 60. Take nirmatrelvir (150 mg) two tablets twice daily for 5 days and ritonavir (100 mg) one tablet twice daily for 5 days. 09/03/22 09/08/22 Yes Tytan Sandate, Gwenyth Allegra, MD  aspirin 81 MG tablet Take 81 mg by mouth daily.      [provider]  atorvastatin (LIPITOR) 40 MG tablet TAKE 1 TABLET BY MOUTH ONCE DAILY . APPOINTMENT REQUIRED FOR FUTURE REFILLS 12/29/21   Minus Breeding, MD  chlorthalidone (HYGROTON) 25 MG tablet Take 1 tablet by mouth once daily 12/29/21   Minus Breeding, MD  citalopram (CELEXA) 20 MG tablet Take 1 tablet (20 mg total) by mouth daily. 03/16/22   [name], DO  nitroGLYCERIN (NITROSTAT) 0.4 MG SL tablet Place 1 tablet (0.4 mg total) under the tongue every 5 (five) minutes as needed for chest pain. 03/16/22   [name], DO  vitamin C (ASCORBIC ACID) 500 MG tablet Take 500 mg by mouth daily.    [provider]      Allergies    Atorvastatin, Crestor  [rosuvastatin calcium], Iodinated contrast media, and Niaspan [niacin er]    Review of Systems   Review of Systems  Physical Exam Updated Vital Signs BP 115/82   Pulse 88   Temp 98.6 F (37 C) (Oral)   Resp (!) 24   Ht '5\' 10"'$  (1.778 m)   Wt 97.5 kg   SpO2 95%   BMI 30.85 kg/m  Physical Exam Vitals and nursing note reviewed.  Constitutional:      General: He is not in acute distress.    Appearance: He is well-developed.  HENT:     Head: Normocephalic and atraumatic.     Mouth/Throat:     Mouth: Mucous membranes are moist.  Eyes:     General: Vision grossly intact. Gaze aligned appropriately.     Extraocular Movements: Extraocular movements intact.     Conjunctiva/sclera: Conjunctivae normal.  Cardiovascular:     Rate and Rhythm: Normal rate and regular rhythm.     Pulses: Normal pulses.     Heart sounds: Normal heart sounds, S1 normal and S2 normal. No murmur heard.    No friction rub. No gallop.  Pulmonary:     Effort: Pulmonary effort is normal. No respiratory distress.     Breath sounds: Normal breath sounds.  Abdominal:     Palpations: Abdomen is soft.  Tenderness: There is no abdominal tenderness. There is no guarding or rebound.     Hernia: No hernia is present.  Genitourinary:    Comments: Significant phimosis present Musculoskeletal:        General: No swelling.     Cervical back: Full passive range of motion without pain, normal range of motion and neck supple. No pain with movement, spinous process tenderness or muscular tenderness. Normal range of motion.     Right lower leg: No edema.     Left lower leg: No edema.  Skin:    General: Skin is warm and dry.     Capillary Refill: Capillary refill takes less than 2 seconds.     Findings: No ecchymosis, erythema, lesion or wound.  Neurological:     Mental Status: He is alert and oriented to person, place, and time.     GCS: GCS eye subscore is 4. GCS verbal subscore is 5. GCS motor subscore is 6.      Cranial Nerves: Cranial nerves 2-12 are intact.     Sensory: Sensation is intact.     Motor: Motor function is intact. No weakness or abnormal muscle tone.     Coordination: Coordination is intact.  Psychiatric:        Mood and Affect: Mood normal.        Speech: Speech normal.        Behavior: Behavior normal.     ED Results / Procedures / Treatments   Labs (all labs ordered are listed, but only abnormal results are displayed) Labs Reviewed  RESP PANEL BY RT-PCR (FLU A&B, COVID) ARPGX2 - Abnormal; Notable for the following components:      Result Value   SARS Coronavirus 2 by RT PCR POSITIVE (*)    All other components within normal limits  COMPREHENSIVE METABOLIC PANEL - Abnormal; Notable for the following components:   Chloride 97 (*)    Glucose, Bld 109 (*)    All other components within normal limits  CBC WITH DIFFERENTIAL/PLATELET - Abnormal; Notable for the following components:   Lymphs Abs 0.3 (*)    All other components within normal limits  URINALYSIS, ROUTINE W REFLEX MICROSCOPIC - Abnormal; Notable for the following components:   APPearance HAZY (*)    Protein, ur 30 (*)    Leukocytes,Ua LARGE (*)    RBC / HPF >50 (*)    WBC, UA >50 (*)    Bacteria, UA RARE (*)    Non Squamous Epithelial 0-5 (*)    All other components within normal limits  CULTURE, BLOOD (ROUTINE X 2)  CULTURE, BLOOD (ROUTINE X 2)  URINE CULTURE  LACTIC ACID, PLASMA  LACTIC ACID, PLASMA  PROTIME-INR  APTT  TROPONIN I (HIGH SENSITIVITY)    EKG EKG Interpretation  Date/Time:  Thursday September 02 2022 22:22:13 EDT Ventricular Rate:  96 PR Interval:    QRS Duration: 111 QT Interval:  435 QTC Calculation: 550 R Axis:   55 Text Interpretation: Accelerated junctional rhythm Inferior infarct, old Anterior infarct, old Prolonged QT interval No significant change since last tracing Confirmed by Orpah Greek [phone]) on 09/02/2022 11:16:04 PM  Radiology DG Chest Port 1  View  Result Date: 09/02/2022 CLINICAL DATA:  Questionable sepsis - evaluate for abnormality EXAM: PORTABLE CHEST 1 VIEW COMPARISON:  08/29/2009 FINDINGS: Lung volumes are low. Upper normal heart size, post CABG. Diffuse interstitial thickening. No focal airspace disease or large pleural effusion. No acute osseous abnormalities are seen. IMPRESSION: Low lung  volumes with diffuse interstitial thickening, may be pulmonary edema or atypical infection. Electronically Signed   By: Keith Rake M.D.   On: 09/02/2022 23:14    Procedures Procedures    Medications Ordered in ED Medications  acetaminophen (TYLENOL) tablet 650 mg (has no administration in time range)    ED Course/ Medical Decision Making/ A&P                           Medical Decision Making Risk OTC drugs. Prescription drug management.   Presents to the emergency department for evaluation of generalized weakness.  Patient tried to get up earlier today and was so weak that he slumped back to the floor.  No evidence of injury.  Patient noted to have a fever at arrival.  He was unaware of the fever.  He has had a cough for 2 weeks.  Chest x-ray without pneumonia.  Patient is not hypoxic.  Blood work is unremarkable.  No significant leukocytosis.  No lactic acidosis.  No hypotension or signs of sepsis.  COVID test is positive.  Unclear how long he has had COVID as the fever is new today but he has had a cough for a while.  Wife has also had URI symptoms for several days.  Err on the side of caution with Paxlovid prescription.  Recommended admission because the patient has been very weak.  He has declined admission.  He is able to ambulate in the department, given return precautions.        Final Clinical Impression(s) / ED Diagnoses Final diagnoses:  Lower urinary tract infectious disease  COVID-19    Rx / DC Orders ED Discharge Orders          Ordered    nirmatrelvir/ritonavir EUA (PAXLOVID) 20 x 150 MG & 10 x '100MG'$   TABS  2 times daily        09/03/22 0427    cephALEXin (KEFLEX) 500 MG capsule  3 times daily        09/03/22 0427              Orpah Greek, MD 09/03/22 [phone]

## 2022-09-04 LAB — URINE CULTURE

## 2022-09-07 LAB — CULTURE, BLOOD (ROUTINE X 2)
Culture: NO GROWTH
Culture: NO GROWTH
Special Requests: ADEQUATE
Special Requests: ADEQUATE

## 2022-10-05 NOTE — Progress Notes (Signed)
Cardiology Office Note   Date:  10/07/2022   ID:  [name], DOB 10/14/1946, MRN [id number]  PCP:  [name], DO  Cardiologist:   Minus Breeding, MD   Chief Complaint  Patient presents with   Coronary Artery Disease      History of Present Illness: [name] is a 76 y.o. male who presents for follow up of CAD.  He had CABG in July 2010 with a mildly reduced EF.  Since I last saw him he was seen because of chest pain in March.   There was evidence of his prior MI but was similar to previous studies.  He was managed medically.    Since I last saw him he has had no new cardiovascular complaints.  He did take himself off of Lipitor because it made him feel bad.  I see that he had a urinary infection and was in the ED. I do see an EKG which showed sinus tachycardia at that time.  He denies any new cardiovascular complaints.  Does not sound like he is overly active.  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  He denies with his level of activity any new chest discomfort, neck or arm discomfort.  He said no new shortness of breath, PND or orthopnea.  He has had no weight gain or edema.   Past Medical History:  Diagnosis Date   Acid reflux    Carotid artery occlusion    right carotid stenting in 2007   Chest pain    Coronary artery disease    a. 2010 --> cath showing LM and 3-vessel CAD --> s/p CABG with LIMA-LAD, SVG-D1, SVG-OM2 and SVD-PDA   CVA (cerebral vascular accident) ([place])    Hyperlipidemia    Hypertension    Obesity     Past Surgical History:  Procedure Laterality Date   CORONARY ARTERY BYPASS GRAFT     TONSILLECTOMY       Current Outpatient Medications  Medication Sig Dispense Refill   aspirin 81 MG tablet Take 81 mg by mouth daily.       chlorthalidone (HYGROTON) 25 MG tablet Take 1 tablet by mouth once daily 90 tablet 2   citalopram (CELEXA) 20 MG tablet Take 1 tablet (20 mg total) by mouth daily. 90 tablet 3   nitroGLYCERIN (NITROSTAT) 0.4 MG SL tablet Place 1  tablet (0.4 mg total) under the tongue every 5 (five) minutes as needed for chest pain. 25 tablet 3   pravastatin (PRAVACHOL) 80 MG tablet Take 1 tablet (80 mg total) by mouth every evening. 90 tablet 3   vitamin C (ASCORBIC ACID) 500 MG tablet Take 500 mg by mouth daily.     No current facility-administered medications for this visit.    Allergies:   Atorvastatin, Crestor [rosuvastatin calcium], Iodinated contrast media, and Niaspan [niacin er]    ROS:  Please see the history of present illness.   Otherwise, review of systems are positive for none.   All other systems are reviewed and negative.    PHYSICAL EXAM: VS:  BP 110/62   Pulse 72   Ht '5\' 10"'  (1.778 m)   Wt 214 lb (97.1 kg)   BMI 30.71 kg/m  , BMI Body mass index is 30.71 kg/m. GENERAL:  Well appearing NECK:  No jugular venous distention, waveform within normal limits, carotid upstroke brisk and symmetric, no bruits, no thyromegaly LUNGS:  Clear to auscultation bilaterally CHEST:  Unremarkable HEART:  PMI not displaced or  sustained,S1 and S2 within normal limits, no S3, no S4, no clicks, no rubs, no murmurs ABD:  Flat, positive bowel sounds normal in frequency in pitch, no bruits, no rebound, no guarding, no midline pulsatile mass, no hepatomegaly, no splenomegaly EXT:  2 plus pulses throughout, no edema, no cyanosis no clubbing   EKG:  EKG is  ordered today. Sinus rhythm, rate 73, axis within normal limits, possible old inferior infarct, old anteroseptal infarct, no acute ST-T wave changes, nonspecific inferior T wave changes unchanged from previous.  Recent Labs: 09/02/2022: ALT 19; BUN 13; Creatinine, Ser 0.87; Hemoglobin 15.6; Platelets 182; Potassium 3.6; Sodium 136    Lipid Panel    Component Value Date/Time   CHOL 105 11/24/2020 1057   CHOL 120 05/25/2013 0828   TRIG 105 11/24/2020 1057   TRIG 163 (H) 09/10/2013 0854   TRIG 194 (H) 05/25/2013 0828   HDL 36 (L) 11/24/2020 1057   HDL 37 (L) 09/10/2013 0854    HDL 32 (L) 05/25/2013 0828   CHOLHDL 2.9 11/24/2020 1057   CHOLHDL 5.1 03/27/2020 0817   VLDL 27 03/27/2020 0817   LDLCALC 49 11/24/2020 1057   LDLCALC 57 09/10/2013 0854   LDLCALC 49 05/25/2013 0828      Wt Readings from Last 3 Encounters:  10/06/22 214 lb (97.1 kg)  09/02/22 215 lb (97.5 kg)  03/16/22 226 lb (102.5 kg)      Other studies Reviewed: Additional studies/ records that were reviewed today include: None. Review of the above records demonstrates:  Please see elsewhere in the note.     ASSESSMENT AND PLAN:  HTN -    His blood pressure is controlled.  No change in therapy.   CAD, AUTOLOGOUS BYPASS GRAFT -  The patient has no new sypmtoms.  No further cardiovascular testing is indicated.  We will continue with aggressive risk reduction and meds as listed.  HYPERLIPIDEMIA-MIXED -  He is off statin.  He agrees to try to take pravastatin at 80 mg daily.  I will check a lipid profile since he has been off of Lipitor for a while.  I will also check some other labs that were going to be drawn by his primary care to include c-Met, CBC, B12, PSA and TSH.   After he has been on pravastatin for 3 months he needs a repeat lipid profile.   CVA -  He had less than 50% carotid stenosis bilaterally in 2022.  I will follow this clinically.  CARDIOMYPATHY -  He has had a previously mildly reduced ejection fraction.  This was unchanged in 2019 when compared to 2010.  He seems euvolemic.  No change in therapy or further imaging.     Current medicines are reviewed at length with the patient today.  The patient does not have concerns regarding medicines.  The following changes have been made:  None  Labs/ tests ordered today include:  None  Orders Placed This Encounter  Procedures   CBC   Comprehensive metabolic panel   Lipid panel   TSH   PSA   B12   EKG 12-Lead     Disposition:   FU with me in in 12 months   Signed, Minus Breeding, MD  10/07/2022 7:36 AM    [place]

## 2022-10-06 ENCOUNTER — Encounter: Payer: Self-pay | Admitting: Cardiology

## 2022-10-06 ENCOUNTER — Ambulatory Visit: Payer: Medicare Other | Admitting: Cardiology

## 2022-10-06 ENCOUNTER — Other Ambulatory Visit: Payer: Self-pay | Admitting: *Deleted

## 2022-10-06 VITALS — BP 110/62 | HR 72 | Ht 70.0 in | Wt 214.0 lb

## 2022-10-06 DIAGNOSIS — Z0000 Encounter for general adult medical examination without abnormal findings: Secondary | ICD-10-CM | POA: Diagnosis not present

## 2022-10-06 DIAGNOSIS — Z79899 Other long term (current) drug therapy: Secondary | ICD-10-CM

## 2022-10-06 DIAGNOSIS — I2510 Atherosclerotic heart disease of native coronary artery without angina pectoris: Secondary | ICD-10-CM

## 2022-10-06 DIAGNOSIS — I420 Dilated cardiomyopathy: Secondary | ICD-10-CM

## 2022-10-06 DIAGNOSIS — E785 Hyperlipidemia, unspecified: Secondary | ICD-10-CM | POA: Diagnosis not present

## 2022-10-06 DIAGNOSIS — I739 Peripheral vascular disease, unspecified: Secondary | ICD-10-CM | POA: Diagnosis not present

## 2022-10-06 MED ORDER — PRAVASTATIN SODIUM 80 MG PO TABS: 80.0000 mg | ORAL_TABLET | Freq: Every evening | ORAL | 3 refills | Status: DC

## 2022-10-06 NOTE — Patient Instructions (Signed)
Medication Instructions:  Please start Pravastatin 80 mg a day. Continue all other medications as listed.  *If you need a refill on your cardiac medications before your next appointment, please call your pharmacy*   Lab Work: Please have blood work at your closest [facility]. (CMP, CBC, Lipid, TSH, B12 and PSA)  If you have labs (blood work) drawn today and your tests are completely normal, you will receive your results only by: [place] (if you have [place]) OR A paper copy in the mail If you have any lab test that is abnormal or we need to change your treatment, we will call you to review the results.   Follow-Up: At [facility], you and your health needs are our priority.  As part of our continuing mission to provide you with exceptional heart care, we have created designated Provider Care Teams.  These Care Teams include your primary Cardiologist (physician) and Advanced Practice Providers (APPs -  Physician Assistants and Nurse Practitioners) who all work together to provide you with the care you need, when you need it.  We recommend signing up for the patient portal called "MyChart".  Sign up information is provided on this After Visit Summary.  MyChart is used to connect with patients for Virtual Visits (Telemedicine).  Patients are able to view lab/test results, encounter notes, upcoming appointments, etc.  Non-urgent messages can be sent to your provider as well.   To learn more about what you can do with MyChart, go to [email or web address].    Your next appointment:   1 year(s)  The format for your next appointment:   In Person  Provider:   Minus Breeding, MD      Important Information About Sugar

## 2022-10-07 ENCOUNTER — Encounter: Payer: Self-pay | Admitting: Cardiology

## 2022-10-13 DIAGNOSIS — I2510 Atherosclerotic heart disease of native coronary artery without angina pectoris: Secondary | ICD-10-CM | POA: Diagnosis not present

## 2022-10-13 DIAGNOSIS — Z79899 Other long term (current) drug therapy: Secondary | ICD-10-CM | POA: Diagnosis not present

## 2022-10-13 DIAGNOSIS — E785 Hyperlipidemia, unspecified: Secondary | ICD-10-CM | POA: Diagnosis not present

## 2022-10-13 DIAGNOSIS — Z0000 Encounter for general adult medical examination without abnormal findings: Secondary | ICD-10-CM | POA: Diagnosis not present

## 2022-10-13 DIAGNOSIS — I420 Dilated cardiomyopathy: Secondary | ICD-10-CM | POA: Diagnosis not present

## 2022-10-14 LAB — COMPREHENSIVE METABOLIC PANEL
ALT: 16 IU/L (ref 0–44)
AST: 20 IU/L (ref 0–40)
Albumin/Globulin Ratio: 1.8 (ref 1.2–2.2)
Albumin: 4.1 g/dL (ref 3.8–4.8)
Alkaline Phosphatase: 59 IU/L (ref 44–121)
BUN/Creatinine Ratio: 15 (ref 10–24)
BUN: 12 mg/dL (ref 8–27)
Bilirubin Total: 0.6 mg/dL (ref 0.0–1.2)
CO2: 28 mmol/L (ref 20–29)
Calcium: 9.6 mg/dL (ref 8.6–10.2)
Chloride: 101 mmol/L (ref 96–106)
Creatinine, Ser: 0.81 mg/dL (ref 0.76–1.27)
Globulin, Total: 2.3 g/dL (ref 1.5–4.5)
Glucose: 96 mg/dL (ref 70–99)
Potassium: 3.5 mmol/L (ref 3.5–5.2)
Sodium: 143 mmol/L (ref 134–144)
Total Protein: 6.4 g/dL (ref 6.0–8.5)
eGFR: 91 mL/min/{1.73_m2} (ref >59)

## 2022-10-14 LAB — LIPID PANEL
Chol/HDL Ratio: 3.2 ratio (ref 0.0–5.0)
Cholesterol, Total: 109 mg/dL (ref 100–199)
HDL: 34 mg/dL — ABNORMAL LOW (ref >39)
LDL Chol Calc (NIH): 49 mg/dL (ref 0–99)
Triglycerides: 153 mg/dL — ABNORMAL HIGH (ref 0–149)
VLDL Cholesterol Cal: 26 mg/dL (ref 5–40)

## 2022-10-14 LAB — CBC
Hematocrit: 44.8 % (ref 37.5–51.0)
Hemoglobin: 15.2 g/dL (ref 13.0–17.7)
MCH: 31.2 pg (ref 26.6–33.0)
MCHC: 33.9 g/dL (ref 31.5–35.7)
MCV: 92 fL (ref 79–97)
Platelets: 185 10*3/uL (ref 150–450)
RBC: 4.87 x10E6/uL (ref 4.14–5.80)
RDW: 13.1 % (ref 11.6–15.4)
WBC: 7.7 10*3/uL (ref 3.4–10.8)

## 2022-10-14 LAB — VITAMIN B12: Vitamin B-12: 281 pg/mL (ref 232–1245)

## 2022-10-14 LAB — PSA: Prostate Specific Ag, Serum: 3 ng/mL (ref 0.0–4.0)

## 2022-10-14 LAB — TSH: TSH: 1.68 u[IU]/mL (ref 0.450–4.500)

## 2022-10-25 DIAGNOSIS — L820 Inflamed seborrheic keratosis: Secondary | ICD-10-CM | POA: Diagnosis not present

## 2022-10-25 DIAGNOSIS — X32XXXD Exposure to sunlight, subsequent encounter: Secondary | ICD-10-CM | POA: Diagnosis not present

## 2022-10-25 DIAGNOSIS — L570 Actinic keratosis: Secondary | ICD-10-CM | POA: Diagnosis not present

## 2022-10-25 DIAGNOSIS — L821 Other seborrheic keratosis: Secondary | ICD-10-CM | POA: Diagnosis not present

## 2022-11-24 ENCOUNTER — Other Ambulatory Visit: Payer: Self-pay | Admitting: Cardiology

## 2022-12-01 ENCOUNTER — Other Ambulatory Visit: Payer: Self-pay | Admitting: Cardiology

## 2022-12-02 NOTE — Telephone Encounter (Signed)
Spoke with pt, he reports he is taking both atorvastatin and pravastatin since his last appointment with dr hochrein. He was on both medications when the last lab work was drawn. He is not having any issues. Will forward to dr hochrein to find out which he prefers the patient take.

## 2022-12-29 ENCOUNTER — Ambulatory Visit (INDEPENDENT_AMBULATORY_CARE_PROVIDER_SITE_OTHER): Payer: Medicare Other | Admitting: Family Medicine

## 2022-12-29 ENCOUNTER — Encounter: Payer: Self-pay | Admitting: Family Medicine

## 2022-12-29 VITALS — BP 126/63 | HR 78 | Temp 97.8°F | Ht 70.0 in | Wt 214.6 lb

## 2022-12-29 DIAGNOSIS — H6123 Impacted cerumen, bilateral: Secondary | ICD-10-CM

## 2022-12-29 DIAGNOSIS — I739 Peripheral vascular disease, unspecified: Secondary | ICD-10-CM | POA: Diagnosis not present

## 2022-12-29 DIAGNOSIS — F325 Major depressive disorder, single episode, in full remission: Secondary | ICD-10-CM

## 2022-12-29 DIAGNOSIS — I25810 Atherosclerosis of coronary artery bypass graft(s) without angina pectoris: Secondary | ICD-10-CM

## 2022-12-29 DIAGNOSIS — Z0001 Encounter for general adult medical examination with abnormal findings: Secondary | ICD-10-CM

## 2022-12-29 DIAGNOSIS — I10 Essential (primary) hypertension: Secondary | ICD-10-CM

## 2022-12-29 DIAGNOSIS — Z23 Encounter for immunization: Secondary | ICD-10-CM

## 2022-12-29 DIAGNOSIS — N400 Enlarged prostate without lower urinary tract symptoms: Secondary | ICD-10-CM | POA: Diagnosis not present

## 2022-12-29 DIAGNOSIS — Z0000 Encounter for general adult medical examination without abnormal findings: Secondary | ICD-10-CM

## 2022-12-29 MED ORDER — CITALOPRAM HYDROBROMIDE 20 MG PO TABS: 20.0000 mg | ORAL_TABLET | Freq: Every day | ORAL | 3 refills | Status: DC

## 2022-12-29 MED ORDER — CHLORTHALIDONE 25 MG PO TABS: 25.0000 mg | ORAL_TABLET | Freq: Every day | ORAL | 3 refills | Status: DC

## 2022-12-29 NOTE — Progress Notes (Signed)
[name] is a 77 y.o. male presents to office today for annual physical exam examination.    Concerns today include: 1.  Patient has no questions or concerns today.  Occupation: retired, Marital status: married, Substance use: non smoker Diet: typical [place], Exercise: stays active Last eye exam: due 02/2023. Wears glasses Last dental exam: wears false teeth Last colonoscopy: UTD Refills needed today: all Immunizations needed: Tetanus and Shingles (wants to hold off) Immunization History  Administered Date(s) Administered   Fluad Quad(high Dose 65+) 11/24/2020   Influenza, High Dose Seasonal PF 01/09/2018, 10/26/2018, 11/06/2019   Influenza,inj,Quad PF,6+ Mos 10/22/2013, 10/01/2014, 12/17/2015   Influenza-Unspecified 09/26/2021   Moderna Sars-Covid-2 Vaccination 03/01/2020, 04/02/2020, 12/18/2020   Pneumococcal Conjugate-13 10/22/2013   Pneumococcal Polysaccharide-23 11/03/2010   Tdap 09/17/2011     Past Medical History:  Diagnosis Date   Acid reflux    Carotid artery occlusion    right carotid stenting in 2007   Chest pain    CHEST PAIN-UNSPECIFIED 06/20/2009   Qualifier: Diagnosis of   By: Orville Govern, CMA, Carol       Coronary artery disease    a. 2010 --> cath showing LM and 3-vessel CAD --> s/p CABG with LIMA-LAD, SVG-D1, SVG-OM2 and SVD-PDA   CVA (cerebral vascular accident) ([place])    Hyperlipidemia    Hypertension    Obesity    Social History   Socioeconomic History   Marital status: Married    Spouse name: [name]   Number of children: 2   Years of education: Not on file   Highest education level: Not on file  Occupational History   Occupation: Retired  Tobacco Use   Smoking status: Former    Packs/day: 1.50    Years: 41.00    Total pack years: 61.50    Types: Cigarettes    Start date: 02/04/1965    Quit date: 12/27/2005    Years since quitting: 17.0   Smokeless tobacco: Never  Vaping Use   Vaping Use: Never used  Substance and Sexual Activity    Alcohol use: No    Alcohol/week: 0.0 standard drinks of alcohol   Drug use: No   Sexual activity: Not Currently  Other Topics Concern   Not on file  Social History Narrative   Daughter lives with them   Son lives in [place] Strain: [place]  (12/30/2021)   Overall Financial Resource Strain (CARDIA)    Difficulty of Paying Living Expenses: Not hard at all  Food Insecurity: No Food Insecurity (12/30/2021)   Hunger Vital Sign    Worried About Estate manager/land agent of Food in the Last Year: Never true    Ran Out of Food in the Last Year: Never true  Transportation Needs: No Transportation Needs (12/30/2021)   PRAPARE - Hydrologist (Medical): No    Lack of Transportation (Non-Medical): No  Physical Activity: Insufficiently Active (12/30/2021)   Exercise Vital Sign    Days of Exercise per Week: 7 days    Minutes of Exercise per Session: 10 min  Stress: No Stress Concern Present (12/30/2021)   [place]    Feeling of Stress : Not at all  Social Connections: [place] (12/30/2021)   Social Connection and Isolation Panel [NHANES]    Frequency of Communication with Friends and Family: More than three times a week    Frequency of Social Gatherings with  Friends and Family: More than three times a week    Attends Religious Services: More than 4 times per year    Active Member of Clubs or Organizations: Yes    Attends Music therapist: More than 4 times per year    Marital Status: Married  Human resources officer Violence: Not At Risk (12/30/2021)   Humiliation, Afraid, Rape, and Kick questionnaire    Fear of Current or Ex-Partner: No    Emotionally Abused: No    Physically Abused: No    Sexually Abused: No   Past Surgical History:  Procedure Laterality Date   CORONARY ARTERY BYPASS GRAFT     TONSILLECTOMY     Family History  Problem  Relation Age of Onset   Heart attack Father    Heart attack Brother     Current Outpatient Medications:    aspirin 81 MG tablet, Take 81 mg by mouth daily.  , Disp: , Rfl:    chlorthalidone (HYGROTON) 25 MG tablet, Take 1 tablet by mouth once daily, Disp: 90 tablet, Rfl: 2   citalopram (CELEXA) 20 MG tablet, Take 1 tablet (20 mg total) by mouth daily., Disp: 90 tablet, Rfl: 3   nitroGLYCERIN (NITROSTAT) 0.4 MG SL tablet, Place 1 tablet (0.4 mg total) under the tongue every 5 (five) minutes as needed for chest pain., Disp: 25 tablet, Rfl: 3   pravastatin (PRAVACHOL) 80 MG tablet, Take 1 tablet (80 mg total) by mouth every evening., Disp: 90 tablet, Rfl: 3   vitamin C (ASCORBIC ACID) 500 MG tablet, Take 500 mg by mouth daily., Disp: , Rfl:   Allergies  Allergen Reactions   Atorvastatin Other (See Comments)    Muscle aches   Crestor [Rosuvastatin Calcium] Other (See Comments)    Muscle aches   Iodinated Contrast Media Rash    rash   Niaspan [Niacin Er] Other (See Comments)    Muscle aches     ROS: Review of Systems A comprehensive review of systems was negative except for: Eyes: positive for contacts/glasses Ears, nose, mouth, throat, and face: positive for hearing loss Integument/breast: positive for skin lesion(s) Musculoskeletal: positive for LLE leg length discrepancy, uses heel lift    Physical exam BP 126/63   Pulse 78   Temp 97.8 F (36.6 C)   Ht '5\' 10"'$  (1.778 m)   Wt 214 lb 9.6 oz (97.3 kg)   SpO2 95%   BMI 30.79 kg/m  General appearance: alert, cooperative, and appears stated age Head: Normocephalic, without obvious abnormality, atraumatic Eyes: negative findings: lids and lashes normal, conjunctivae and sclerae normal, corneas clear, and pupils equal, round, reactive to light and accomodation Ears:  TMs obscured by cerumen bilaterally Nose: Nares normal. Septum midline. Mucosa normal. No drainage or sinus tenderness. Throat: lips, mucosa, and tongue normal; false  teeth and gums normal Neck: no adenopathy, supple, symmetrical, trachea midline, and thyroid not enlarged, symmetric, no tenderness/mass/nodules Back: symmetric, no curvature. ROM normal. No CVA tenderness. Lungs: clear to auscultation bilaterally Chest wall: no tenderness Heart: regular rate and rhythm, S1, S2 normal, no murmur, click, rub or gallop Abdomen: soft, non-tender; bowel sounds normal; no masses,  no organomegaly Extremities: LLE shorter than right, atraumatic, no cyanosis or edema Pulses: 2+ and symmetric Skin:  Multiple seborrheic keratoses noted along the back Lymph nodes: Cervical, supraclavicular, and axillary nodes normal. Neurologic: Grossly normal Psych: Mood stable, speech normal, affect appropriate.  Very playful and Surveyor, quantity Office Visit from 12/29/2022 in [place]  PHQ-2 Total Score 0         Assessment/ Plan: [name] here for annual physical exam.   Annual physical exam  Need for tetanus booster  Coronary atherosclerosis of autologous vein bypass graft without angina  Benign prostatic hyperplasia without lower urinary tract symptoms - Plan: PSA  Major depressive disorder with single episode, in full remission ([place]) - Plan: citalopram (CELEXA) 20 MG tablet  Essential hypertension - Plan: chlorthalidone (HYGROTON) 25 MG tablet  PVD (peripheral vascular disease) ([place])  Bilateral impacted cerumen  Tetanus booster administered.  We will hold off on shingles.  He is up-to-date on review of health care otherwise.  I reviewed his most recent cardiovascular evaluation with Dr. Percival Spanish as well as all lab results.    He did have a 3 fold increase in PSA despite it still be within normal range so I am going to repeat this to ensure that we are not having some type of abnormal trajectory.  He is not having any symptoms.  I have renewed his medications  He is ears were irrigated successfully  today.  Counseled on healthy lifestyle choices, including diet (rich in fruits, vegetables and lean meats and low in salt and simple carbohydrates) and exercise (at least 30 minutes of moderate physical activity daily).  Patient to follow up in 1 year for annual exam or sooner if needed.  Katalea Ucci M. Lajuana Ripple, DO

## 2022-12-29 NOTE — Patient Instructions (Signed)
Preventive Care [name], [name] Preventive care refers to lifestyle choices and visits with your health care provider that can promote health and wellness. Preventive care visits are also called wellness exams. What can I expect for my preventive care visit? Counseling During your preventive care visit, your health care provider may ask about your: Medical history, including: Past medical problems. Family medical history. History of falls. Current health, including: Emotional well-being. Home life and relationship well-being. Sexual activity. Memory and ability to understand (cognition). Lifestyle, including: Alcohol, nicotine or tobacco, and drug use. Access to firearms. Diet, exercise, and sleep habits. Work and work environment. Sunscreen use. Safety issues such as seatbelt and bike helmet use. Physical exam Your health care provider will check your: Height and weight. These may be used to calculate your BMI (body mass index). BMI is a measurement that tells if you are at a healthy weight. Waist circumference. This measures the distance around your waistline. This measurement also tells if you are at a healthy weight and may help predict your risk of certain diseases, such as type 2 diabetes and high blood pressure. Heart rate and blood pressure. Body temperature. Skin for abnormal spots. What immunizations do I need?  Vaccines are usually given at various ages, according to a schedule. Your health care provider will recommend vaccines for you based on your age, medical history, and lifestyle or other factors, such as travel or where you work. What tests do I need? Screening Your health care provider may recommend screening tests for certain conditions. This may include: Lipid and cholesterol levels. Diabetes screening. This is done by checking your blood sugar (glucose) after you have not eaten for a while (fasting). Hepatitis C test. Hepatitis B test. HIV (human  immunodeficiency virus) test. STI (sexually transmitted infection) testing, if you are at risk. Lung cancer screening. Colorectal cancer screening. Prostate cancer screening. Abdominal aortic aneurysm (AAA) screening. You may need this if you are a current or former smoker. Talk with your health care provider about your test results, treatment options, and if necessary, the need for more tests. Follow these instructions at home: Eating and drinking  Eat a diet that includes fresh fruits and vegetables, whole grains, lean protein, and low-fat dairy products. Limit your intake of foods with high amounts of sugar, saturated fats, and salt. Take vitamin and mineral supplements as recommended by your health care provider. Do not drink alcohol if your health care provider tells you not to drink. If you drink alcohol: Limit how much you have to 0-2 drinks a day. Know how much alcohol is in your drink. In the [place] one drink equals one 12 oz bottle of beer (355 mL), one 5 oz glass of wine (148 mL), or one 1 oz glass of hard liquor (44 mL). Lifestyle Brush your teeth every morning and night with fluoride toothpaste. Floss one time each day. Exercise for at least 30 minutes 5 or more days each week. Do not use any products that contain nicotine or tobacco. These products include cigarettes, chewing tobacco, and vaping devices, such as e-cigarettes. If you need help quitting, ask your health care provider. Do not use drugs. If you are sexually active, practice safe sex. Use a condom or other form of protection to prevent STIs. Take aspirin only as told by your health care provider. Make sure that you understand how much to take and what form to take. Work with your health care provider to find out whether it is safe   and beneficial for you to take aspirin daily. Ask your health care provider if you need to take a cholesterol-lowering medicine (statin). Find healthy ways to manage stress, such  as: Meditation, yoga, or listening to music. Journaling. Talking to a trusted person. Spending time with friends and family. Safety Always wear your seat belt while driving or riding in a vehicle. Do not drive: If you have been drinking alcohol. Do not ride with someone who has been drinking. When you are tired or distracted. While texting. If you have been using any mind-altering substances or drugs. Wear a helmet and other protective equipment during sports activities. If you have firearms in your house, make sure you follow all gun safety procedures. Minimize exposure to UV radiation to reduce your risk of skin cancer. What's next? Visit your health care provider once a year for an annual wellness visit. Ask your health care provider how often you should have your eyes and teeth checked. Stay up to date on all vaccines. This information is not intended to replace advice given to you by your health care provider. Make sure you discuss any questions you have with your health care provider. Document Revised: 06/10/2021 Document Reviewed: 06/10/2021 Elsevier Patient Education  2023 [employer]  

## 2023-01-03 ENCOUNTER — Ambulatory Visit (INDEPENDENT_AMBULATORY_CARE_PROVIDER_SITE_OTHER): Payer: Medicare Other

## 2023-01-03 VITALS — Ht 70.0 in | Wt 210.0 lb

## 2023-01-03 DIAGNOSIS — Z0000 Encounter for general adult medical examination without abnormal findings: Secondary | ICD-10-CM

## 2023-01-03 NOTE — Patient Instructions (Signed)
[name] , Thank you for taking time to come for your Medicare Wellness Visit. I appreciate your ongoing commitment to your health goals. Please review the following plan we discussed and let me know if I can assist you in the future.   These are the goals we discussed:  Goals      Exercise 3x per week (30 min per time)     Try to exercise for 30 minutes 3 times weekly      Have 3 meals a day     Eat 3 meals daily that consist of lean proteins, fruits and vegetables.        This is a list of the screening recommended for you and due dates:  Health Maintenance  Topic Date Due   Zoster (Shingles) Vaccine (1 of 2) Never done   COVID-19 Vaccine (4 - 2023-24 season) 01/14/2023*   Flu Shot  03/27/2023*   Medicare Annual Wellness Visit  01/04/2024   DTaP/Tdap/Td vaccine (3 - Td or Tdap) 12/29/2032   Pneumonia Vaccine  Completed   Hepatitis C Screening: USPSTF Recommendation to screen - Ages 78-79 yo.  Completed   HPV Vaccine  Aged Out   Colon Cancer Screening  Discontinued  *Topic was postponed. The date shown is not the original due date.    Advanced directives: Advance directive discussed with you today. I have provided a copy for you to complete at home and have notarized. Once this is complete please bring a copy in to our office so we can scan it into your chart.   Conditions/risks identified: Aim for 30 minutes of exercise or brisk walking, 6-8 glasses of water, and 5 servings of fruits and vegetables each day.   Next appointment: Follow up in one year for your annual wellness visit.   Preventive Care 56 Years and Older, Male  Preventive care refers to lifestyle choices and visits with your health care provider that can promote health and wellness. What does preventive care include? A yearly physical exam. This is also called an annual well check. Dental exams once or twice a year. Routine eye exams. Ask your health care provider how often you should have your eyes  checked. Personal lifestyle choices, including: Daily care of your teeth and gums. Regular physical activity. Eating a healthy diet. Avoiding tobacco and drug use. Limiting alcohol use. Practicing safe sex. Taking low doses of aspirin every day. Taking vitamin and mineral supplements as recommended by your health care provider. What happens during an annual well check? The services and screenings done by your health care provider during your annual well check will depend on your age, overall health, lifestyle risk factors, and family history of disease. Counseling  Your health care provider may ask you questions about your: Alcohol use. Tobacco use. Drug use. Emotional well-being. Home and relationship well-being. Sexual activity. Eating habits. History of falls. Memory and ability to understand (cognition). Work and work Statistician. Screening  You may have the following tests or measurements: Height, weight, and BMI. Blood pressure. Lipid and cholesterol levels. These may be checked every 5 years, or more frequently if you are over 82 years old. Skin check. Lung cancer screening. You may have this screening every year starting at age 65 if you have a 30-pack-year history of smoking and currently smoke or have quit within the past 15 years. Fecal occult blood test (FOBT) of the stool. You may have this test every year starting at age 39. Flexible sigmoidoscopy or colonoscopy. You  may have a sigmoidoscopy every 5 years or a colonoscopy every 10 years starting at age 75. Prostate cancer screening. Recommendations will vary depending on your family history and other risks. Hepatitis C blood test. Hepatitis B blood test. Sexually transmitted disease (STD) testing. Diabetes screening. This is done by checking your blood sugar (glucose) after you have not eaten for a while (fasting). You may have this done every 1-3 years. Abdominal aortic aneurysm (AAA) screening. You may need this  if you are a current or former smoker. Osteoporosis. You may be screened starting at age 42 if you are at high risk. Talk with your health care provider about your test results, treatment options, and if necessary, the need for more tests. Vaccines  Your health care provider may recommend certain vaccines, such as: Influenza vaccine. This is recommended every year. Tetanus, diphtheria, and acellular pertussis (Tdap, Td) vaccine. You may need a Td booster every 10 years. Zoster vaccine. You may need this after age 6. Pneumococcal 13-valent conjugate (PCV13) vaccine. One dose is recommended after age 19. Pneumococcal polysaccharide (PPSV23) vaccine. One dose is recommended after age 41. Talk to your health care provider about which screenings and vaccines you need and how often you need them. This information is not intended to replace advice given to you by your health care provider. Make sure you discuss any questions you have with your health care provider. Document Released: 01/09/2016 Document Revised: 09/01/2016 Document Reviewed: 10/14/2015 Elsevier Interactive Patient Education  2017 [employer] Prevention in the Home Falls can cause injuries. They can happen to people of all ages. There are many things you can do to make your home safe and to help prevent falls. What can I do on the outside of my home? Regularly fix the edges of walkways and driveways and fix any cracks. Remove anything that might make you trip as you walk through a door, such as a raised step or threshold. Trim any bushes or trees on the path to your home. Use bright outdoor lighting. Clear any walking paths of anything that might make someone trip, such as rocks or tools. Regularly check to see if handrails are loose or broken. Make sure that both sides of any steps have handrails. Any raised decks and porches should have guardrails on the edges. Have any leaves, snow, or ice cleared regularly. Use sand  or salt on walking paths during winter. Clean up any spills in your garage right away. This includes oil or grease spills. What can I do in the bathroom? Use night lights. Install grab bars by the toilet and in the tub and shower. Do not use towel bars as grab bars. Use non-skid mats or decals in the tub or shower. If you need to sit down in the shower, use a plastic, non-slip stool. Keep the floor dry. Clean up any water that spills on the floor as soon as it happens. Remove soap buildup in the tub or shower regularly. Attach bath mats securely with double-sided non-slip rug tape. Do not have throw rugs and other things on the floor that can make you trip. What can I do in the bedroom? Use night lights. Make sure that you have a light by your bed that is easy to reach. Do not use any sheets or blankets that are too big for your bed. They should not hang down onto the floor. Have a firm chair that has side arms. You can use this for support while you get  dressed. Do not have throw rugs and other things on the floor that can make you trip. What can I do in the kitchen? Clean up any spills right away. Avoid walking on wet floors. Keep items that you use a lot in easy-to-reach places. If you need to reach something above you, use a strong step stool that has a grab bar. Keep electrical cords out of the way. Do not use floor polish or wax that makes floors slippery. If you must use wax, use non-skid floor wax. Do not have throw rugs and other things on the floor that can make you trip. What can I do with my stairs? Do not leave any items on the stairs. Make sure that there are handrails on both sides of the stairs and use them. Fix handrails that are broken or loose. Make sure that handrails are as long as the stairways. Check any carpeting to make sure that it is firmly attached to the stairs. Fix any carpet that is loose or worn. Avoid having throw rugs at the top or bottom of the stairs.  If you do have throw rugs, attach them to the floor with carpet tape. Make sure that you have a light switch at the top of the stairs and the bottom of the stairs. If you do not have them, ask someone to add them for you. What else can I do to help prevent falls? Wear shoes that: Do not have high heels. Have rubber bottoms. Are comfortable and fit you well. Are closed at the toe. Do not wear sandals. If you use a stepladder: Make sure that it is fully opened. Do not climb a closed stepladder. Make sure that both sides of the stepladder are locked into place. Ask someone to hold it for you, if possible. Clearly mark and make sure that you can see: Any grab bars or handrails. First and last steps. Where the edge of each step is. Use tools that help you move around (mobility aids) if they are needed. These include: Canes. Walkers. Scooters. Crutches. Turn on the lights when you go into a dark area. Replace any light bulbs as soon as they burn out. Set up your furniture so you have a clear path. Avoid moving your furniture around. If any of your floors are uneven, fix them. If there are any pets around you, be aware of where they are. Review your medicines with your doctor. Some medicines can make you feel dizzy. This can increase your chance of falling. Ask your doctor what other things that you can do to help prevent falls. This information is not intended to replace advice given to you by your health care provider. Make sure you discuss any questions you have with your health care provider. Document Released: 10/09/2009 Document Revised: 05/20/2016 Document Reviewed: 01/17/2015 Elsevier Interactive Patient Education  2017 [employer].

## 2023-01-03 NOTE — Progress Notes (Signed)
Subjective:   [name] is a 77 y.o. male who presents for Medicare Annual/Subsequent preventive examination. I connected with  [name] on 01/03/23 by a audio enabled telemedicine application and verified that I am speaking with the correct person using two identifiers.  Patient Location: Home  Provider Location: Home Office  I discussed the limitations of evaluation and management by telemedicine. The patient expressed understanding and agreed to proceed.  Review of Systems     Cardiac Risk Factors include: advanced age (>100mn, >>60women);male gender;hypertension;dyslipidemia     Objective:    Today's Vitals   01/03/23 1402  Weight: 210 lb (95.3 kg)  Height: '5\' 10"'$  (1.778 m)   Body mass index is 30.13 kg/m.     01/03/2023    2:05 PM 12/30/2021    3:55 PM  Advanced Directives  Does Patient Have a Medical Advance Directive? No No  Would patient like information on creating a medical advance directive? No - Patient declined No - Patient declined    Current Medications (verified) Outpatient Encounter Medications as of 01/03/2023  Medication Sig   aspirin 81 MG tablet Take 81 mg by mouth daily.     b complex vitamins capsule Take 1 capsule by mouth daily.   chlorthalidone (HYGROTON) 25 MG tablet Take 1 tablet (25 mg total) by mouth daily.   cholecalciferol (VITAMIN D3) 25 MCG (1000 UNIT) tablet Take 1,000 Units by mouth daily.   citalopram (CELEXA) 20 MG tablet Take 1 tablet (20 mg total) by mouth daily.   nitroGLYCERIN (NITROSTAT) 0.4 MG SL tablet Place 1 tablet (0.4 mg total) under the tongue every 5 (five) minutes as needed for chest pain.   pravastatin (PRAVACHOL) 80 MG tablet Take 1 tablet (80 mg total) by mouth every evening.   vitamin C (ASCORBIC ACID) 500 MG tablet Take 500 mg by mouth daily.   No facility-administered encounter medications on file as of 01/03/2023.    Allergies (verified) Atorvastatin, Crestor [rosuvastatin calcium], Iodinated contrast  media, and Niaspan [niacin er]   History: Past Medical History:  Diagnosis Date   Acid reflux    Carotid artery occlusion    right carotid stenting in 2007   Chest pain    CHEST PAIN-UNSPECIFIED 06/20/2009   Qualifier: Diagnosis of   By: FOrville Govern CMA, Carol       Coronary artery disease    a. 2010 --> cath showing LM and 3-vessel CAD --> s/p CABG with LIMA-LAD, SVG-D1, SVG-OM2 and SVD-PDA   CV[place]ce[place]) [place]    Hyperlipidemia    Hypertension    Obesity    Past Surgical History:  Procedure Laterality Date   CORONARY ARTERY BYPASS GRAFT     TONSILLECTOMY     Family History  Problem Relation Age of Onset   Heart attack Father    Heart attack Brother    Social History   Socioeconomic History   Marital status: Married    Spouse name: [name]  Number of children: 2   Years of education: Not on file   Highest education level: Not on file  Occupational History   Occupation: Retired  Tobacco Use   Smoking status: Former    Packs/day: 1.50    Years: 41.00    Total pack years: 61.50    Types: Cigarettes    Start date: 02/04/1965    Quit date: 12/27/2005    Years since quitting: 17.0   Smokeless tobacco: Never  Vaping Use   Vaping  Use: Never used  Substance and Sexual Activity   Alcohol use: No    Alcohol/week: 0.0 standard drinks of alcohol   Drug use: No   Sexual activity: Not Currently  Other Topics Concern   Not on file  Social History Narrative   Daughter lives with them   Son lives in [place] Strain: Low Risk  (01/03/2023)   Overall Financial Resource Strain (CARDIA)    Difficulty of Paying Living Expenses: Not hard at all  Food Insecurity: No Food Insecurity (01/03/2023)   Hunger Vital Sign    Worried About Running Out of Food in the Last Year: Never true    Ran Out of Food in the Last Year: Never true  Transportation Needs: No Transportation Needs (01/03/2023)   PRAPARE -  Hydrologist (Medical): No    Lack of Transportation (Non-Medical): No  Physical Activity: Inactive (01/03/2023)   Exercise Vital Sign    Days of Exercise per Week: 0 days    Minutes of Exercise per Session: 0 min  Stress: No Stress Concern Present (01/03/2023)   [place]    Feeling of Stress : Not at all  Social Connections: [place] (01/03/2023)   Social Connection and Isolation Panel [NHANES]    Frequency of Communication with Friends and Family: More than three times a week    Frequency of Social Gatherings with Friends and Family: More than three times a week    Attends Religious Services: More than 4 times per year    Active Member of [employer] or Organizations: Yes    Attends Music therapist: More than 4 times per year    Marital Status: Married    Tobacco Counseling Counseling given: Not Answered   Clinical Intake:                 Diabetic?no          Activities of Daily Living    01/03/2023    2:05 PM  In your present state of health, do you have any difficulty performing the following activities:  Hearing? 0  Vision? 0  Difficulty concentrating or making decisions? 0  Walking or climbing stairs? 0  Dressing or bathing? 0  Doing errands, shopping? 0  Preparing Food and eating ? N  Using the Toilet? N  In the past six months, have you accidently leaked urine? N  Do you have problems with loss of bowel control? N  Managing your Medications? N  Managing your Finances? N  Housekeeping or managing your Housekeeping? N    Patient Care Team: [name], DO as PCP - General (Family Medicine) Minus Breeding, MD as PCP - Cardiology (Cardiology) Allyn Kenner, MD (Dermatology)  Indicate any recent Medical Services you may have received from other than [facility] providers in the past year (date may be approximate).     Assessment:   This  is a routine wellness examination for [name].  Hearing/Vision screen Vision Screening - Comments:: Wears rx glasses - up to date with routine eye exams with  Dr.Lee  Dietary issues and exercise activities discussed: Current Exercise Habits: The patient does not participate in regular exercise at present   Goals Addressed             This Visit's Progress    Have 3 meals a day   On track    Eat  3 meals daily that consist of lean proteins, fruits and vegetables.       Depression Screen    01/03/2023    2:04 PM 12/29/2022    2:08 PM 03/16/2022    2:14 PM 12/30/2021    3:41 PM 11/24/2020   10:21 AM 08/13/2020    9:47 AM 08/10/2019   10:03 AM  PHQ 2/9 Scores  PHQ - 2 Score 0 0 0 0 0 0 0  PHQ- 9 Score 0 0 1  0  0    Fall Risk    01/03/2023    2:03 PM 12/29/2022    2:14 PM 12/29/2022    2:08 PM 03/16/2022    2:15 PM 12/30/2021    3:55 PM  Fall Risk   Falls in the past year? 0 0 0 0 0  Number falls in past yr: 0    0  Injury with Fall? 0    0  Risk for fall due to : No Fall Risks    Mental status change  Follow up Falls prevention discussed    Falls prevention discussed    FALL RISK PREVENTION PERTAINING TO THE HOME:  Any stairs in or around the home? Yes  If so, are there any without handrails? No  Home free of loose throw rugs in walkways, pet beds, electrical cords, etc? Yes  Adequate lighting in your home to reduce risk of falls? Yes   ASSISTIVE DEVICES UTILIZED TO PREVENT FALLS:  Life alert? No  Use of a cane, walker or w/c? No  Grab bars in the bathroom? Yes  Shower chair or bench in shower? Yes  Elevated toilet seat or a handicapped toilet? Yes       03/16/2022    3:09 PM 05/02/2018    9:16 AM  MMSE - Mini Mental State Exam  Orientation to time 4 5  Orientation to Place 5 5  Registration 3 3  Attention/ Calculation 4 5  Recall 3 3  Language- name 2 objects 2 2  Language- repeat 1 1  Language- follow 3 step command 3 3  Language- read & follow direction 1 1   Write a sentence 1 1  Copy design 1 1  Total score 28 30        01/03/2023    2:05 PM 12/30/2021    3:51 PM  6CIT Screen  What Year? 0 points 0 points  What month? 0 points 0 points  What time? 0 points 0 points  Count back from 20 0 points 0 points  Months in reverse 0 points 4 points  Repeat phrase 0 points 2 points  Total Score 0 points 6 points    Immunizations Immunization History  Administered Date(s) Administered   Fluad Quad(high Dose 65+) 11/24/2020   Influenza, High Dose Seasonal PF 01/09/2018, 10/26/2018, 11/06/2019   Influenza,inj,Quad PF,6+ Mos 10/22/2013, 10/01/2014, 12/17/2015   Influenza-Unspecified 01/09/2018, 10/26/2018, 11/06/2019, 09/26/2021   Moderna Sars-Covid-2 Vaccination 03/01/2020, 04/02/2020, 12/18/2020   Pneumococcal Conjugate-13 10/22/2013   Pneumococcal Polysaccharide-23 11/03/2010   Td 12/29/2022   Tdap 09/17/2011    TDAP status: Up to date  Flu Vaccine status: Up to date  Pneumococcal vaccine status: Up to date  Covid-19 vaccine status: Completed vaccines  Qualifies for Shingles Vaccine? Yes   Zostavax completed No   Shingrix Completed?: No.    Education has been provided regarding the importance of this vaccine. Patient has been advised to call insurance company to determine out of pocket expense if they  have not yet received this vaccine. Advised may also receive vaccine at local pharmacy or Health Dept. Verbalized acceptance and understanding.  Screening Tests Health Maintenance  Topic Date Due   Zoster Vaccines- Shingrix (1 of 2) Never done   COVID-19 Vaccine (4 - 2023-24 season) 01/14/2023 (Originally 08/27/2022)   INFLUENZA VACCINE  03/27/2023 (Originally 07/27/2022)   Medicare Annual Wellness (AWV)  01/04/2024   DTaP/Tdap/Td (3 - Td or Tdap) 12/29/2032   Pneumonia Vaccine 46+ Years old  Completed   Hepatitis C Screening  Completed   HPV VACCINES  Aged Out   COLONOSCOPY (Pts 45-1yr Insurance coverage will need to be confirmed)   Discontinued    Health Maintenance  Health Maintenance Due  Topic Date Due   Zoster Vaccines- Shingrix (1 of 2) Never done    Colorectal cancer screening: No longer required.   Lung Cancer Screening: (Low Dose CT Chest recommended if Age 78-80years, 30 pack-year currently smoking OR have quit w/in 15years.) does not qualify.   Lung Cancer Screening Referral: n/a  Additional Screening:  Hepatitis C Screening: does not qualify;   Vision Screening: Recommended annual ophthalmology exams for early detection of glaucoma and other disorders of the eye. Is the patient up to date with their annual eye exam?  Yes  Who is the provider or what is the name of the office in which the patient attends annual eye exams? Dr.Lee  If pt is not established with a provider, would they like to be referred to a provider to establish care? No .   Dental Screening: Recommended annual dental exams for proper oral hygiene  Community Resource Referral / Chronic Care Management: CRR required this visit?  No   CCM required this visit?  No      Plan:     I have personally reviewed and noted the following in the patient's chart:   Medical and social history Use of alcohol, tobacco or illicit drugs  Current medications and supplements including opioid prescriptions. Patient is not currently taking opioid prescriptions. Functional ability and status Nutritional status Physical activity Advanced directives List of other physicians Hospitalizations, surgeries, and ER visits in previous 12 months Vitals Screenings to include cognitive, depression, and falls Referrals and appointments  In addition, I have reviewed and discussed with patient certain preventive protocols, quality metrics, and best practice recommendations. A written personalized care plan for preventive services as well as general preventive health recommendations were provided to patient.     LDaphane Shepherd LPN   16/01/3761   Nurse Notes: Due Flu Vaccine

## 2023-04-02 ENCOUNTER — Other Ambulatory Visit: Payer: Self-pay | Admitting: Family Medicine

## 2023-04-02 DIAGNOSIS — I25810 Atherosclerosis of coronary artery bypass graft(s) without angina pectoris: Secondary | ICD-10-CM

## 2023-06-20 ENCOUNTER — Other Ambulatory Visit: Payer: Self-pay | Admitting: Family Medicine

## 2023-06-20 DIAGNOSIS — I25810 Atherosclerosis of coronary artery bypass graft(s) without angina pectoris: Secondary | ICD-10-CM

## 2023-07-15 DIAGNOSIS — B370 Candidal stomatitis: Secondary | ICD-10-CM | POA: Diagnosis not present

## 2023-07-15 DIAGNOSIS — R070 Pain in throat: Secondary | ICD-10-CM | POA: Diagnosis not present

## 2023-10-25 ENCOUNTER — Other Ambulatory Visit: Payer: Self-pay | Admitting: Cardiology

## 2023-11-13 NOTE — Progress Notes (Unsigned)
  Cardiology Office Note:   Date:  11/16/2023  ID:  FABRIZIO HOOEY, DOB October 24, 1946, MRN [id number] PCP: Raliegh Ip, DO  [facility] Providers Cardiologist:  Rollene Rotunda, MD {  History of Present Illness:   [name] is a 77 y.o. male who presents for follow up of CAD.  He had CABG in July 2010 with a mildly reduced EF.  Since I last saw him he has had no new cardiovascular problems.  He still has [employer] as a hobby.  He denies any cardiovascular symptoms with this.  The patient denies any new symptoms such as chest discomfort, neck or arm discomfort. There has been no new shortness of breath, PND or orthopnea. There have been no reported palpitations, presyncope or syncope.     ROS:   As stated in the HPI and negative for all other systems.  Studies Reviewed:    EKG:   EKG Interpretation Date/Time:  Wednesday November 16 2023 11:58:07 EST Ventricular Rate:  50 PR Interval:  338 QRS Duration:  112 QT Interval:  432 QTC Calculation: 393 R Axis:   84  Text Interpretation: Sinus bradycardia with 1st degree A-V block Possible SA block with PVCs and sinus arrhythmia Cannot rule out Inferior infarct , age undetermined Anteroseptal infarct , age undetermined When compared with ECG of 02-Sep-2022 22:22,  Rate is slower with possible SA block. Confirmed by Rollene Rotunda ([id number]) on 11/16/2023 1:00:32 PM     Risk Assessment/Calculations:              Physical Exam:   VS:  BP 122/66   Pulse (!) 50   Ht 5\' 10"  (1.778 m)   Wt 210 lb (95.3 kg)   BMI 30.13 kg/m    Wt Readings from Last 3 Encounters:  11/16/23 210 lb (95.3 kg)  01/03/23 210 lb (95.3 kg)  12/29/22 214 lb 9.6 oz (97.3 kg)     GEN: Well nourished, well developed in no acute distress NECK: No JVD; No carotid bruits CARDIAC: RRR, no murmurs, rubs, gallops RESPIRATORY:  Clear to auscultation without rales, wheezing or rhonchi  ABDOMEN: Soft, non-tender, non-distended EXTREMITIES:  No edema; No  deformity   ASSESSMENT AND PLAN:    HTN -    His blood pressure is at target.  No change in therapy.   CAD, AUTOLOGOUS BYPASS GRAFT -  The patient has no new sypmtoms.  No further cardiovascular testing is indicated.  We will continue with aggressive risk reduction and meds as listed.   HYPERLIPIDEMIA-MIXED -  He is tolerating pravastatin.  Last year his LDL was 49.  I will repeat this this year.  Goals of therapy will be an LDL in the 50s.  CVA -  He had less than 50% carotid stenosis bilaterally in 2022.  I will order follow up Doppler.     CARDIOMYPATHY -  He has had a previously mildly reduced ejection fraction.  I will follow this clinically.  No imaging at this point.   BRADYCARDIA - He has no symptoms but he does have some bradycardia arrhythmia that has progressed.  I am going to put a 3-day monitor on him.  Follow up with me in one year.   Signed, Rollene Rotunda, MD

## 2023-11-16 ENCOUNTER — Ambulatory Visit: Payer: Medicare Other | Admitting: Cardiology

## 2023-11-16 ENCOUNTER — Encounter: Payer: Self-pay | Admitting: Cardiology

## 2023-11-16 ENCOUNTER — Other Ambulatory Visit: Payer: Self-pay | Admitting: *Deleted

## 2023-11-16 ENCOUNTER — Ambulatory Visit: Payer: Medicare Other | Attending: Cardiology

## 2023-11-16 VITALS — BP 122/66 | HR 50 | Ht 70.0 in | Wt 210.0 lb

## 2023-11-16 DIAGNOSIS — R001 Bradycardia, unspecified: Secondary | ICD-10-CM

## 2023-11-16 DIAGNOSIS — I255 Ischemic cardiomyopathy: Secondary | ICD-10-CM | POA: Diagnosis not present

## 2023-11-16 DIAGNOSIS — Z79899 Other long term (current) drug therapy: Secondary | ICD-10-CM

## 2023-11-16 DIAGNOSIS — I25810 Atherosclerosis of coronary artery bypass graft(s) without angina pectoris: Secondary | ICD-10-CM

## 2023-11-16 DIAGNOSIS — E785 Hyperlipidemia, unspecified: Secondary | ICD-10-CM

## 2023-11-16 DIAGNOSIS — I739 Peripheral vascular disease, unspecified: Secondary | ICD-10-CM | POA: Diagnosis not present

## 2023-11-16 DIAGNOSIS — I420 Dilated cardiomyopathy: Secondary | ICD-10-CM

## 2023-11-16 NOTE — Patient Instructions (Addendum)
Medication Instructions:  The current medical regimen is effective;  continue present plan and medications.  *If you need a refill on your cardiac medications before your next appointment, please call your pharmacy*   Lab Work: Please have blood work at your closest [facility]  (CBC)  If you have labs (blood work) drawn today and your tests are completely normal, you will receive your results only by: MyChart Message (if you have MyChart) OR A paper copy in the mail If you have any lab test that is abnormal or we need to change your treatment, we will call you to review the results.   Testing/Procedures: [name]- Long Term Monitor Instructions  Your physician has requested you wear a ZIO patch monitor for 14 days.  This is a single patch monitor. [employer] supplies one patch monitor per enrollment. Additional stickers are not available. Please do not apply patch if you will be having a Nuclear Stress Test,  Echocardiogram, Cardiac CT, MRI, or Chest Xray during the period you would be wearing the  monitor. The patch cannot be worn during these tests. You cannot remove and re-apply the  ZIO XT patch monitor.  Your ZIO patch monitor will be mailed 3 day USPS to your address on file. It may take 3-5 days  to receive your monitor after you have been enrolled.  Once you have received your monitor, please review the enclosed instructions. Your monitor  has already been registered assigning a specific monitor serial # to you.  Billing and Patient Assistance Program Information  We have supplied [employer] with any of your insurance information on file for billing purposes. [employer] offers a sliding scale Patient Assistance Program for patients that do not have  insurance, or whose insurance does not completely cover the cost of the ZIO monitor.  You must apply for the Patient Assistance Program to qualify for this discounted rate.  To apply, please call [employer] at [phone], select option 4,  select option 2, ask to apply for  Patient Assistance Program. Meredeth Ide will ask your household income, and how many people  are in your household. They will quote your out-of-pocket cost based on that information.  [employer] will also be able to set up a 52-month, interest-free payment plan if needed.  Applying the monitor   Shave hair from upper left chest.  Hold abrader disc by orange tab. Rub abrader in 40 strokes over the upper left chest as  indicated in your monitor instructions.  Clean area with 4 enclosed alcohol pads. Let dry.  Apply patch as indicated in monitor instructions. Patch will be placed under collarbone on left  side of chest with arrow pointing upward.  Rub patch adhesive wings for 2 minutes. Remove white label marked "1". Remove the white  label marked "2". Rub patch adhesive wings for 2 additional minutes.  While looking in a mirror, press and release button in center of patch. A small green light will  flash 3-4 times. This will be your only indicator that the monitor has been turned on.  Do not shower for the first 24 hours. You may shower after the first 24 hours.  Press the button if you feel a symptom. You will hear a small click. Record Date, Time and  Symptom in the Patient Logbook.  When you are ready to remove the patch, follow instructions on the last 2 pages of Patient  Logbook. Stick patch monitor onto the last page of Patient Logbook.  Place Patient Logbook in the  blue and white box. Use locking tab on box and tape box closed  securely. The blue and white box has prepaid postage on it. Please place it in the mailbox as  soon as possible. Your physician should have your test results approximately 7 days after the  monitor has been mailed back to [facility].  Call [facility] Customer Care at [phone] if you have questions regarding  your ZIO XT patch monitor. Call them immediately if you see an orange light blinking on your  monitor.  If your  monitor falls off in less than 4 days, contact our Monitor department at [phone].  If your monitor becomes loose or falls off after 4 days call [employer] at [phone] for  suggestions on securing your monitor  Your physician has requested that you have a carotid duplex. This test is an ultrasound of the carotid arteries in your neck. It looks at blood flow through these arteries that supply the brain with blood. Allow one hour for this exam. There are no restrictions or special instructions. This will be completed at [facility] and you will be contacted to be scheduled.  Follow-Up: At [facility], you and your health needs are our priority.  As part of our continuing mission to provide you with exceptional heart care, we have created designated Provider Care Teams.  These Care Teams include your primary Cardiologist (physician) and Advanced Practice Providers (APPs -  Physician Assistants and Nurse Practitioners) who all work together to provide you with the care you need, when you need it.  We recommend signing up for the patient portal called "MyChart".  Sign up information is provided on this After Visit Summary.  MyChart is used to connect with patients for Virtual Visits (Telemedicine).  Patients are able to view lab/test results, encounter notes, upcoming appointments, etc.  Non-urgent messages can be sent to your provider as well.   To learn more about what you can do with MyChart, go to [email or web address].    Your next appointment:   1 year(s)  Provider:   Rollene Rotunda, MD

## 2023-11-16 NOTE — Progress Notes (Unsigned)
Enrolled for [employer] to mail a ZIO XT long term holter monitor to the patients address on file.  

## 2023-11-21 ENCOUNTER — Telehealth: Payer: Self-pay | Admitting: Cardiology

## 2023-11-21 NOTE — Telephone Encounter (Signed)
  Checking percert on the following patient for testing scheduled at [facility].    Vascular study 11/28/2023

## 2023-11-22 DIAGNOSIS — Z79899 Other long term (current) drug therapy: Secondary | ICD-10-CM | POA: Diagnosis not present

## 2023-11-22 DIAGNOSIS — I25810 Atherosclerosis of coronary artery bypass graft(s) without angina pectoris: Secondary | ICD-10-CM | POA: Diagnosis not present

## 2023-11-22 DIAGNOSIS — R001 Bradycardia, unspecified: Secondary | ICD-10-CM

## 2023-11-22 DIAGNOSIS — I255 Ischemic cardiomyopathy: Secondary | ICD-10-CM | POA: Diagnosis not present

## 2023-11-22 DIAGNOSIS — E785 Hyperlipidemia, unspecified: Secondary | ICD-10-CM | POA: Diagnosis not present

## 2023-11-23 LAB — BASIC METABOLIC PANEL
BUN/Creatinine Ratio: 17 (ref 10–24)
BUN: 12 mg/dL (ref 8–27)
CO2: 26 mmol/L (ref 20–29)
Calcium: 9.3 mg/dL (ref 8.6–10.2)
Chloride: 100 mmol/L (ref 96–106)
Creatinine, Ser: 0.72 mg/dL — ABNORMAL LOW (ref 0.76–1.27)
Glucose: 84 mg/dL (ref 70–99)
Potassium: 3.9 mmol/L (ref 3.5–5.2)
Sodium: 141 mmol/L (ref 134–144)
eGFR: 94 mL/min/{1.73_m2} (ref >59)

## 2023-11-23 LAB — CBC
Hematocrit: 45 % (ref 37.5–51.0)
Hemoglobin: 15.1 g/dL (ref 13.0–17.7)
MCH: 30.6 pg (ref 26.6–33.0)
MCHC: 33.6 g/dL (ref 31.5–35.7)
MCV: 91 fL (ref 79–97)
Platelets: 199 10*3/uL (ref 150–450)
RBC: 4.93 x10E6/uL (ref 4.14–5.80)
RDW: 12.4 % (ref 11.6–15.4)
WBC: 8.5 10*3/uL (ref 3.4–10.8)

## 2023-11-23 LAB — LIPID PANEL
Chol/HDL Ratio: 3.1 {ratio} (ref 0.0–5.0)
Cholesterol, Total: 125 mg/dL (ref 100–199)
HDL: 40 mg/dL (ref >39)
LDL Chol Calc (NIH): 58 mg/dL (ref 0–99)
Triglycerides: 160 mg/dL — ABNORMAL HIGH (ref 0–149)
VLDL Cholesterol Cal: 27 mg/dL (ref 5–40)

## 2023-11-28 ENCOUNTER — Ambulatory Visit (HOSPITAL_COMMUNITY)
Admission: RE | Admit: 2023-11-28 | Discharge: 2023-11-28 | Disposition: A | Discharge status: Home / Self Care | Payer: Medicare Other | Source: Ambulatory Visit | Attending: Cardiology | Admitting: Cardiology

## 2023-11-28 DIAGNOSIS — I739 Peripheral vascular disease, unspecified: Secondary | ICD-10-CM | POA: Insufficient documentation

## 2023-11-28 DIAGNOSIS — Z48812 Encounter for surgical aftercare following surgery on the circulatory system: Secondary | ICD-10-CM | POA: Diagnosis not present

## 2023-11-28 DIAGNOSIS — Z8679 Personal history of other diseases of the circulatory system: Secondary | ICD-10-CM | POA: Diagnosis not present

## 2023-11-28 DIAGNOSIS — Z87891 Personal history of nicotine dependence: Secondary | ICD-10-CM | POA: Diagnosis not present

## 2023-11-29 ENCOUNTER — Telehealth: Payer: Self-pay

## 2023-11-29 DIAGNOSIS — E785 Hyperlipidemia, unspecified: Secondary | ICD-10-CM

## 2023-11-29 MED ORDER — EZETIMIBE 10 MG PO TABS: 10.0000 mg | ORAL_TABLET | Freq: Every day | ORAL | 11 refills | Status: DC

## 2023-11-29 NOTE — Telephone Encounter (Signed)
Results released to patient via my chart. Results letter also sent with lab request order for Lipid profile to be done in 4 months from today. Script sent to [facility] for Zetia 10 mg daily.

## 2023-11-29 NOTE — Telephone Encounter (Signed)
-----   Message from Rollene Rotunda sent at 11/25/2023 11:39 AM EST ----- He does not tolerate other statins.  I would like for his LDL to be 50.  Add Zetia 10 mg to what he is taking and repeat a lipid profile in 4 months.  Call Mr. Rochin with the results and send results to Raliegh Ip, DO

## 2023-12-01 ENCOUNTER — Other Ambulatory Visit (HOSPITAL_COMMUNITY): Payer: Self-pay

## 2023-12-01 DIAGNOSIS — R001 Bradycardia, unspecified: Secondary | ICD-10-CM | POA: Diagnosis not present

## 2023-12-01 DIAGNOSIS — Z8679 Personal history of other diseases of the circulatory system: Secondary | ICD-10-CM

## 2023-12-16 ENCOUNTER — Other Ambulatory Visit: Payer: Self-pay | Admitting: *Deleted

## 2023-12-16 ENCOUNTER — Telehealth: Payer: Self-pay | Admitting: *Deleted

## 2023-12-16 ENCOUNTER — Encounter: Payer: Self-pay | Admitting: *Deleted

## 2023-12-16 DIAGNOSIS — I6523 Occlusion and stenosis of bilateral carotid arteries: Secondary | ICD-10-CM

## 2023-12-16 DIAGNOSIS — I493 Ventricular premature depolarization: Secondary | ICD-10-CM

## 2023-12-16 NOTE — Telephone Encounter (Signed)
Left message for pt to call.

## 2023-12-16 NOTE — Telephone Encounter (Signed)
-----   Message from Rollene Rotunda sent at 12/10/2023  1:40 PM EST ----- He has significantly increased ventricular ectopy and I would like to check an echocardiogram and see him back in in March or so.  Call Mr. Dombek with the results and send results to Raliegh Ip, DO

## 2024-01-08 ENCOUNTER — Other Ambulatory Visit: Payer: Self-pay | Admitting: Cardiology

## 2024-01-08 ENCOUNTER — Other Ambulatory Visit: Payer: Self-pay | Admitting: Family Medicine

## 2024-01-08 DIAGNOSIS — I10 Essential (primary) hypertension: Secondary | ICD-10-CM

## 2024-01-09 NOTE — Telephone Encounter (Signed)
 Tried calling pt to schedule appt but NA and NVM. Phone just rang.

## 2024-01-09 NOTE — Telephone Encounter (Signed)
 [place] NTBS last OV 12/29/22 NO RF sent to pharmacy last OV greater than a year

## 2024-01-11 ENCOUNTER — Telehealth: Payer: Self-pay

## 2024-01-11 NOTE — Telephone Encounter (Signed)
 Copied from CRM [id number]. Topic: Clinical - Prescription Issue >> Jan 11, 2024  3:45 PM [name] wrote: Reason for CRM: Patient's spouse called in regarding the denied refill on chlorthalidone  (HYGROTON ) 25 MG tablet - Contact Center agent reviewed the notes and it appears that he hasn't been seen in over a year and comment left for pharmacy is that the patient needs an appointment. I made an appointment for Feb 27th for Dr. Bonnell Fernandez - Patient's spouse is unsure whether the patient will see anyone else and asking if she can call in medication until his visit in February.

## 2024-01-12 ENCOUNTER — Telehealth: Payer: Self-pay | Admitting: Family Medicine

## 2024-01-12 NOTE — Telephone Encounter (Signed)
Copied from CRM [id number]. Topic: Clinical - Prescription Issue >> Jan 12, 2024  4:11 PM Lorretta Harp wrote: Reason for CRM: Patient called because he is out of his chlorthalidone (HYGROTON) 25 MG tablet and couldn't get in  until February 27,2025 at 1:15 PM - Office Visit [facility] with  Raliegh Ip, DO. He needs medication called in to last him until his next appointment with Dr. Nadine Counts.

## 2024-01-13 ENCOUNTER — Ambulatory Visit (INDEPENDENT_AMBULATORY_CARE_PROVIDER_SITE_OTHER): Payer: Medicare Other | Admitting: Family Medicine

## 2024-01-13 ENCOUNTER — Encounter: Payer: Self-pay | Admitting: Family Medicine

## 2024-01-13 VITALS — BP 133/66 | HR 89 | Temp 97.9°F | Ht 70.0 in | Wt 216.0 lb

## 2024-01-13 DIAGNOSIS — F325 Major depressive disorder, single episode, in full remission: Secondary | ICD-10-CM

## 2024-01-13 DIAGNOSIS — Z0001 Encounter for general adult medical examination with abnormal findings: Secondary | ICD-10-CM

## 2024-01-13 DIAGNOSIS — E785 Hyperlipidemia, unspecified: Secondary | ICD-10-CM

## 2024-01-13 DIAGNOSIS — I25810 Atherosclerosis of coronary artery bypass graft(s) without angina pectoris: Secondary | ICD-10-CM

## 2024-01-13 DIAGNOSIS — I10 Essential (primary) hypertension: Secondary | ICD-10-CM | POA: Diagnosis not present

## 2024-01-13 DIAGNOSIS — I739 Peripheral vascular disease, unspecified: Secondary | ICD-10-CM

## 2024-01-13 DIAGNOSIS — N400 Enlarged prostate without lower urinary tract symptoms: Secondary | ICD-10-CM

## 2024-01-13 DIAGNOSIS — Z0000 Encounter for general adult medical examination without abnormal findings: Secondary | ICD-10-CM

## 2024-01-13 MED ORDER — CITALOPRAM HYDROBROMIDE 20 MG PO TABS: 20.0000 mg | ORAL_TABLET | Freq: Every day | ORAL | 3 refills | Status: AC

## 2024-01-13 MED ORDER — CHLORTHALIDONE 25 MG PO TABS: 25.0000 mg | ORAL_TABLET | Freq: Every day | ORAL | 3 refills | Status: DC

## 2024-01-13 MED ORDER — PRAVASTATIN SODIUM 80 MG PO TABS: 80.0000 mg | ORAL_TABLET | Freq: Every day | ORAL | 3 refills | Status: DC

## 2024-01-13 MED ORDER — NITROGLYCERIN 0.4 MG SL SUBL: 0.4000 mg | SUBLINGUAL_TABLET | SUBLINGUAL | 1 refills | Status: DC | PRN

## 2024-01-13 MED ORDER — EZETIMIBE 10 MG PO TABS: 10.0000 mg | ORAL_TABLET | Freq: Every day | ORAL | 11 refills | Status: AC

## 2024-01-13 NOTE — Telephone Encounter (Signed)
Put him in for CPE in the CPE slot that opened at 345. He wants the slot and is aware it's a CPE

## 2024-01-13 NOTE — Telephone Encounter (Signed)
Apt made , pt already seen and left

## 2024-01-13 NOTE — Progress Notes (Signed)
[name] is a 78 y.o. male presents to office today for annual physical exam examination.    Concerns today include: 1. Heel Reports a spot on the back of the right heel.  Not sure if it something he should be concerned about.  Denies any pain  Occupation: retired, Marital status: married Steward Drone, Substance use: none There are no preventive care reminders to display for this patient.  Refills needed today: all  Immunization History  Administered Date(s) Administered   Fluad Quad(high Dose 65+) 11/24/2020   Influenza, High Dose Seasonal PF 01/09/2018, 10/26/2018, 11/06/2019   Influenza,inj,Quad PF,6+ Mos 10/22/2013, 10/01/2014, 12/17/2015   Influenza-Unspecified 01/09/2018, 10/26/2018, 11/06/2019, 09/26/2021   Moderna Sars-Covid-2 Vaccination 03/01/2020, 04/02/2020, 12/18/2020   Pneumococcal Conjugate-13 10/22/2013   Pneumococcal Polysaccharide-23 11/03/2010   Td 12/29/2022   Tdap 09/17/2011   Past Medical History:  Diagnosis Date   Acid reflux    Carotid artery occlusion    right carotid stenting in 2007   Chest pain    CHEST PAIN-UNSPECIFIED 06/20/2009   Qualifier: Diagnosis of   By: Denyse Amass, CMA, Carol       Coronary artery disease    a. 2010 --> cath showing LM and 3-vessel CAD --> s/p CABG with LIMA-LAD, SVG-D1, SVG-OM2 and SVD-PDA   CVA (cerebral vascular accident) (HCC)    Hyperlipidemia    Hypertension    Obesity    Social History   Socioeconomic History   Marital status: Married    Spouse name: Steward Drone   Number of children: 2   Years of education: Not on file   Highest education level: Not on file  Occupational History   Occupation: Retired  Tobacco Use   Smoking status: Former    Current packs/day: 0.00    Average packs/day: 1.5 packs/day for 41.0 years (61.5 ttl pk-yrs)    Types: Cigarettes    Start date: 02/04/1965    Quit date: 12/27/2005    Years since quitting: 18.0   Smokeless tobacco: Never  Vaping Use   Vaping status: Never Used  Substance  and Sexual Activity   Alcohol use: No    Alcohol/week: 0.0 standard drinks of alcohol   Drug use: No   Sexual activity: Not Currently  Other Topics Concern   Not on file  Social History Narrative   Daughter lives with them   Son lives in [place]   Social Drivers of Health   Financial Resource Strain: High Risk (01/13/2024)   Overall Financial Resource Strain (CARDIA)    Difficulty of Paying Living Expenses: Very hard  Food Insecurity: No Food Insecurity (01/13/2024)   Hunger Vital Sign    Worried About Running Out of Food in the Last Year: Never true    Ran Out of Food in the Last Year: Never true  Transportation Needs: No Transportation Needs (01/13/2024)   PRAPARE - Administrator, Civil Service (Medical): No    Lack of Transportation (Non-Medical): No  Physical Activity: Inactive (01/13/2024)   Exercise Vital Sign    Days of Exercise per Week: 0 days    Minutes of Exercise per Session: 0 min  Stress: No Stress Concern Present (01/13/2024)   [employer] of Occupational Health - Occupational Stress Questionnaire    Feeling of Stress : Not at all  Social Connections: Socially Integrated (01/03/2023)   Social Connection and Isolation Panel [NHANES]    Frequency of Communication with Friends and Family: More than three times a week    Frequency of  Social Gatherings with Friends and Family: More than three times a week    Attends Religious Services: More than 4 times per year    Active Member of [employer] or Organizations: Yes    Attends Engineer, structural: More than 4 times per year    Marital Status: Married  Catering manager Violence: Not At Risk (01/13/2024)   Humiliation, Afraid, Rape, and Kick questionnaire    Fear of Current or Ex-Partner: No    Emotionally Abused: No    Physically Abused: No    Sexually Abused: No   Past Surgical History:  Procedure Laterality Date   CORONARY ARTERY BYPASS GRAFT     TONSILLECTOMY     Family History   Problem Relation Age of Onset   Heart attack Father    Heart attack Brother     Current Outpatient Medications:    aspirin 81 MG tablet, Take 81 mg by mouth daily.  , Disp: , Rfl:    b complex vitamins capsule, Take 1 capsule by mouth daily., Disp: , Rfl:    cholecalciferol (VITAMIN D3) 25 MCG (1000 UNIT) tablet, Take 1,000 Units by mouth daily., Disp: , Rfl:    vitamin C (ASCORBIC ACID) 500 MG tablet, Take 500 mg by mouth daily., Disp: , Rfl:    chlorthalidone (HYGROTON) 25 MG tablet, Take 1 tablet (25 mg total) by mouth daily., Disp: 100 tablet, Rfl: 3   citalopram (CELEXA) 20 MG tablet, Take 1 tablet (20 mg total) by mouth daily., Disp: 100 tablet, Rfl: 3   ezetimibe (ZETIA) 10 MG tablet, Take 1 tablet (10 mg total) by mouth daily., Disp: 30 tablet, Rfl: 11   nitroGLYCERIN (NITROSTAT) 0.4 MG SL tablet, Place 1 tablet (0.4 mg total) under the tongue every 5 (five) minutes as needed for chest pain., Disp: 25 tablet, Rfl: 1   pravastatin (PRAVACHOL) 80 MG tablet, Take 1 tablet (80 mg total) by mouth daily., Disp: 100 tablet, Rfl: 3  Allergies  Allergen Reactions   Atorvastatin Other (See Comments)    Muscle aches   Crestor [Rosuvastatin Calcium] Other (See Comments)    Muscle aches   Iodinated Contrast Media Rash    rash   Niaspan [Niacin Er (Antihyperlipidemic)] Other (See Comments)    Muscle aches     ROS: Review of Systems Pertinent items noted in HPI and remainder of comprehensive ROS otherwise negative.    Physical exam BP 133/66   Pulse 89   Temp 97.9 F (36.6 C)   Ht 5\' 10"  (1.778 m)   Wt 216 lb (98 kg)   SpO2 97%   BMI 30.99 kg/m  General appearance: alert, cooperative, appears stated age, no distress, and mildly obese Head: Normocephalic, without obvious abnormality, atraumatic Eyes: negative findings: lids and lashes normal, conjunctivae and sclerae normal, corneas clear, and pupils equal, round, reactive to light and accomodation Ears: normal TM's and  external ear canals both ears Nose: Nares normal. Septum midline. Mucosa normal. No drainage or sinus tenderness. Throat: lips, mucosa, and tongue normal; teeth and gums normal Neck: no adenopathy, supple, symmetrical, trachea midline, and thyroid not enlarged, symmetric, no tenderness/mass/nodules Back: symmetric, no curvature. ROM normal. No CVA tenderness. Lungs: clear to auscultation bilaterally Heart: regular rate and rhythm, S1, S2 normal, no murmur, click, rub or gallop Abdomen:  protuberant, soft NT/ND Extremities: extremities normal, atraumatic, no cyanosis or edema Pulses: 2+ and symmetric Skin: Skin color, texture, turgor normal. No rashes or lesions Lymph nodes: Cervical, supraclavicular, and axillary nodes  normal. Neurologic: hard of hearing, otherwise unremarkable      01/13/2024    3:23 PM 01/03/2023    2:04 PM 12/29/2022    2:08 PM  Depression screen PHQ 2/9  Decreased Interest 0 0 0  Down, Depressed, Hopeless 0 0 0  PHQ - 2 Score 0 0 0  Altered sleeping 0 0 0  Tired, decreased energy 0 0 0  Change in appetite 0 0 0  Feeling bad or failure about yourself  0 0 0  Trouble concentrating 0 0 0  Moving slowly or fidgety/restless 0 0 0  Suicidal thoughts 0 0 0  PHQ-9 Score 0 0 0  Difficult doing work/chores Not difficult at all Not difficult at all Not difficult at all      01/13/2024    3:23 PM 12/29/2022    2:09 PM 03/16/2022    2:15 PM 08/10/2019   10:04 AM  GAD 7 : Generalized Anxiety Score  Nervous, Anxious, on Edge 0 0 0 0  Control/stop worrying 0 0 0 0  Worry too much - different things 0 0 0 0  Trouble relaxing 0 0 0 0  Restless 0 0 0 0  Easily annoyed or irritable 0 0 0 0  Afraid - awful might happen 0 0 0 0  Total GAD 7 Score 0 0 0 0  Anxiety Difficulty  Not difficult at all Not difficult at all Not difficult at all     Assessment/ Plan: Dondra Prader here for annual physical exam.   Annual physical exam  Coronary atherosclerosis of autologous vein  bypass graft without angina - Plan: CMP14+EGFR, CBC, TSH, nitroGLYCERIN (NITROSTAT) 0.4 MG SL tablet, pravastatin (PRAVACHOL) 80 MG tablet  Dyslipidemia - Plan: ezetimibe (ZETIA) 10 MG tablet, pravastatin (PRAVACHOL) 80 MG tablet  Essential hypertension - Plan: CMP14+EGFR, chlorthalidone (HYGROTON) 25 MG tablet  PVD (peripheral vascular disease) (HCC) - Plan: CBC, TSH  Benign prostatic hyperplasia without lower urinary tract symptoms - Plan: CMP14+EGFR, PSA, CBC  Major depressive disorder with single episode, in full remission (HCC) - Plan: CMP14+EGFR, citalopram (CELEXA) 20 MG tablet  Declines influenza vaccine, shingles vaccine and colon cancer screening  He is asymptomatic from a CAD standpoint.  I have renewed his medications and we will check nonfasting labs today.  Check CBC given history of PVD.  He had a small crack on the right heel and we discussed wound care at home.  No evidence of secondary bacterial infection or complication at this time  Asymptomatic from a prostate standpoint.  Check PSA  Celexa renewed.  MDD is stable    Counseled on healthy lifestyle choices, including diet (rich in fruits, vegetables and lean meats and low in salt and simple carbohydrates) and exercise (at least 30 minutes of moderate physical activity daily).  Patient to follow up 1 year for CPE  Thien Berka M. Nadine Counts, DO

## 2024-01-24 ENCOUNTER — Ambulatory Visit (HOSPITAL_COMMUNITY): Payer: Medicare Other

## 2024-02-06 ENCOUNTER — Other Ambulatory Visit: Payer: Self-pay | Admitting: Family Medicine

## 2024-02-06 NOTE — Telephone Encounter (Unsigned)
 Copied from CRM [id number]. Topic: Clinical - Medication Refill >> Feb 06, 2024  1:43 PM Bronson Canny wrote: Most Recent Primary Care Visit:  Provider: Eliodoro Guerin  Department[facility]  Visit Type: PHYSICAL  Date: 01/13/2024  Medication: citalopram  (CELEXA ) 20 MG tablet [[id number]]  Has the patient contacted their pharmacy? Yes (Agent: If no, request that the patient contact the pharmacy for the refill. If patient does not wish to contact the pharmacy document the reason why and proceed with request.) (Agent: If yes, when and what did the pharmacy advise?)  Is this the correct pharmacy for this prescription? Yes If no, delete pharmacy and type the correct one.  This is the patient's preferred pharmacy:  [id number] - [place], [place] - [address] [place] [address] Phone: [phone] Fax: [phone]   Has the prescription been filled recently? No  Is the patient out of the medication? Yes  Has the patient been seen for an appointment in the last year OR does the patient have an upcoming appointment? Yes  Can we respond through MyChart? No Prefer call back  Agent: Please be advised that Rx refills may take up to 3 business days. We ask that you follow-up with your pharmacy.

## 2024-02-08 ENCOUNTER — Other Ambulatory Visit: Payer: Self-pay

## 2024-02-08 ENCOUNTER — Telehealth: Payer: Self-pay

## 2024-02-08 DIAGNOSIS — F325 Major depressive disorder, single episode, in full remission: Secondary | ICD-10-CM

## 2024-02-08 NOTE — Telephone Encounter (Signed)
Copied from CRM [id number]. Topic: Clinical - Prescription Issue >> Feb 08, 2024  1:42 PM [name] wrote: Reason for CRM: Patient still has not received the medicine celexa 20mg  to the pharmacy.Please call to advise if there is a problem. Patients wife would like a call either way.

## 2024-02-08 NOTE — Telephone Encounter (Signed)
Verified pharmacy with wife - medication was sent to correct place - called and verified with pharmacy that meds are there

## 2024-02-09 ENCOUNTER — Ambulatory Visit (HOSPITAL_COMMUNITY): Payer: Medicare Other | Attending: Cardiology

## 2024-02-09 DIAGNOSIS — R001 Bradycardia, unspecified: Secondary | ICD-10-CM | POA: Diagnosis not present

## 2024-02-09 DIAGNOSIS — I493 Ventricular premature depolarization: Secondary | ICD-10-CM

## 2024-02-09 LAB — ECHOCARDIOGRAM COMPLETE
Area-P 1/2: 2.93 cm2
S' Lateral: 3.7 cm

## 2024-02-09 MED ORDER — PERFLUTREN LIPID MICROSPHERE
3.0000 mL | INTRAVENOUS | Status: AC | PRN
  Administered 2024-02-09: 3 mL via INTRAVENOUS

## 2024-02-23 ENCOUNTER — Encounter: Payer: Self-pay | Admitting: *Deleted

## 2024-02-23 ENCOUNTER — Ambulatory Visit: Payer: Medicare Other | Admitting: Family Medicine

## 2024-03-19 DIAGNOSIS — D225 Melanocytic nevi of trunk: Secondary | ICD-10-CM | POA: Diagnosis not present

## 2024-03-19 DIAGNOSIS — L821 Other seborrheic keratosis: Secondary | ICD-10-CM | POA: Diagnosis not present

## 2024-03-19 DIAGNOSIS — L820 Inflamed seborrheic keratosis: Secondary | ICD-10-CM | POA: Diagnosis not present

## 2024-03-19 DIAGNOSIS — D485 Neoplasm of uncertain behavior of skin: Secondary | ICD-10-CM | POA: Diagnosis not present

## 2024-03-27 ENCOUNTER — Telehealth: Payer: Self-pay

## 2024-03-27 NOTE — Telephone Encounter (Signed)
 Copied from CRM [id number]. Topic: General - Other >> Mar 27, 2024 12:22 PM Carlatta H wrote: Reason for CRM: Please have Judeth Cornfield give the patient a call back

## 2024-03-27 NOTE — Telephone Encounter (Signed)
 Unsure as to what is needed , if pt calls back please get more information   No one by that name is at our office

## 2024-03-30 DIAGNOSIS — E785 Hyperlipidemia, unspecified: Secondary | ICD-10-CM | POA: Diagnosis not present

## 2024-03-31 LAB — LIPID PANEL
Chol/HDL Ratio: 2.5 ratio (ref 0.0–5.0)
Cholesterol, Total: 107 mg/dL (ref 100–199)
HDL: 42 mg/dL (ref >39)
LDL Chol Calc (NIH): 47 mg/dL (ref 0–99)
Triglycerides: 96 mg/dL (ref 0–149)
VLDL Cholesterol Cal: 18 mg/dL (ref 5–40)

## 2024-04-03 DIAGNOSIS — R001 Bradycardia, unspecified: Secondary | ICD-10-CM | POA: Insufficient documentation

## 2024-04-03 NOTE — Progress Notes (Unsigned)
  Cardiology Office Note:   Date:  04/04/2024  ID:  [name], DOB 1946-10-19, MRN [id number] PCP: Raliegh Ip, DO  [facility] Providers Cardiologist:  Rollene Rotunda, MD {  History of Present Illness:   [name] is a 78 y.o. male  who presents for follow up of CAD.  He had CABG in July 2010 with a mildly reduced EF.  Since I last saw him he has done well.  However, I wanted to see him back because an echo in February suggested that the EF was down to 35 to 40% from a previous 45 to 50%.  He denies any new cardiovascular symptoms. The patient denies any new symptoms such as chest discomfort, neck or arm discomfort. There has been no new shortness of breath, PND or orthopnea. There have been no reported palpitations, presyncope or syncope.  He cuts wood.  He has not noticed any symptoms related to this.    ROS: As stated in the HPI and negative for all other systems.  Studies Reviewed:    EKG:    NA    Risk Assessment/Calculations:              Physical Exam:   VS:  BP 120/80   Pulse 66   Ht 5\' 10"  (1.778 m)   Wt 212 lb (96.2 kg)   BMI 30.42 kg/m    Wt Readings from Last 3 Encounters:  04/04/24 212 lb (96.2 kg)  01/13/24 216 lb (98 kg)  11/16/23 210 lb (95.3 kg)     GEN: Well nourished, well developed in no acute distress NECK: No JVD; No carotid bruits CARDIAC: RRR, 2 out of 6 apical systolic murmur radiating slightly at the aortic outflow tract, no diastolic murmurs, rubs, gallops RESPIRATORY:  Clear to auscultation without rales, wheezing or rhonchi  ABDOMEN: Soft, non-tender, non-distended EXTREMITIES:  No edema; No deformity   ASSESSMENT AND PLAN:   HTN -    His blood pressure is at target but will also be managed in the context of managing his cardiomyopathy.   CAD, AUTOLOGOUS BYPASS GRAFT -  The patient has no new sypmtoms.  No further cardiovascular testing is indicated.  We will continue with aggressive risk reduction and meds as listed.    HYPERLIPIDEMIA-MIXED -  He is tolerating pravastatin.  LDL 47 with an HDL of 42.  No change in therapy.   rapy will be an LDL in the 50s.   CVA -  He had less than 50% carotid stenosis bilaterally in 2022.  She had left carotid stenosis of 50 - 69% in 2024 Dec.  I will repeat Doppler in Dec 2025.    CARDIOMYPATHY -  I might stop his chlorthalidone and start to titrate meds for presumed ischemic cardiomyopathy.  He has class I symptoms.  Start Entresto 24-26 twice daily.   BRADYCARDIA - He does have a slower heart rate but on recent monitoring he had no pauses.  He did have increased ventricular ectopy but has no symptoms related to this.       Follow up with me in 2 months.   Signed, Rollene Rotunda, MD

## 2024-04-04 ENCOUNTER — Encounter: Payer: Self-pay | Admitting: Cardiology

## 2024-04-04 ENCOUNTER — Ambulatory Visit: Payer: Medicare Other | Admitting: Cardiology

## 2024-04-04 VITALS — BP 120/80 | HR 66 | Ht 70.0 in | Wt 212.0 lb

## 2024-04-04 DIAGNOSIS — I255 Ischemic cardiomyopathy: Secondary | ICD-10-CM

## 2024-04-04 DIAGNOSIS — E785 Hyperlipidemia, unspecified: Secondary | ICD-10-CM | POA: Diagnosis not present

## 2024-04-04 DIAGNOSIS — R001 Bradycardia, unspecified: Secondary | ICD-10-CM

## 2024-04-04 DIAGNOSIS — I25810 Atherosclerosis of coronary artery bypass graft(s) without angina pectoris: Secondary | ICD-10-CM | POA: Diagnosis not present

## 2024-04-04 DIAGNOSIS — I10 Essential (primary) hypertension: Secondary | ICD-10-CM

## 2024-04-04 MED ORDER — SACUBITRIL-VALSARTAN 24-26 MG PO TABS: 1.0000 | ORAL_TABLET | Freq: Two times a day (BID) | ORAL | 6 refills | Status: DC

## 2024-04-04 NOTE — Patient Instructions (Signed)
 Medication Instructions:  Please discontinue your Chlorthalidone. Start Entresto 24-26 mg one tablet twice a day. Continue all other medications as listed.  *If you need a refill on your cardiac medications before your next appointment, please call your pharmacy*  Follow-Up: At [facility], you and your health needs are our priority.  As part of our continuing mission to provide you with exceptional heart care, our providers are all part of one team.  This team includes your primary Cardiologist (physician) and Advanced Practice Providers or APPs (Physician Assistants and Nurse Practitioners) who all work together to provide you with the care you need, when you need it.  Your next appointment:   2 month(s)  Provider:   Rollene Rotunda, MD    We recommend signing up for the patient portal called "MyChart".  Sign up information is provided on this After Visit Summary.  MyChart is used to connect with patients for Virtual Visits (Telemedicine).  Patients are able to view lab/test results, encounter notes, upcoming appointments, etc.  Non-urgent messages can be sent to your provider as well.   To learn more about what you can do with MyChart, go to [email or web address].

## 2024-04-20 ENCOUNTER — Other Ambulatory Visit: Payer: Self-pay | Admitting: *Deleted

## 2024-04-20 MED ORDER — SACUBITRIL-VALSARTAN 24-26 MG PO TABS: 1.0000 | ORAL_TABLET | Freq: Two times a day (BID) | ORAL | 3 refills | Status: DC

## 2024-05-28 NOTE — Progress Notes (Unsigned)
  Cardiology Office Note:   Date:  05/30/2024  ID:  KELLIS MCADAM, DOB Jul 28, 1946, MRN [id number] PCP: Eliodoro Guerin, DO  [facility] Providers Cardiologist:  Eilleen Grates, MD {  History of Present Illness:   [name] is a 78 y.o. male who presents for follow up of CAD.  He had CABG in July 2010 with a mildly reduced EF.  Send him for an echocardiogram in February 2025.  Ejection fraction was 35 to 40%.  He had no new symptoms.  I saw him back in started Entresto .  He did well with that.  He thinks that he has a little more energy with it.  He has been active.  He is not having any new shortness of breath, PND or orthopnea but he does not feel any palpitations and has had no presyncope or syncope.  He is having dizziness.  We managed to help him get the medicine for free.  He brought him in [place] today.     ROS: As stated in the HPI and negative for all other systems.  Studies Reviewed:    EKG:   NA  Risk Assessment/Calculations:      Physical Exam:   VS:  BP (!) 142/80   Pulse 64   Ht 5\' 10"  (1.778 m)   Wt 212 lb (96.2 kg)   BMI 30.42 kg/m    Wt Readings from Last 3 Encounters:  05/30/24 212 lb (96.2 kg)  04/04/24 212 lb (96.2 kg)  01/13/24 216 lb (98 kg)     GEN: Well nourished, well developed in no acute distress NECK: No JVD; No carotid bruits CARDIAC: RRR, no murmurs, rubs, gallops RESPIRATORY:  Clear to auscultation without rales, wheezing or rhonchi  ABDOMEN: Soft, non-tender, non-distended EXTREMITIES:  No edema; No deformity   ASSESSMENT AND PLAN:   HTN -    His blood pressure is being managed in the context of treating his cardiomyopathy.   CAD, AUTOLOGOUS BYPASS GRAFT -  The patient has no new symptoms.  No change in therapy.   HYPERLIPIDEMIA-MIXED -  He is tolerating pravastatin .  He is at target goal LDL   CVA -  She had left carotid stenosis of 50 - 69% in 2024 Dec.  I will repeat Doppler in Dec 2025.       CARDIOMYPATHY -  Today  I will increase his Entresto  to 49/51 twice daily.  I will follow up with a BMET in a couple of weeks.    BRADYCARDIA - Has had no bradycardic symptoms.  No change in therapy.  See     Follow up with me in 2 months.  Signed, Eilleen Grates, MD

## 2024-05-30 ENCOUNTER — Ambulatory Visit: Admitting: Cardiology

## 2024-05-30 ENCOUNTER — Encounter: Payer: Self-pay | Admitting: Cardiology

## 2024-05-30 VITALS — BP 142/80 | HR 64 | Ht 70.0 in | Wt 212.0 lb

## 2024-05-30 DIAGNOSIS — I2510 Atherosclerotic heart disease of native coronary artery without angina pectoris: Secondary | ICD-10-CM | POA: Diagnosis not present

## 2024-05-30 DIAGNOSIS — I428 Other cardiomyopathies: Secondary | ICD-10-CM | POA: Diagnosis not present

## 2024-05-30 DIAGNOSIS — I10 Essential (primary) hypertension: Secondary | ICD-10-CM

## 2024-05-30 MED ORDER — ENTRESTO 49-51 MG PO TABS: 1.0000 | ORAL_TABLET | Freq: Two times a day (BID) | ORAL | 11 refills | Status: DC

## 2024-05-30 NOTE — Patient Instructions (Addendum)
 Medication Instructions:  Your physician has recommended you make the following change in your medication:   Increase Entresto  to 49-51 mg Two Times Daily ( You may double up on the Entresto  26-26 mg until you run out then pick up you new prescription)   *If you need a refill on your cardiac medications before your next appointment, please call your pharmacy*  Lab Work: Your physician recommends that you return for lab work in:2 Weeks (BMET)     If you have labs (blood work) drawn today and your tests are completely normal, you will receive your results only by: [employer] (if you have MyChart) OR A paper copy in the mail If you have any lab test that is abnormal or we need to change your treatment, we will call you to review the results.  Testing/Procedures: NONE   Follow-Up: At [facility], you and your health needs are our priority.  As part of our continuing mission to provide you with exceptional heart care, our providers are all part of one team.  This team includes your primary Cardiologist (physician) and Advanced Practice Providers or APPs (Physician Assistants and Nurse Practitioners) who all work together to provide you with the care you need, when you need it.  Your next appointment:   2 month(s)  Provider:   Eilleen Grates, MD    We recommend signing up for the patient portal called "MyChart".  Sign up information is provided on this After Visit Summary.  MyChart is used to connect with patients for Virtual Visits (Telemedicine).  Patients are able to view lab/test results, encounter notes, upcoming appointments, etc.  Non-urgent messages can be sent to your provider as well.   To learn more about what you can do with MyChart, go to [email or web address].   Other Instructions Thank you for choosing [facility]!

## 2024-06-05 DIAGNOSIS — H43812 Vitreous degeneration, left eye: Secondary | ICD-10-CM | POA: Diagnosis not present

## 2024-06-13 DIAGNOSIS — I428 Other cardiomyopathies: Secondary | ICD-10-CM | POA: Diagnosis not present

## 2024-06-13 DIAGNOSIS — I10 Essential (primary) hypertension: Secondary | ICD-10-CM | POA: Diagnosis not present

## 2024-06-14 ENCOUNTER — Ambulatory Visit: Payer: Self-pay | Admitting: Cardiology

## 2024-06-14 LAB — BASIC METABOLIC PANEL WITH GFR
BUN/Creatinine Ratio: 14 (ref 10–24)
BUN: 11 mg/dL (ref 8–27)
CO2: 23 mmol/L (ref 20–29)
Calcium: 9.3 mg/dL (ref 8.6–10.2)
Chloride: 104 mmol/L (ref 96–106)
Creatinine, Ser: 0.81 mg/dL (ref 0.76–1.27)
Glucose: 73 mg/dL (ref 70–99)
Potassium: 4.6 mmol/L (ref 3.5–5.2)
Sodium: 143 mmol/L (ref 134–144)
eGFR: 90 mL/min/{1.73_m2} (ref >59)

## 2024-07-06 ENCOUNTER — Other Ambulatory Visit (HOSPITAL_COMMUNITY): Payer: Self-pay

## 2024-07-06 ENCOUNTER — Telehealth: Payer: Self-pay | Admitting: Cardiology

## 2024-07-06 ENCOUNTER — Telehealth: Payer: Self-pay | Admitting: Pharmacy Technician

## 2024-07-06 NOTE — Telephone Encounter (Signed)
 Routing to medication assistance team

## 2024-07-06 NOTE — Telephone Encounter (Signed)
 Pt c/o medication issue:  1. Name of Medication:   sacubitril -valsartan  (ENTRESTO ) 49-51 MG    2. How are you currently taking this medication (dosage and times per day)?  Take 1 tablet by mouth 2 (two) times daily.       3. Are you having a reaction (difficulty breathing--STAT)? No  4. What is your medication issue? Pt is requesting a callback regarding him wanting to speak about getting assistance with this medication and refilling the correct dosage so that he only takes it once a day instead of twice. Please advise

## 2024-07-09 ENCOUNTER — Other Ambulatory Visit (HOSPITAL_COMMUNITY): Payer: Self-pay

## 2024-07-09 NOTE — Telephone Encounter (Signed)
 Patient Advocate Encounter   The patient was approved for a [employer] grant that will help cover the cost of Entresto   Total amount awarded, 4500.00.  Effective: 06/09/24 - 06/08/25   APW:[id number] ERW:[id number] Hmnle:[id number] PI:[id number]  [employer] ID: [id number]   Pharmacy provided with approval and processing information. Patient informed via phone call

## 2024-08-01 NOTE — Progress Notes (Unsigned)
  Cardiology Office Note:   Date:  08/02/2024  ID:  [name], DOB March 04, 1946, MRN [id number] PCP: Jolinda Norene CHRISTELLA, DO  [facility] Providers Cardiologist:  Lynwood Schilling, MD {  History of Present Illness:   [name] is a 78 y.o. male who presents for follow up of CAD.  He had CABG in July 2010 with a mildly reduced EF.  Send him for an echocardiogram in February 2025.  Ejection fraction was 35 to 40%.  He had no new symptoms.  At the last visit I increased his Entresto .    He has had some trouble getting the increased dose of Entresto  and is still doubling up on the 24/26 twice daily.  He did tolerate with titration.  The patient denies any new symptoms such as chest discomfort, neck or arm discomfort. There has been no new shortness of breath, PND or orthopnea. There have been no reported palpitations, presyncope or syncope.   He gave me a Matchbox [place] [address] at the last visit.  ROS: As stated in the HPI and negative for all other systems.  Studies Reviewed:    EKG:   EKG Interpretation Date/Time:  Thursday August 02 2024 11:27:49 EDT Ventricular Rate:  56 PR Interval:  368 QRS Duration:  110 QT Interval:  434 QTC Calculation: 418 R Axis:   42  Text Interpretation: Sinus bradycardia with 1st degree A-V block Possible Inferior infarct (cited on or before 16-Nov-2023) Anteroseptal infarct (cited on or before 16-Nov-2023) When compared with ECG of 16-Nov-2023 11:58, No significant change was found Confirmed by Schilling Lynwood ([id number]) on 08/02/2024 12:06:14 PM   Risk Assessment/Calculations:       Physical Exam:   VS:  BP (!) 162/75 (BP Location: Right Arm, Patient Position: Sitting, Cuff Size: Normal)   Pulse (!) 56   Ht 5' 10 (1.778 m)   Wt 214 lb 11.2 oz (97.4 kg)   SpO2 96%   BMI 30.81 kg/m    Wt Readings from Last 3 Encounters:  08/02/24 214 lb 11.2 oz (97.4 kg)  05/30/24 212 lb (96.2 kg)  04/04/24 212 lb (96.2 kg)     GEN: Well nourished, well  developed in no acute distress NECK: No JVD; No carotid bruits CARDIAC: RRR, no murmurs, rubs, gallops RESPIRATORY:  Clear to auscultation without rales, wheezing or rhonchi  ABDOMEN: Soft, non-tender, non-distended EXTREMITIES:  No edema; No deformity   ASSESSMENT AND PLAN:   HTN -    His blood pressure is being managed in the context of treating his heart failure.    CAD, AUTOLOGOUS BYPASS GRAFT -  The patient has no new sypmtoms.  No further cardiovascular testing is indicated.  We will continue with aggressive risk reduction and meds as listed.   HYPERLIPIDEMIA-MIXED -  He is tolerating pravastatin .    CVA -  She had left carotid stenosis of 50 - 69% in 2024 Dec.  I will follow-up a repeat Doppler in December 2025.   CARDIOMYPATHY -  He is euvolemic.  Today I will try to increase his Entresto  to 97/103 twice daily.  He is getting it through patient assistance and we are going to try to get that accomplished.   BRADYCARDIA - He tolerates his slow heart rate but would not tolerate a beta-blocker so that will not be part of his med titration.   Follow up with me in [place] in about 3 or 4 months.  Signed, Lynwood Schilling, MD

## 2024-08-02 ENCOUNTER — Encounter: Payer: Self-pay | Admitting: Cardiology

## 2024-08-02 ENCOUNTER — Ambulatory Visit: Attending: Cardiology | Admitting: Cardiology

## 2024-08-02 ENCOUNTER — Other Ambulatory Visit (HOSPITAL_COMMUNITY): Payer: Self-pay

## 2024-08-02 VITALS — BP 162/75 | HR 56 | Ht 70.0 in | Wt 214.7 lb

## 2024-08-02 DIAGNOSIS — E785 Hyperlipidemia, unspecified: Secondary | ICD-10-CM | POA: Diagnosis not present

## 2024-08-02 DIAGNOSIS — I428 Other cardiomyopathies: Secondary | ICD-10-CM | POA: Diagnosis not present

## 2024-08-02 DIAGNOSIS — I10 Essential (primary) hypertension: Secondary | ICD-10-CM

## 2024-08-02 DIAGNOSIS — R001 Bradycardia, unspecified: Secondary | ICD-10-CM

## 2024-08-02 DIAGNOSIS — I25810 Atherosclerosis of coronary artery bypass graft(s) without angina pectoris: Secondary | ICD-10-CM | POA: Diagnosis not present

## 2024-08-02 MED ORDER — SACUBITRIL-VALSARTAN 97-103 MG PO TABS: 1.0000 | ORAL_TABLET | Freq: Two times a day (BID) | ORAL | 3 refills | Status: DC

## 2024-08-02 NOTE — Patient Instructions (Signed)
 Medication Instructions:  Increase Entresto  to 97-103 mg twice daily *If you need a refill on your cardiac medications before your next appointment, please call your pharmacy*  Lab Work: NONE If you have labs (blood work) drawn today and your tests are completely normal, you will receive your results only by: MyChart Message (if you have MyChart) OR A paper copy in the mail If you have any lab test that is abnormal or we need to change your treatment, we will call you to review the results.  Testing/Procedures: NONE  Follow-Up: At [facility], you and your health needs are our priority.  As part of our continuing mission to provide you with exceptional heart care, our providers are all part of one team.  This team includes your primary Cardiologist (physician) and Advanced Practice Providers or APPs (Physician Assistants and Nurse Practitioners) who all work together to provide you with the care you need, when you need it.  Your next appointment:   3-4 months in [place]  Provider:   Lavona, MD  We recommend signing up for the patient portal called MyChart.  Sign up information is provided on this After Visit Summary.  MyChart is used to connect with patients for Virtual Visits (Telemedicine).  Patients are able to view lab/test results, encounter notes, upcoming appointments, etc.  Non-urgent messages can be sent to your provider as well.   To learn more about what you can do with MyChart, go to [email or web address].

## 2024-08-10 ENCOUNTER — Telehealth: Payer: Self-pay | Admitting: Cardiology

## 2024-08-10 NOTE — Telephone Encounter (Signed)
 Pt c/o medication issue:  1. Name of Medication:   sacubitril -valsartan  (ENTRESTO ) 97-103 MG    2. How are you currently taking this medication (dosage and times per day)? As written   3. Are you having a reaction (difficulty breathing--STAT)? No   4. What is your medication issue? Pt said he has [employer] grant and he has [facility] calling him about the script but he thought it was coming from [employer]. Please advise

## 2024-08-10 NOTE — Telephone Encounter (Signed)
 [employer] replaces the [employer] assistance. The grant has to run secondary to insurance so it should be coming from patient's filling pharmacy.

## 2024-08-10 NOTE — Telephone Encounter (Signed)
 Attempted to call patient x3. It sounds like someone picks up, but no answer then a high-pitched noise comes over the line.

## 2024-08-10 NOTE — Telephone Encounter (Signed)
Patient states that he is returning a call. Please advise 

## 2024-08-10 NOTE — Telephone Encounter (Signed)
 Message sent to our RX Med Assist team.

## 2024-08-13 NOTE — Telephone Encounter (Signed)
 Phone busy.

## 2024-08-14 ENCOUNTER — Other Ambulatory Visit (HOSPITAL_COMMUNITY): Payer: Self-pay

## 2024-08-14 ENCOUNTER — Telehealth: Payer: Self-pay

## 2024-08-14 MED ORDER — SACUBITRIL-VALSARTAN 97-103 MG PO TABS
1.0000 | ORAL_TABLET | Freq: Two times a day (BID) | ORAL | 3 refills | Status: AC
  Filled 2024-08-14 (×2): qty 180, 90d supply, fill #0
  Filled 2024-11-05 (×2): qty 180, 90d supply, fill #1

## 2024-08-14 NOTE — Telephone Encounter (Signed)
 Spoke with pt's wife per [facility] regarding prescription for Entresto . Wife stated her daughter spoke with someone from [employer] at [phone] on Wednesday of last week. Wife stated that they get a 90 day supply of Entresto  from [employer] for free and with [facility] the prescription is $50 for a 30 day supply. Wife was told the information provided would be forwarded to our Rx team for assistance. Wife verbalized understanding. All questions if any were answered.

## 2024-08-14 NOTE — Telephone Encounter (Signed)
 Pharmacy Patient Advocate Encounter  Insurance verification completed.   The patient is insured through [employer] claim for ENTRESTO . Currently a quantity of 60 is a 30 day supply and the co-pay is $0 . The current 30 day co-pay is, $0.  No PA needed at this time.  This test claim was processed through [facility]- copay amounts may vary at other pharmacies due to pharmacy/plan contracts, or as the patient moves through the different stages of their insurance plan.    Copay is $0 with [employer] grant applied

## 2024-08-14 NOTE — Addendum Note (Signed)
 Addended by: Val Farnam N on: 08/14/2024 04:29 PM   Modules accepted: Orders

## 2024-08-14 NOTE — Telephone Encounter (Signed)
 Marval (Pt's daughter) is requesting a callback at [phone] regarding the calls the pt missed. She stated reaching out to pt wouldn't be helpful since he doesn't answer his phone because he can't hear it. Please advise

## 2024-08-14 NOTE — Telephone Encounter (Signed)
 We can have the RX filled at [facility] and delivered to patient instead of [facility]. They did not apply the grant to the claim if patient had a balance.

## 2024-08-14 NOTE — Telephone Encounter (Signed)
 Spoke with pt's daughter (received verbal consent from pt) regarding Entresto . Per our pharmacy tech, the pt will receive free medication from [facility] that will ship to them. A refill was sent to [facility] pharmacy. Pt aware. Daughter verbalized understanding. All questions if any were answered.

## 2024-09-12 ENCOUNTER — Other Ambulatory Visit (HOSPITAL_COMMUNITY): Payer: Self-pay

## 2024-10-10 ENCOUNTER — Ambulatory Visit

## 2024-10-31 ENCOUNTER — Other Ambulatory Visit: Payer: Self-pay | Admitting: *Deleted

## 2024-10-31 DIAGNOSIS — I25810 Atherosclerosis of coronary artery bypass graft(s) without angina pectoris: Secondary | ICD-10-CM

## 2024-11-05 ENCOUNTER — Other Ambulatory Visit (HOSPITAL_COMMUNITY): Payer: Self-pay

## 2024-11-12 NOTE — Progress Notes (Unsigned)
  Cardiology Office Note:   Date:  11/14/2024  ID:  [name], DOB 20-Jul-1946, MRN [id number] PCP: Jolinda Norene CHRISTELLA, DO   HeartCare Providers Cardiologist:  Lynwood Schilling, MD {  History of Present Illness:   [name] is a 78 y.o. male  who presents for follow up of CAD.  He had CABG in July 2010 with a mildly reduced EF.  I sent him for an echocardiogram in February 2025.  Ejection fraction was 35 to 40%.  He had no new symptoms.  At the last visit I increased his Entresto .    He did well with the increase in Entresto  at the last visit. The patient denies any new symptoms such as chest discomfort, neck or arm discomfort. There has been no new shortness of breath, PND or orthopnea. There have been no reported palpitations, presyncope or syncope.  He has a bit of a cough recovering from COVID a couple of weeks ago.  He has been able to be active.  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  ROS: As stated in the HPI and negative for all other systems.  Studies Reviewed:    EKG:     NA   Risk Assessment/Calculations:              Physical Exam:   VS:  BP 128/70   Pulse 76   Ht 5' 10 (1.778 m)   Wt 210 lb (95.3 kg)   BMI 30.13 kg/m    Wt Readings from Last 3 Encounters:  11/14/24 210 lb (95.3 kg)  08/02/24 214 lb 11.2 oz (97.4 kg)  05/30/24 212 lb (96.2 kg)     GEN: Well nourished, well developed in no acute distress NECK: No JVD; No carotid bruits CARDIAC: RRR, no murmurs, rubs, gallops RESPIRATORY:  Clear to auscultation without rales, wheezing or rhonchi  ABDOMEN: Soft, non-tender, non-distended EXTREMITIES:  No edema; No deformity   ASSESSMENT AND PLAN:   HTN -    His blood pressure is at target and being managed in the context of treating his cardiomyopathy.   CAD, AUTOLOGOUS BYPASS GRAFT -  The patient has no new symptoms.  No change in therapy.  HYPERLIPIDEMIA-MIXED -  He is tolerating pravastatin  with an LDL of 47.  No change  in therapy.   CVA -  He had 50 to 69% stenosis of the left carotid in 2024 and will have a repeat early next year.   CARDIOMYPATHY -  He is he is euvolemic.  I will repeat an echocardiogram in the spring of next year.   He does not tolerate beta-blocker.  No change in therapy.    BRADYCARDIA - He has not tolerated beta-blocker in the past so no med titration there.  Follow up with me in March 2026  Signed, Lynwood Schilling, MD

## 2024-11-14 ENCOUNTER — Encounter: Payer: Self-pay | Admitting: Cardiology

## 2024-11-14 ENCOUNTER — Ambulatory Visit: Admitting: Cardiology

## 2024-11-14 VITALS — BP 128/70 | HR 76 | Ht 70.0 in | Wt 210.0 lb

## 2024-11-14 DIAGNOSIS — I428 Other cardiomyopathies: Secondary | ICD-10-CM

## 2024-11-14 DIAGNOSIS — I10 Essential (primary) hypertension: Secondary | ICD-10-CM

## 2024-11-14 DIAGNOSIS — I25810 Atherosclerosis of coronary artery bypass graft(s) without angina pectoris: Secondary | ICD-10-CM

## 2024-11-14 DIAGNOSIS — E785 Hyperlipidemia, unspecified: Secondary | ICD-10-CM | POA: Diagnosis not present

## 2024-11-14 DIAGNOSIS — I6523 Occlusion and stenosis of bilateral carotid arteries: Secondary | ICD-10-CM

## 2024-11-14 DIAGNOSIS — R001 Bradycardia, unspecified: Secondary | ICD-10-CM | POA: Diagnosis not present

## 2024-11-14 NOTE — Patient Instructions (Addendum)
 Medication Instructions:  Your physician recommends that you continue on your current medications as directed. Please refer to the Current Medication list given to you today.  Labwork: none  Testing/Procedures: Your physician has requested that you have a carotid duplex in February 2026. This test is an ultrasound of the carotid arteries in your neck. It looks at blood flow through these arteries that supply the brain with blood. Allow one hour for this exam. There are no restrictions or special instructions. Your physician has requested that you have an echocardiogram in February 2026. Echocardiography is a painless test that uses sound waves to create images of your heart. It provides your doctor with information about the size and shape of your heart and how well your heart's chambers and valves are working. This procedure takes approximately one hour. There are no restrictions for this procedure. Please do NOT wear cologne, perfume, aftershave, or lotions (deodorant is allowed). Please arrive 15 minutes prior to your appointment time.  Please note: We ask at that you not bring children with you during ultrasound (echo/ vascular) testing. Due to room size and safety concerns, children are not allowed in the ultrasound rooms during exams. Our front office staff cannot provide observation of children in our lobby area while testing is being conducted. An adult accompanying a patient to their appointment will only be allowed in the ultrasound room at the discretion of the ultrasound technician under special circumstances. We apologize for any inconvenience.  Follow-Up: Your physician recommends that you schedule a follow-up appointment in: March 2026  Any Other Special Instructions Will Be Listed Below (If Applicable).  If you need a refill on your cardiac medications before your next appointment, please call your pharmacy.

## 2025-01-12 ENCOUNTER — Other Ambulatory Visit: Payer: Self-pay | Admitting: Cardiology

## 2025-01-12 DIAGNOSIS — I25810 Atherosclerosis of coronary artery bypass graft(s) without angina pectoris: Secondary | ICD-10-CM

## 2025-01-12 DIAGNOSIS — E785 Hyperlipidemia, unspecified: Secondary | ICD-10-CM

## 2025-01-14 ENCOUNTER — Encounter: Payer: Medicare Other | Admitting: Family Medicine

## 2025-01-14 NOTE — Progress Notes (Unsigned)
 "  [name] is a 79 y.o. male presents to office today for annual physical exam examination.     ***   Occupation: ***, Marital status: ***, Substance use: *** Health Maintenance Due  Topic Date Due   Zoster Vaccines- Shingrix (1 of 2) Never done   Medicare Annual Wellness (AWV)  01/04/2024   Colonoscopy  01/13/2024   Influenza Vaccine  07/27/2024   COVID-19 Vaccine (4 - 2025-26 season) 08/27/2024    Immunization History  Administered Date(s) Administered   Fluad Quad(high Dose 65+) 11/24/2020   INFLUENZA, HIGH DOSE SEASONAL PF 01/09/2018, 10/26/2018, 11/06/2019   Influenza,inj,Quad PF,6+ Mos 10/22/2013, 10/01/2014, 12/17/2015   Influenza-Unspecified 01/09/2018, 10/26/2018, 11/06/2019, 09/26/2021   Moderna Sars-Covid-2 Vaccination 03/01/2020, 04/02/2020, 12/18/2020   Pneumococcal Conjugate-13 10/22/2013   Pneumococcal Polysaccharide-23 11/03/2010   Td 12/29/2022   Tdap 09/17/2011   Past Medical History:  Diagnosis Date   Acid reflux    Carotid artery occlusion    right carotid stenting in 2007   Chest pain    CHEST PAIN-UNSPECIFIED 06/20/2009   Qualifier: Diagnosis of   By: Wynetta, CMA, Carol       Coronary artery disease    a. 2010 --> cath showing LM and 3-vessel CAD --> s/p CABG with LIMA-LAD, SVG-D1, SVG-OM2 and SVD-PDA   CVA (cerebral vascular accident) (HCC)    Hyperlipidemia    Hypertension    Obesity    Social History   Socioeconomic History   Marital status: Married    Spouse name: Erminio   Number of children: 2   Years of education: Not on file   Highest education level: Not on file  Occupational History   Occupation: Retired  Tobacco Use   Smoking status: Former    Current packs/day: 0.00    Average packs/day: 1.5 packs/day for 41.0 years (61.5 ttl pk-yrs)    Types: Cigarettes    Start date: 02/04/1965    Quit date: 12/27/2005    Years since quitting: 19.0   Smokeless tobacco: Never  Vaping Use   Vaping status: Never Used  Substance and Sexual  Activity   Alcohol use: No    Alcohol/week: 0.0 standard drinks of alcohol   Drug use: No   Sexual activity: Not Currently  Other Topics Concern   Not on file  Social History Narrative   Daughter lives with them   Son lives in [place]   Social Drivers of Health   Tobacco Use: Medium Risk (11/14/2024)   Patient History    Smoking Tobacco Use: Former    Smokeless Tobacco Use: Never    Passive Exposure: Not on Actuary Strain: High Risk (01/13/2024)   Overall Financial Resource Strain (CARDIA)    Difficulty of Paying Living Expenses: Very hard  Food Insecurity: No Food Insecurity (01/13/2024)   Hunger Vital Sign    Worried About Running Out of Food in the Last Year: Never true    Ran Out of Food in the Last Year: Never true  Transportation Needs: No Transportation Needs (01/13/2024)   PRAPARE - Administrator, Civil Service (Medical): No    Lack of Transportation (Non-Medical): No  Physical Activity: Inactive (01/13/2024)   Exercise Vital Sign    Days of Exercise per Week: 0 days    Minutes of Exercise per Session: 0 min  Stress: No Stress Concern Present (01/13/2024)   [employer] of Occupational Health - Occupational Stress Questionnaire    Feeling of Stress : Not at  all  Social Connections: Socially Integrated (01/03/2023)   Social Connection and Isolation Panel    Frequency of Communication with Friends and Family: More than three times a week    Frequency of Social Gatherings with Friends and Family: More than three times a week    Attends Religious Services: More than 4 times per year    Active Member of [employer] or Organizations: Yes    Attends Banker Meetings: More than 4 times per year    Marital Status: Married  Catering Manager Violence: Not At Risk (01/13/2024)   Humiliation, Afraid, Rape, and Kick questionnaire    Fear of Current or Ex-Partner: No    Emotionally Abused: No    Physically Abused: No    Sexually Abused:  No  Depression (PHQ2-9): Low Risk (01/13/2024)   Depression (PHQ2-9)    PHQ-2 Score: 0  Alcohol Screen: Low Risk (01/13/2024)   Alcohol Screen    Last Alcohol Screening Score (AUDIT): 0  Housing: Unknown (01/13/2024)   Housing Stability Vital Sign    Unable to Pay for Housing in the Last Year: No    Number of Times Moved in the Last Year: Not on file    Homeless in the Last Year: No  Utilities: Not At Risk (01/13/2024)   AHC Utilities    Threatened with loss of utilities: No  Health Literacy: Adequate Health Literacy (01/13/2024)   B1300 Health Literacy    Frequency of need for help with medical instructions: Rarely   Past Surgical History:  Procedure Laterality Date   CORONARY ARTERY BYPASS GRAFT     TONSILLECTOMY     Family History  Problem Relation Age of Onset   Heart attack Father    Heart attack Brother    Current Medications[1]  Allergies[2]   ROS: Review of Systems {ros; complete:30496}    Physical exam {Exam, Complete:[phone]}      01/13/2024    3:23 PM 01/03/2023    2:04 PM 12/29/2022    2:08 PM  Depression screen PHQ 2/9  Decreased Interest 0 0 0  Down, Depressed, Hopeless 0 0 0  PHQ - 2 Score 0 0 0  Altered sleeping 0 0 0  Tired, decreased energy 0 0 0  Change in appetite 0 0 0  Feeling bad or failure about yourself  0 0 0  Trouble concentrating 0 0 0  Moving slowly or fidgety/restless 0 0 0  Suicidal thoughts 0 0 0  PHQ-9 Score 0  0  0   Difficult doing work/chores Not difficult at all Not difficult at all Not difficult at all     Data saved with a previous flowsheet row definition      01/13/2024    3:23 PM 12/29/2022    2:09 PM 03/16/2022    2:15 PM 08/10/2019   10:04 AM  GAD 7 : Generalized Anxiety Score  Nervous, Anxious, on Edge 0 0 0 0  Control/stop worrying 0 0 0 0  Worry too much - different things 0 0 0 0  Trouble relaxing 0 0 0 0  Restless 0 0 0 0  Easily annoyed or irritable 0 0 0 0  Afraid - awful might happen 0 0 0 0  Total GAD  7 Score 0 0 0 0  Anxiety Difficulty  Not difficult at all Not difficult at all Not difficult at all    No results found for this or any previous visit (from the past 2160 hours).   Assessment/ Plan:  [name] here for annual physical exam.   Annual physical exam  Screen for colon cancer  Coronary atherosclerosis of autologous vein bypass graft without angina  Essential hypertension  Benign prostatic hyperplasia without lower urinary tract symptoms  PVD (peripheral vascular disease)   ***  Counseled on healthy lifestyle choices, including diet (rich in fruits, vegetables and lean meats and low in salt and simple carbohydrates) and exercise (at least 30 minutes of moderate physical activity daily).  Patient to follow up ***  Manley Fason M. Robie Oats, DO        [1]  Current Outpatient Medications:    aspirin 81 MG tablet, Take 81 mg by mouth daily.  , Disp: , Rfl:    b complex vitamins capsule, Take 1 capsule by mouth daily., Disp: , Rfl:    cholecalciferol (VITAMIN D3) 25 MCG (1000 UNIT) tablet, Take 1,000 Units by mouth daily., Disp: , Rfl:    citalopram  (CELEXA ) 20 MG tablet, Take 1 tablet (20 mg total) by mouth daily., Disp: 100 tablet, Rfl: 3   ezetimibe  (ZETIA ) 10 MG tablet, Take 1 tablet (10 mg total) by mouth daily. (Patient not taking: Reported on 08/02/2024), Disp: 30 tablet, Rfl: 11   nitroGLYCERIN  (NITROSTAT ) 0.4 MG SL tablet, DISSOLVE ONE TABLET UNDER THE TONGUE EVERY 5 MINUTES AS NEEDED FOR CHEST PAIN.  DO NOT EXCEED A TOTAL OF 3 DOSES IN 15 MINUTES, Disp: 25 tablet, Rfl: 1   pravastatin  (PRAVACHOL ) 80 MG tablet, Take 1 tablet (80 mg total) by mouth daily., Disp: 100 tablet, Rfl: 3   sacubitril -valsartan  (ENTRESTO ) 97-103 MG, Take 1 tablet by mouth 2 (two) times daily., Disp: 180 tablet, Rfl: 3   vitamin C (ASCORBIC ACID) 500 MG tablet, Take 500 mg by mouth daily., Disp: , Rfl:  [2]  Allergies Allergen Reactions   Atorvastatin  Other (See Comments)    Muscle  aches   Crestor [Rosuvastatin Calcium ] Other (See Comments)    Muscle aches   Iodinated Contrast Media Rash    rash   Niaspan [Niacin Er (Antihyperlipidemic)] Other (See Comments)    Muscle aches   "

## 2025-01-15 ENCOUNTER — Encounter: Payer: Self-pay | Admitting: Family Medicine

## 2025-01-25 ENCOUNTER — Ambulatory Visit: Payer: Self-pay | Admitting: Cardiology

## 2025-01-25 ENCOUNTER — Ambulatory Visit (INDEPENDENT_AMBULATORY_CARE_PROVIDER_SITE_OTHER)

## 2025-01-25 ENCOUNTER — Ambulatory Visit: Attending: Cardiology

## 2025-01-25 DIAGNOSIS — I25810 Atherosclerosis of coronary artery bypass graft(s) without angina pectoris: Secondary | ICD-10-CM

## 2025-01-25 DIAGNOSIS — I6523 Occlusion and stenosis of bilateral carotid arteries: Secondary | ICD-10-CM

## 2025-01-25 DIAGNOSIS — I428 Other cardiomyopathies: Secondary | ICD-10-CM

## 2025-01-25 LAB — ECHOCARDIOGRAM COMPLETE
AR max vel: 2.97 cm2
AV Area VTI: 2.81 cm2
AV Area mean vel: 2.73 cm2
AV Mean grad: 4 mmHg
AV Peak grad: 7.1 mmHg
Ao pk vel: 1.33 m/s
Area-P 1/2: 5.42 cm2
Calc EF: 39.9 %
S' Lateral: 4 cm
Single Plane A2C EF: 35.8 %
Single Plane A4C EF: 42.8 %

## 2025-01-25 MED ORDER — PERFLUTREN LIPID MICROSPHERE
1.0000 mL | INTRAVENOUS | Status: AC | PRN
  Administered 2025-01-25: 3 mL via INTRAVENOUS

## 2025-01-29 ENCOUNTER — Ambulatory Visit

## 2025-03-13 ENCOUNTER — Ambulatory Visit: Admitting: Cardiology
